# Patient Record
Sex: Female | Born: 1995
Health system: Southern US, Community
[De-identification: ages and names within clinical notes are randomized; demographics above are authoritative.]

## PROBLEM LIST (undated history)

## (undated) DIAGNOSIS — R519 Headache, unspecified: Secondary | ICD-10-CM

## (undated) DIAGNOSIS — Z789 Other specified health status: Secondary | ICD-10-CM

## (undated) DIAGNOSIS — S329XXA Fracture of unspecified parts of lumbosacral spine and pelvis, initial encounter for closed fracture: Secondary | ICD-10-CM

## (undated) DIAGNOSIS — L509 Urticaria, unspecified: Secondary | ICD-10-CM

## (undated) DIAGNOSIS — N8000 Endometriosis of the uterus, unspecified: Secondary | ICD-10-CM

## (undated) DIAGNOSIS — I4891 Unspecified atrial fibrillation: Secondary | ICD-10-CM

## (undated) DIAGNOSIS — I10 Essential (primary) hypertension: Secondary | ICD-10-CM

## (undated) DIAGNOSIS — O210 Mild hyperemesis gravidarum: Secondary | ICD-10-CM

## (undated) HISTORY — PX: FOOT SURGERY: SHX648

## (undated) HISTORY — DX: Essential (primary) hypertension: I10

## (undated) HISTORY — DX: Urticaria, unspecified: L50.9

---
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
## 2014-06-04 ENCOUNTER — Inpatient Hospital Stay (HOSPITAL_COMMUNITY): Payer: Federal, State, Local not specified - PPO

## 2014-06-04 ENCOUNTER — Encounter (HOSPITAL_COMMUNITY): Payer: Self-pay | Admitting: *Deleted

## 2014-06-04 ENCOUNTER — Other Ambulatory Visit: Payer: Self-pay | Admitting: Obstetrics and Gynecology

## 2014-06-04 ENCOUNTER — Inpatient Hospital Stay (HOSPITAL_COMMUNITY)
Admission: AD | Admit: 2014-06-04 | Discharge: 2014-06-04 | Disposition: A | Discharge status: Home / Self Care | Payer: Federal, State, Local not specified - PPO | Source: Ambulatory Visit | Attending: Obstetrics and Gynecology | Admitting: Obstetrics and Gynecology

## 2014-06-04 DIAGNOSIS — R1030 Lower abdominal pain, unspecified: Secondary | ICD-10-CM

## 2014-06-04 DIAGNOSIS — Z975 Presence of (intrauterine) contraceptive device: Secondary | ICD-10-CM | POA: Diagnosis not present

## 2014-06-04 DIAGNOSIS — R109 Unspecified abdominal pain: Secondary | ICD-10-CM | POA: Insufficient documentation

## 2014-06-04 MED ORDER — MORPHINE SULFATE 4 MG/ML IJ SOLN
2.0000 mg | INTRAMUSCULAR | Status: DC | PRN
  Administered 2014-06-04: 2 mg via INTRAVENOUS
  Filled 2014-06-04: qty 1

## 2014-06-04 MED ORDER — PROMETHAZINE HCL 25 MG RE SUPP: 25.0000 mg | Freq: Four times a day (QID) | RECTAL | Status: DC | PRN

## 2014-06-04 MED ORDER — LACTATED RINGERS IV BOLUS (SEPSIS)
1000.0000 mL | Freq: Once | INTRAVENOUS | Status: AC
  Administered 2014-06-04: 1000 mL via INTRAVENOUS

## 2014-06-04 MED ORDER — PROMETHAZINE HCL 25 MG/ML IJ SOLN
25.0000 mg | Freq: Four times a day (QID) | INTRAMUSCULAR | Status: DC | PRN
  Administered 2014-06-04: 25 mg via INTRAVENOUS
  Filled 2014-06-04: qty 1

## 2014-06-04 MED ORDER — PROMETHAZINE HCL 25 MG PO TABS: 25.0000 mg | ORAL_TABLET | Freq: Four times a day (QID) | ORAL | Status: DC | PRN

## 2014-06-04 NOTE — MAU Note (Signed)
Pt presents from Dr. Ginnie SmartVarnado's office with abdominal pain following the placement of a Skyla IUD. States the pain started immediately when placed and is having spotting. Was given Tylenol and Toradol at 1245 but it isn't helping.

## 2014-06-04 NOTE — MAU Provider Note (Signed)
CC: Abdominal Pain    First Provider Initiated Contact with Patient 06/04/14 1200      HPI Luanna ColeBrook K Fairbairn is a 19 y.o. G0P0 who was sent from office visit after Skylar IUD insertion today. Following this insertion she began having pain, nausea and vomiting, diaphoresis and had syncopal episode while there. She received Tylenol and Toradol at 1245 at the office.    Past Medical History  Diagnosis Date  . Asthma   . Fractured pelvis     OB History  Gravida Para Term Preterm AB SAB TAB Ectopic Multiple Living  0 0 0 0 0 0 0 0 0 0         Past Surgical History  Procedure Laterality Date  . Foot surgery      History   Social History  . Marital Status: Single    Spouse Name: N/A    Number of Children: N/A  . Years of Education: N/A   Occupational History  . Not on file.   Social History Main Topics  . Smoking status: Never Smoker   . Smokeless tobacco: Not on file  . Alcohol Use: No  . Drug Use: No  . Sexual Activity: Yes    Birth Control/ Protection: IUD   Other Topics Concern  . Not on file   Social History Narrative    No current facility-administered medications on file prior to encounter.   Current Outpatient Prescriptions on File Prior to Encounter  Medication Sig Dispense Refill  . albuterol (PROVENTIL HFA;VENTOLIN HFA) 108 (90 BASE) MCG/ACT inhaler Inhale 2 puffs into the lungs every 6 (six) hours as needed. For shortness of breath    . ergocalciferol (VITAMIN D2) 50000 UNITS capsule Take 50,000 Units by mouth as directed. Patient takes 1 capsule every two weeks. No specific day.    . fluticasone (FLOVENT HFA) 220 MCG/ACT inhaler Inhale 1 puff into the lungs 2 (two) times daily as needed. For shortness of breath    . montelukast (SINGULAIR) 10 MG tablet Take 10 mg by mouth daily.    . promethazine (PHENERGAN) 25 MG tablet Take 1 tablet (25 mg total) by mouth every 6 (six) hours as needed for nausea. 12 tablet 0  . HYDROcodone-acetaminophen (NORCO/VICODIN)  5-325 MG per tablet Take 2 tablets by mouth every 4 (four) hours as needed for pain. (Patient not taking: Reported on 06/04/2014) 10 tablet 0    Allergies  Allergen Reactions  . Ibuprofen Nausea And Vomiting     ROS Pertinent items in HPI   PHYSICAL EXAM Filed Vitals:   06/04/14 1116  BP: 113/59  Pulse: 73  Temp: 98 F (36.7 C)  Resp: 73   General: Well nourished, well developed female in no acute distress Cardiovascular: Normal rate Respiratory: Normal effort Abdomen: Soft, nontender Back: No CVAT Extremities: No edema Neurologic:grossly intact  LAB RESULTS No results found for this or any previous visit (from the past 24 hour(s)).  IMAGING Prelim: IUD in correct place  MAU COURSE First saw pt after MSO4 2GM and Phenergan 25mg  IV give> somnolent Dr. Dion BodyVarnado to see pt and do disposition  ASSESSMENT  1. Abdominal pain     PLAN  Danae OrleansDeirdre C Poe, CNM 06/04/2014 12:08 PM

## 2014-06-04 NOTE — MAU Note (Signed)
Pt's mother reports pt "feel out" in Varnado's office today after IUD being placed.  Mother states she did have a syncopal episode but did not strike anything when she slid to the ground.  Pt continues to vomit clear/yellowish fluid.

## 2014-06-26 ENCOUNTER — Inpatient Hospital Stay (HOSPITAL_COMMUNITY)
Admission: AD | Admit: 2014-06-26 | Discharge: 2014-06-26 | Disposition: A | Discharge status: Home / Self Care | Payer: Federal, State, Local not specified - PPO | Source: Ambulatory Visit | Attending: Obstetrics and Gynecology | Admitting: Obstetrics and Gynecology

## 2014-06-26 ENCOUNTER — Encounter (HOSPITAL_COMMUNITY): Payer: Self-pay | Admitting: *Deleted

## 2014-06-26 DIAGNOSIS — R112 Nausea with vomiting, unspecified: Secondary | ICD-10-CM | POA: Insufficient documentation

## 2014-06-26 DIAGNOSIS — N946 Dysmenorrhea, unspecified: Secondary | ICD-10-CM | POA: Diagnosis not present

## 2014-06-26 DIAGNOSIS — R1030 Lower abdominal pain, unspecified: Secondary | ICD-10-CM | POA: Insufficient documentation

## 2014-06-26 DIAGNOSIS — Z975 Presence of (intrauterine) contraceptive device: Secondary | ICD-10-CM | POA: Insufficient documentation

## 2014-06-26 LAB — URINALYSIS, ROUTINE W REFLEX MICROSCOPIC
BILIRUBIN URINE: NEGATIVE
GLUCOSE, UA: NEGATIVE mg/dL
KETONES UR: NEGATIVE mg/dL
LEUKOCYTES UA: NEGATIVE
Nitrite: NEGATIVE
PH: 6 (ref 5.0–8.0)
Protein, ur: 30 mg/dL — AB
Urobilinogen, UA: 0.2 mg/dL (ref 0.0–1.0)

## 2014-06-26 LAB — URINE MICROSCOPIC-ADD ON

## 2014-06-26 LAB — POCT PREGNANCY, URINE: Preg Test, Ur: NEGATIVE

## 2014-06-26 MED ORDER — OXYCODONE-ACETAMINOPHEN 5-325 MG PO TABS: 1.0000 | ORAL_TABLET | ORAL | Status: DC | PRN

## 2014-06-26 MED ORDER — KETOROLAC TROMETHAMINE 10 MG PO TABS: 10.0000 mg | ORAL_TABLET | Freq: Four times a day (QID) | ORAL | Status: DC

## 2014-06-26 MED ORDER — PROMETHAZINE HCL 25 MG RE SUPP: 25.0000 mg | Freq: Four times a day (QID) | RECTAL | Status: DC | PRN

## 2014-06-26 MED ORDER — MEDROXYPROGESTERONE ACETATE 150 MG/ML IM SUSP
150.0000 mg | Freq: Once | INTRAMUSCULAR | Status: AC
  Administered 2014-06-26: 150 mg via INTRAMUSCULAR
  Filled 2014-06-26: qty 1

## 2014-06-26 MED ORDER — KETOROLAC TROMETHAMINE 60 MG/2ML IM SOLN
60.0000 mg | Freq: Once | INTRAMUSCULAR | Status: AC
  Administered 2014-06-26: 60 mg via INTRAMUSCULAR
  Filled 2014-06-26: qty 2

## 2014-06-26 MED ORDER — PROMETHAZINE HCL 25 MG/ML IJ SOLN
25.0000 mg | Freq: Once | INTRAMUSCULAR | Status: AC
  Administered 2014-06-26: 25 mg via INTRAVENOUS
  Filled 2014-06-26: qty 1

## 2014-06-26 MED ORDER — MORPHINE SULFATE 4 MG/ML IJ SOLN
4.0000 mg | Freq: Once | INTRAMUSCULAR | Status: AC
  Administered 2014-06-26: 4 mg via INTRAVENOUS
  Filled 2014-06-26: qty 1

## 2014-06-26 MED ORDER — MEDROXYPROGESTERONE ACETATE 150 MG/ML IM SUSP: 150.0000 mg | Freq: Once | INTRAMUSCULAR | Status: DC

## 2014-06-26 NOTE — MAU Provider Note (Signed)
History     CSN: [id number]  Arrival date & time 06/26/14  [id number]   First Provider Initiated Contact with Patient 06/26/14 1949      No chief complaint on file.   HPI 19 y/o G0 presents with c/o lower abdominal pain and nausea.    Pt has a h/o severe dysmenorrhea which includes intractable vomiting and severe abdominal pain.  Pt was amenorrheic on Depo Provera but did not like side effects.  Recommended Mirena IUD.  IUD was placed on 06/04/2014 without complication but pt had severe pain, menstrual cramps, and vomiting.  Pt was diaphoretic in office.  Pt had been premedicated with Valium, Percocet and Cytotec at that time. Toradol 60 mg given in office without relief of pain. Pt sent to ER.  Pain and nausea resolved with Morphine and Phenergan.  [facility] done at [facility] confirmed adequate placement, no perforation. Pt was seen in office 1 week after placement.  She reported occasional cramping but that was controlled with Midol.  Strings shorter than expected but pt was without uterine tenderness. Pt reports that the cramping increased 5 days ago but was controlled with Midol.  Today while driving, she had a sudden onset of pain and nausea and cramping resumed.  Pt states she contemplated calling the ER due to the severity of the pain.  Denies fever/chills.   Agreeable to removing IUD and resuming Depo Provera.  Needs contraception.  Past Medical History  Diagnosis Date  . Asthma   . Fractured pelvis     Past Surgical History  Procedure Laterality Date  . Foot surgery      History reviewed. No pertinent family history.  History  Substance Use Topics  . Smoking status: Never Smoker   . Smokeless tobacco: Not on file  . Alcohol Use: No    OB History    Gravida Para Term Preterm AB TAB SAB Ectopic Multiple Living   0 0 0 0 0 0 0 0 0 0       Review of Systems  Allergies  Ibuprofen  Home Medications  No current outpatient prescriptions on file.  BP 136/83 mmHg   Pulse 108  Temp(Src) 98.1 F (36.7 C) (Oral)  Resp 19  SpO2 100%  LMP  (LMP Unknown)  Physical Exam  Constitutional: She is oriented to person, place, and time. She appears well-developed and well-nourished. She appears distressed.  Appears to have cyclic pain.  Vomiting.  Shivering.  HENT:  Head: Normocephalic and atraumatic.  Neck: Normal range of motion.  Abdominal: Soft. There is tenderness. There is no rebound and no guarding.  Genitourinary: There is no rash or lesion on the right labia. There is no rash or lesion on the left labia. Cervix exhibits no discharge and no friability. No vaginal discharge found.  Bimanual exam deferred.  Neurological: She is alert and oriented to person, place, and time.  Skin: Skin is warm and dry.    MAU Course  Procedures (including critical care time)  I personally did a limited bedside ultrasound done..  Small uterus noted with IUD correctly positioned in endometrial cavity.  No free fluid.  Labs Reviewed  URINALYSIS, ROUTINE W REFLEX MICROSCOPIC - Abnormal; Notable for the following:    Specific Gravity, Urine >1.030 (*)    Hgb urine dipstick LARGE (*)    Protein, ur 30 (*)    All other components within normal limits  URINE MICROSCOPIC-ADD ON - Abnormal; Notable for the following:    Squamous Epithelial /  LPF FEW (*)    Bacteria, UA FEW (*)    All other components within normal limits  POCT PREGNANCY, URINE   No results found.   No diagnosis found.    MDM  Premedicate pt with Toradol 60 mg IM and Phenergan 25 mg IV.  Morphine Sulfate IV if pain s/p removal. Check GC/Chl.   Pt felt that the pain was bearable after the Phenergan and Toradol but she vomited shortly after getting IV Phenergan.  Pt given Morphine 4 mg IV prior to IUD removal.  Pt had pain with a speculum exam, which is pt's baseline.  IUD strings not easily visualized.  Ringed forceps used to blindly grasp strings.  IUD easily removed. Pt wit moderate to severe  pain but pt seemed to appear more comfortable within 5 minutes of removal.    A: Severe Dysmenorrhea exacerbated by IUD. Now s/p removal.   Nausea/vomiting.  P: Discharge home with po Toradol 10 mg q 6 hours x 2 days prn pain. Percocet #10 prn severe pain. Phenergan 25 mg suppositories.  Pt with h/o vomiting Zofran ODT. Depo Provera prior to discharge to defer menses and contraception.  Pt previously tolerated prolonged vaginal bleeding without cramping. F/u in office in 1 week. Plan diagnostic laparoscopy to rule out endometriosis.

## 2014-06-26 NOTE — Discharge Instructions (Signed)

## 2014-06-26 NOTE — MAU Note (Signed)
Pt reports she had an IUD put in on 06/04/2014, states she started having vomiting 45 minutes ago, lower abd pain started 45 minutes ago. States she was seen here right after the IUD was put in and was seen here the day it was placed.

## 2014-06-26 NOTE — MAU Note (Signed)
PT SAYS  SHE HAD  DEPO IN NOV 2015-   THEN  DR VARNADO   PUT  IN IUD ON 2-1-   PT  CAME  TO HOSPITAL  ON 2-1.  WE GAVE  HER PHENERGAN  AND MORPHINE   THEN WENT  HOME.   AND SHE HAS CONTINUED  WITH MILD CRAMPS .  SHE WAS IN OFFICE ON Thursday- SAID STRING WAS SHORTER-   TOLD  TO RETURN IN 4 WEEKS-  TO RECHECK  STRING.     TODAY  CRAMPING  BECAME   WORSE AT 6PM-    SHE TOOK ZOFRAN AND  1 MIDOL-   VOMITED.      AT  HOME - SHE SAW BLOOD WHEN SHE WIPED-   SHE DID NOT    WEAR UNDERWEAR-   .   NO BLOOD ON PERINEUM.   SAYS SHE VOMITED IN TRIAGE-.  ATE LAST  AT 5PM.  - CHICKEN STRIPS.

## 2014-06-27 LAB — GC/CHLAMYDIA PROBE AMP (~~LOC~~) NOT AT ARMC
CHLAMYDIA, DNA PROBE: NEGATIVE
Neisseria Gonorrhea: NEGATIVE

## 2014-11-07 ENCOUNTER — Encounter (HOSPITAL_COMMUNITY): Payer: Self-pay | Admitting: Anesthesiology

## 2014-11-07 NOTE — H&P (Signed)
   History of Present Illness  General:  19 y/o presents to discuss diagnostic laparoscopy for severe dysmenorrhea. Pt use to go to ER regularly for pain medicaiton b/c pain was so bad. Depo Provera stopped menses and controlled symptoms. Trial of IUD caused severe pain like with menses previously and had to be removed in the ER. Pt is now back on Depo. suspect endometriosis. Appetite is decreased, lost 15 lbs since skyla. Nausea with eating, feels likes needs to eat slowly. Feels full faster. Has not taken OTC medications.   Current Medications  Taking   Differin(Adapalene) 0.1 % Gel 1 application to affected area at bedtime Once a day, Notes: prn   Clindamycin Phosphate 1 % Gel 1 application a thin film to affected area Twice a day, Notes: prn   Depo-Provera(Medroxyprogesterone Acetate) 150 MG/ML Suspension 1 ml Every 3 months   Medication List reviewed and reconciled with the patient    Past Medical History  Liver disorder-abnormal LFTs   Surgical History  surgery on both feet 2012   Family History  Mother: alive, diagnosed with HTN  Maternal Grand Mother: diagnosed with Breast Ca   Social History  General:  Tobacco use  cigarettes: Never smoked Tobacco history last updated 11/02/2014 no Smoking.  no Alcohol.  Caffeine: yes, soda, coffee, tea.  no Recreational drug use.  Exercise: yes.  Occupation: employed, [employer].  Education: yes.  Marital Status: single.  Children: none.    Gyn History  Sexual activity currently sexually active.  LMP none with Depo.  Birth control Depo Provera.  Denies H/O Last pap smear date.  Denies H/O Last mammogram date.  Denies H/O Abnormal pap smear.  Denies H/O STD.    OB History  Never been pregnant per patient.    Allergies  Ibuprofen: pain   Hospitalization/Major Diagnostic Procedure  Denies Past Hospitalization   Review of Systems  Denies fever/chills, chest pain, SOB, headaches, numbness/tingling. No h/o  complication with anesthesia, bleeding disorders or blood clots.   Vital Signs  Wt 158, Wt change -16 lb, Ht 65.5, BMI 25.89, Pulse sitting 121, BP sitting 106/72.   Physical Examination  GENERAL:  Patient appears alert and oriented.  General Appearance: well-appearing, well-developed, no acute distress.  Speech: clear.  LUNGS:  Auscultation: no wheezing/rhonchi/rales. CTA bilaterally.  HEART:  Heart sounds: normal. RRR. no murmur.  ABDOMEN:  General: soft nontender, nondistended, no masses.  FEMALE GENITOURINARY:  Pelvic Not examined.  EXTREMITIES:  General: No edema or calf tenderness.     Assessments   1. Pre-operative clearance - Z01.818 (Primary)   2. Dysmenorrhea - N94.6   Treatment  1. Pre-operative clearance  Notes: D/w pt likelihood of severe adhesion which may limit what can be done at time of surgery. Referral if Stage 3 or 4 endometriosis noted. Risk of injury, especially due to severe adhesive disease reviewed at length. Informed of possible ablation of endometriosis if present.    2. Dysmenorrhea  Referral ZO:XWRUEATo:[facility] - Gynecology Reason:Precert Dx laparoscopy, possible ablation of endometriosis.Need assistant, no preop or surgical clearance needed    Follow Up  2 Weeks post op

## 2014-11-07 NOTE — Anesthesia Preprocedure Evaluation (Addendum)
Anesthesia Evaluation  Patient identified by MRN, date of birth, ID band Patient awake    Reviewed: Allergy & Precautions, NPO status , Patient's Chart, lab work & pertinent test results  Airway Mallampati: II  TM Distance: >3 FB Neck ROM: Full    Dental no notable dental hx. (+) Dental Advisory Given   Pulmonary asthma ,  breath sounds clear to auscultation  Pulmonary exam normal       Cardiovascular negative cardio ROS Normal cardiovascular examRhythm:Regular Rate:Normal     Neuro/Psych negative neurological ROS  negative psych ROS   GI/Hepatic negative GI ROS, Neg liver ROS,   Endo/Other  negative endocrine ROS  Renal/GU negative Renal ROS  negative genitourinary   Musculoskeletal Hx/o Fx pelvis   Abdominal   Peds  Hematology   Anesthesia Other Findings   Reproductive/Obstetrics Dysmenorrhea                            Anesthesia Physical Anesthesia Plan  ASA: II  Anesthesia Plan: General   Post-op Pain Management:    Induction: Intravenous  Airway Management Planned: Oral ETT  Additional Equipment:   Intra-op Plan:   Post-operative Plan: Extubation in OR  Informed Consent: I have reviewed the patients History and Physical, chart, labs and discussed the procedure including the risks, benefits and alternatives for the proposed anesthesia with the patient or authorized representative who has indicated his/her understanding and acceptance.   Dental advisory given  Plan Discussed with: Anesthesiologist, CRNA and Surgeon  Anesthesia Plan Comments:        Anesthesia Quick Evaluation

## 2014-11-08 ENCOUNTER — Ambulatory Visit (HOSPITAL_COMMUNITY): Payer: Federal, State, Local not specified - PPO | Admitting: Anesthesiology

## 2014-11-08 ENCOUNTER — Encounter (HOSPITAL_COMMUNITY): Payer: Self-pay | Admitting: *Deleted

## 2014-11-08 ENCOUNTER — Encounter (HOSPITAL_COMMUNITY)
Admission: RE | Disposition: A | Discharge status: Home / Self Care | Payer: Self-pay | Source: Ambulatory Visit | Attending: Obstetrics and Gynecology

## 2014-11-08 ENCOUNTER — Ambulatory Visit (HOSPITAL_COMMUNITY)
Admission: RE | Admit: 2014-11-08 | Discharge: 2014-11-08 | Disposition: A | Discharge status: Home / Self Care | Payer: Federal, State, Local not specified - PPO | Source: Ambulatory Visit | Attending: Obstetrics and Gynecology | Admitting: Obstetrics and Gynecology

## 2014-11-08 DIAGNOSIS — J45909 Unspecified asthma, uncomplicated: Secondary | ICD-10-CM | POA: Diagnosis not present

## 2014-11-08 DIAGNOSIS — Z886 Allergy status to analgesic agent status: Secondary | ICD-10-CM | POA: Insufficient documentation

## 2014-11-08 DIAGNOSIS — N802 Endometriosis of fallopian tube: Secondary | ICD-10-CM | POA: Diagnosis not present

## 2014-11-08 DIAGNOSIS — N803 Endometriosis of pelvic peritoneum: Secondary | ICD-10-CM | POA: Insufficient documentation

## 2014-11-08 DIAGNOSIS — N946 Dysmenorrhea, unspecified: Secondary | ICD-10-CM | POA: Diagnosis present

## 2014-11-08 HISTORY — DX: Other specified health status: Z78.9

## 2014-11-08 HISTORY — PX: LAPAROSCOPY: SHX197

## 2014-11-08 HISTORY — PX: CHROMOPERTUBATION: SHX6288

## 2014-11-08 HISTORY — PX: ABLATION ON ENDOMETRIOSIS: SHX5787

## 2014-11-08 LAB — ABO/RH: ABO/RH(D): O POS

## 2014-11-08 LAB — PREGNANCY, URINE: PREG TEST UR: NEGATIVE

## 2014-11-08 LAB — CBC
HCT: 38.4 % (ref 36.0–46.0)
Hemoglobin: 13.2 g/dL (ref 12.0–15.0)
MCH: 26.2 pg (ref 26.0–34.0)
MCHC: 34.4 g/dL (ref 30.0–36.0)
MCV: 76.2 fL — AB (ref 78.0–100.0)
Platelets: 295 10*3/uL (ref 150–400)
RBC: 5.04 MIL/uL (ref 3.87–5.11)
RDW: 14.3 % (ref 11.5–15.5)
WBC: 9.7 10*3/uL (ref 4.0–10.5)

## 2014-11-08 LAB — TYPE AND SCREEN
ABO/RH(D): O POS
ANTIBODY SCREEN: NEGATIVE

## 2014-11-08 SURGERY — LAPAROSCOPY, DIAGNOSTIC
Anesthesia: General

## 2014-11-08 MED ORDER — ONDANSETRON HCL 4 MG/2ML IJ SOLN
INTRAMUSCULAR | Status: AC
  Filled 2014-11-08: qty 2

## 2014-11-08 MED ORDER — DEXAMETHASONE SODIUM PHOSPHATE 4 MG/ML IJ SOLN
INTRAMUSCULAR | Status: AC
  Filled 2014-11-08: qty 1

## 2014-11-08 MED ORDER — ROCURONIUM BROMIDE 100 MG/10ML IV SOLN
INTRAVENOUS | Status: AC
  Filled 2014-11-08: qty 1

## 2014-11-08 MED ORDER — KETOROLAC TROMETHAMINE 30 MG/ML IJ SOLN
30.0000 mg | Freq: Once | INTRAMUSCULAR | Status: AC
  Administered 2014-11-08: 30 mg via INTRAVENOUS

## 2014-11-08 MED ORDER — KETOROLAC TROMETHAMINE 30 MG/ML IJ SOLN
INTRAMUSCULAR | Status: AC
  Filled 2014-11-08: qty 1

## 2014-11-08 MED ORDER — GLYCOPYRROLATE 0.2 MG/ML IJ SOLN
INTRAMUSCULAR | Status: AC
Start: 2014-11-08 — End: 2014-11-08
  Filled 2014-11-08: qty 3

## 2014-11-08 MED ORDER — METHYLENE BLUE 1 % INJ SOLN
INTRAMUSCULAR | Status: AC
  Filled 2014-11-08: qty 1

## 2014-11-08 MED ORDER — MIDAZOLAM HCL 2 MG/2ML IJ SOLN
INTRAMUSCULAR | Status: AC
  Filled 2014-11-08: qty 2

## 2014-11-08 MED ORDER — SCOPOLAMINE 1 MG/3DAYS TD PT72
MEDICATED_PATCH | TRANSDERMAL | Status: AC
  Administered 2014-11-08: 1.5 mg via TRANSDERMAL
  Filled 2014-11-08: qty 1

## 2014-11-08 MED ORDER — BUPIVACAINE HCL (PF) 0.25 % IJ SOLN
INTRAMUSCULAR | Status: AC
  Filled 2014-11-08: qty 30

## 2014-11-08 MED ORDER — ROCURONIUM BROMIDE 100 MG/10ML IV SOLN
INTRAVENOUS | Status: DC | PRN
  Administered 2014-11-08: 30 mg via INTRAVENOUS

## 2014-11-08 MED ORDER — BUPIVACAINE HCL (PF) 0.25 % IJ SOLN
INTRAMUSCULAR | Status: DC | PRN
  Administered 2014-11-08: 10 mL
  Administered 2014-11-08: 17 mL

## 2014-11-08 MED ORDER — CEFAZOLIN SODIUM-DEXTROSE 2-3 GM-% IV SOLR
INTRAVENOUS | Status: AC
  Filled 2014-11-08: qty 50

## 2014-11-08 MED ORDER — LABETALOL HCL 5 MG/ML IV SOLN
INTRAVENOUS | Status: DC | PRN
  Administered 2014-11-08: 5 mg via INTRAVENOUS

## 2014-11-08 MED ORDER — GLYCOPYRROLATE 0.2 MG/ML IJ SOLN
INTRAMUSCULAR | Status: DC | PRN
  Administered 2014-11-08: 0.4 mg via INTRAVENOUS

## 2014-11-08 MED ORDER — SILVER NITRATE-POT NITRATE 75-25 % EX MISC
CUTANEOUS | Status: AC
  Filled 2014-11-08: qty 1

## 2014-11-08 MED ORDER — FENTANYL CITRATE (PF) 100 MCG/2ML IJ SOLN
25.0000 ug | INTRAMUSCULAR | Status: DC | PRN
  Administered 2014-11-08 (×2): 50 ug via INTRAVENOUS

## 2014-11-08 MED ORDER — FENTANYL CITRATE (PF) 100 MCG/2ML IJ SOLN
INTRAMUSCULAR | Status: DC | PRN
  Administered 2014-11-08: 100 ug via INTRAVENOUS
  Administered 2014-11-08: 50 ug via INTRAVENOUS
  Administered 2014-11-08: 100 ug via INTRAVENOUS

## 2014-11-08 MED ORDER — SODIUM CHLORIDE 0.9 % IJ SOLN
INTRAMUSCULAR | Status: AC
  Filled 2014-11-08: qty 100

## 2014-11-08 MED ORDER — OXYCODONE-ACETAMINOPHEN 5-325 MG PO TABS: 1.0000 | ORAL_TABLET | ORAL | Status: DC | PRN

## 2014-11-08 MED ORDER — ACETAMINOPHEN 10 MG/ML IV SOLN
1000.0000 mg | Freq: Once | INTRAVENOUS | Status: AC
  Administered 2014-11-08: 1000 mg via INTRAVENOUS
  Filled 2014-11-08: qty 100

## 2014-11-08 MED ORDER — ONDANSETRON HCL 4 MG/2ML IJ SOLN
INTRAMUSCULAR | Status: DC | PRN
  Administered 2014-11-08: 4 mg via INTRAVENOUS

## 2014-11-08 MED ORDER — LIDOCAINE HCL (CARDIAC) 20 MG/ML IV SOLN
INTRAVENOUS | Status: DC | PRN
  Administered 2014-11-08: 100 mg via INTRAVENOUS

## 2014-11-08 MED ORDER — LACTATED RINGERS IV SOLN
INTRAVENOUS | Status: DC
  Administered 2014-11-08 (×2): via INTRAVENOUS

## 2014-11-08 MED ORDER — FENTANYL CITRATE (PF) 100 MCG/2ML IJ SOLN
INTRAMUSCULAR | Status: AC
  Filled 2014-11-08: qty 2

## 2014-11-08 MED ORDER — NEOSTIGMINE METHYLSULFATE 10 MG/10ML IV SOLN
INTRAVENOUS | Status: AC
  Filled 2014-11-08: qty 1

## 2014-11-08 MED ORDER — NEOSTIGMINE METHYLSULFATE 10 MG/10ML IV SOLN
INTRAVENOUS | Status: DC | PRN
  Administered 2014-11-08: 2 mg via INTRAVENOUS

## 2014-11-08 MED ORDER — FENTANYL CITRATE (PF) 250 MCG/5ML IJ SOLN
INTRAMUSCULAR | Status: AC
  Filled 2014-11-08: qty 5

## 2014-11-08 MED ORDER — ONDANSETRON HCL 4 MG/2ML IJ SOLN: 4.0000 mg | Freq: Once | INTRAMUSCULAR | Status: DC | PRN

## 2014-11-08 MED ORDER — PROPOFOL 10 MG/ML IV BOLUS
INTRAVENOUS | Status: DC | PRN
  Administered 2014-11-08: 170 mg via INTRAVENOUS

## 2014-11-08 MED ORDER — MIDAZOLAM HCL 2 MG/2ML IJ SOLN
INTRAMUSCULAR | Status: DC | PRN
  Administered 2014-11-08: 2 mg via INTRAVENOUS

## 2014-11-08 MED ORDER — DEXAMETHASONE SODIUM PHOSPHATE 10 MG/ML IJ SOLN
INTRAMUSCULAR | Status: DC | PRN
  Administered 2014-11-08: 4 mg via INTRAVENOUS

## 2014-11-08 MED ORDER — SCOPOLAMINE 1 MG/3DAYS TD PT72
1.0000 | MEDICATED_PATCH | Freq: Once | TRANSDERMAL | Status: DC
  Administered 2014-11-08: 1.5 mg via TRANSDERMAL

## 2014-11-08 MED ORDER — PROPOFOL 10 MG/ML IV BOLUS
INTRAVENOUS | Status: AC
Start: 2014-11-08 — End: 2014-11-08
  Filled 2014-11-08: qty 20

## 2014-11-08 MED ORDER — CEFAZOLIN SODIUM-DEXTROSE 2-3 GM-% IV SOLR
2.0000 g | INTRAVENOUS | Status: AC
  Administered 2014-11-08: 2 g via INTRAVENOUS

## 2014-11-08 MED ORDER — LIDOCAINE HCL (CARDIAC) 20 MG/ML IV SOLN
INTRAVENOUS | Status: AC
  Filled 2014-11-08: qty 5

## 2014-11-08 SURGICAL SUPPLY — 29 items
BENZOIN TINCTURE PRP APPL 2/3 (GAUZE/BANDAGES/DRESSINGS) IMPLANT
CABLE HIGH FREQUENCY MONO STRZ (ELECTRODE) ×3 IMPLANT
CHLORAPREP W/TINT 26ML (MISCELLANEOUS) ×3 IMPLANT
CLOTH BEACON ORANGE TIMEOUT ST (SAFETY) ×3 IMPLANT
DRSG COVADERM PLUS 2X2 (GAUZE/BANDAGES/DRESSINGS) ×3 IMPLANT
DRSG OPSITE POSTOP 3X4 (GAUZE/BANDAGES/DRESSINGS) ×6 IMPLANT
FORCEPS CUTTING 33CM 5MM (CUTTING FORCEPS) IMPLANT
GLOVE BIO SURGEON STRL SZ7 (GLOVE) ×3 IMPLANT
GLOVE BIOGEL PI IND STRL 7.0 (GLOVE) ×4 IMPLANT
GLOVE BIOGEL PI INDICATOR 7.0 (GLOVE) ×2
GOWN STRL REUS W/TWL LRG LVL3 (GOWN DISPOSABLE) ×9 IMPLANT
LIQUID BAND (GAUZE/BANDAGES/DRESSINGS) ×3 IMPLANT
PACK LAPAROSCOPY BASIN (CUSTOM PROCEDURE TRAY) ×3 IMPLANT
PAD POSITIONER PINK NONSTERILE (MISCELLANEOUS) ×3 IMPLANT
POUCH SPECIMEN RETRIEVAL 10MM (ENDOMECHANICALS) IMPLANT
SET IRRIG TUBING LAPAROSCOPIC (IRRIGATION / IRRIGATOR) IMPLANT
SLEEVE XCEL OPT CAN 5 100 (ENDOMECHANICALS) ×6 IMPLANT
STRIP CLOSURE SKIN 1/4X4 (GAUZE/BANDAGES/DRESSINGS) IMPLANT
SUT MNCRL AB 4-0 PS2 18 (SUTURE) ×3 IMPLANT
SUT PLAIN 2 0 (SUTURE)
SUT PLAIN ABS 2-0 CT1 27XMFL (SUTURE) IMPLANT
SUT VICRYL 0 UR6 27IN ABS (SUTURE) ×3 IMPLANT
SYRINGE 60CC LL (MISCELLANEOUS) ×3 IMPLANT
TOWEL OR 17X24 6PK STRL BLUE (TOWEL DISPOSABLE) ×6 IMPLANT
TRAY FOLEY CATH SILVER 14FR (SET/KITS/TRAYS/PACK) ×3 IMPLANT
TROCAR XCEL NON-BLD 11X100MML (ENDOMECHANICALS) IMPLANT
TROCAR XCEL NON-BLD 5MMX100MML (ENDOMECHANICALS) ×3 IMPLANT
WARMER LAPAROSCOPE (MISCELLANEOUS) ×3 IMPLANT
WATER STERILE IRR 1000ML POUR (IV SOLUTION) ×3 IMPLANT

## 2014-11-08 NOTE — Anesthesia Postprocedure Evaluation (Signed)
  Anesthesia Post-op Note  Patient: Joyce Franco  Procedure(s) Performed: Procedure(s) (LRB): LAPAROSCOPY DIAGNOSTIC with peritoneal  biopsy (N/A) CHROMOPERTUBATION ABLATION ON ENDOMETRIOSIS  Patient Location: PACU  Anesthesia Type: General  Level of Consciousness: awake and alert   Airway and Oxygen Therapy: Patient Spontanous Breathing  Post-op Pain: mild  Post-op Assessment: Post-op Vital signs reviewed, Patient's Cardiovascular Status Stable, Respiratory Function Stable, Patent Airway and No signs of Nausea or vomiting  Last Vitals:  Filed Vitals:   11/08/14 0833  BP: 133/80  Pulse: 93  Temp: 36.6 C  Resp: 14    Post-op Vital Signs: stable   Complications: No apparent anesthesia complications

## 2014-11-08 NOTE — Addendum Note (Signed)
Addendum  created 11/08/14 [id number] by Karie SchwalbeMary Ledarius Leeson, MD   Modules edited: Orders, PRL Based Order Sets

## 2014-11-08 NOTE — Interval H&P Note (Signed)
History and Physical Interval Note:  11/08/2014 6:29 AM  Luanna ColeBrook K Erno  has presented today for surgery, with the diagnosis of N94.6 Dysmenorrhea  The various methods of treatment have been discussed with the patient and family. After consideration of risks, benefits and other options for treatment, the patient has consented to  Procedure(s): LAPAROSCOPY DIAGNOSTIC (N/A) as a surgical intervention .  The patient's history has been reviewed, patient examined, no change in status, stable for surgery.  I have reviewed the patient's chart and labs.  Questions were answered to the patient's satisfaction.     Dion BodyVARNADO, Everhett Bozard

## 2014-11-08 NOTE — Discharge Instructions (Addendum)
Diagnostic Laparoscopy Laparoscopy is a surgical procedure. It is used to diagnose and treat diseases inside the belly (abdomen). It is usually a brief, common, and relatively simple procedure. The laparoscopeis a thin, lighted, pencil-sized instrument. It is like a telescope. It is inserted into your abdomen through a small cut (incision). Your caregiver can look at the organs inside your body through this instrument. He or she can see if there is anything abnormal. Laparoscopy can be done either in a hospital or outpatient clinic. You may be given a mild sedative to help you relax before the procedure. Once in the operating room, you will be given a drug to make you sleep (general anesthesia). Laparoscopy usually lasts less than 1 hour. After the procedure, you will be monitored in a recovery area until you are stable and doing well. Once you are home, it will take 2 to 3 days to fully recover. RISKS AND COMPLICATIONS  Laparoscopy has relatively few risks. Your caregiver will discuss the risks with you before the procedure. Some problems that can occur include:  Infection.  Bleeding.  Damage to other organs.  Anesthetic side effects. PROCEDURE Once you receive anesthesia, your surgeon inflates the abdomen with a harmless gas (carbon dioxide). This makes the organs easier to see. The laparoscope is inserted into the abdomen through a small incision. This allows your surgeon to see into the abdomen. Other small instruments are also inserted into the abdomen through other small openings. Many surgeons attach a video camera to the laparoscope to enlarge the view. During a diagnostic laparoscopy, the surgeon may be looking for inflammation, infection, or cancer. Your surgeon may take tissue samples(biopsies). The samples are sent to a specialist in looking at cells and tissue samples (pathologist). The pathologist examines them under a microscope. Biopsies can help to diagnose or confirm a  disease. AFTER THE PROCEDURE   The gas is released from inside the abdomen.  The incisions are closed with stitches (sutures). Because these incisions are small (usually less than 1/2 inch), there is usually minimal discomfort after the procedure. There may be some mild discomfort in the throat. This is from the tube placed in the throat while you were sleeping. You may have some mild abdominal discomfort. There may also be discomfort from the instrument placement incisions in the abdomen.  The recovery time is shortened as long as there are no complications.  You will rest in a recovery room until stable and doing well. As long as there are no complications, you may be allowed to go home. FINDING OUT THE RESULTS OF YOUR TEST Not all test results are available during your visit. If your test results are not back during the visit, make an appointment with your caregiver to find out the results. Do not assume everything is normal if you have not heard from your caregiver or the medical facility. It is important for you to follow up on all of your test results. HOME CARE INSTRUCTIONS   Take all medicines as directed.  Only take over-the-counter or prescription medicines for pain, discomfort, or fever as directed by your caregiver.  Resume daily activities as directed.  Showers are preferred over baths.  You may resume sexual activities in 1 week or as directed.  Do not drive while taking narcotics. SEEK MEDICAL CARE IF:   There is increasing abdominal pain.  There is new pain in the shoulders (shoulder strap areas).  You feel lightheaded or faint.  You have the chills.  You or  your child has an oral temperature above 102 F (38.9 C).  There is pus-like (purulent) drainage from any of the wounds.  You are unable to pass gas or have a bowel movement.  You feel sick to your stomach (nauseous) or throw up (vomit). MAKE SURE YOU:   Understand these instructions.  Will watch  your condition.  Will get help right away if you are not doing well or get worse. Document Released: 07/27/2000 Document Revised: 08/15/2012 Document Reviewed: 04/20/2007 [facility] Patient Information 2015 [place]. This information is not intended to replace advice given to you by your health care provider. Make sure you discuss any questions you have with your health care provider.   DISCHARGE INSTRUCTIONS: Laparoscopy  The following instructions have been prepared to help you care for yourself upon your return home today.  MAY REMOVE THE PATCH BEHIND YOUR EAR ON 11/10/14 WITH A TISSUE.  WASH HANDS VIGOROUSLY AFTER REMOVAL. MAY TAKE ALEVE AFTER 3:10 PM FOR PAIN!!!  Wound care:  Do not get the incision wet for the first 24 hours. The incision should be kept clean and dry.  The Band-Aids or dressings may be removed the day after surgery.  Should the incision become sore, red, and swollen after the first week, check with your doctor.  Personal hygiene:  Shower the day after your procedure.  Activity and limitations:  Do NOT drive or operate any equipment today.  Do NOT lift anything more than 15 pounds for 2-3 weeks after surgery.  Do NOT rest in bed all day.  Walking is encouraged. Walk each day, starting slowly with 5-minute walks 3 or 4 times a day. Slowly increase the length of your walks.  Walk up and down stairs slowly.  Do NOT do strenuous activities, such as golfing, playing tennis, bowling, running, biking, weight lifting, gardening, mowing, or vacuuming for 2-4 weeks. Ask your doctor when it is okay to start.  Diet: Eat a light meal as desired this evening. You may resume your usual diet tomorrow.  Return to work: This is dependent on the type of work you do. For the most part you can return to a desk job within a week of surgery. If you are more active at work, please discuss this with your doctor.  What to expect after your surgery: You may have a slight  burning sensation when you urinate on the first day. You may have a very small amount of blood in the urine. Expect to have a small amount of vaginal discharge/light bleeding for 1-2 weeks. It is not unusual to have abdominal soreness and bruising for up to 2 weeks. You may be tired and need more rest for about 1 week. You may experience shoulder pain for 24-72 hours. Lying flat in bed may relieve it.  Call your doctor for any of the following:  Develop a fever of 100.4 or greater  Inability to urinate 6 hours after discharge from hospital  Severe pain not relieved by pain medications  Persistent of heavy bleeding at incision site  Redness or swelling around incision site after a week  Increasing nausea or vomiting  Patient Signature________________________________________ Nurse Signature_________________________________________

## 2014-11-08 NOTE — Anesthesia Procedure Notes (Signed)
Procedure Name: Intubation Date/Time: 11/08/2014 7:25 AM Performed by: Elgie CongoMALINOVA, Kodi Steil H Pre-anesthesia Checklist: Patient identified, Emergency Drugs available, Suction available and Patient being monitored Patient Re-evaluated:Patient Re-evaluated prior to inductionOxygen Delivery Method: Circle system utilized Preoxygenation: Pre-oxygenation with 100% oxygen Intubation Type: IV induction Ventilation: Mask ventilation without difficulty Laryngoscope Size: Mac and 3 Grade View: Grade I Tube type: Oral Tube size: 7.0 mm Number of attempts: 1 Airway Equipment and Method: Stylet Placement Confirmation: ETT inserted through vocal cords under direct vision,  positive ETCO2 and breath sounds checked- equal and bilateral Secured at: 21 cm Tube secured with: Tape

## 2014-11-08 NOTE — Transfer of Care (Signed)
Immediate Anesthesia Transfer of Care Note  Patient: Joyce Franco  Procedure(s) Performed: Procedure(s): LAPAROSCOPY DIAGNOSTIC with peritoneal  biopsy (N/A) CHROMOPERTUBATION ABLATION ON ENDOMETRIOSIS  Patient Location: PACU  Anesthesia Type:General  Level of Consciousness: awake, alert  and oriented  Airway & Oxygen Therapy: Patient Spontanous Breathing and Patient connected to nasal cannula oxygen  Post-op Assessment: Report given to RN, Post -op Vital signs reviewed and stable and Patient moving all extremities  Post vital signs: Reviewed and stable  Last Vitals:  Filed Vitals:   11/08/14 0615  BP: 122/70  Pulse: 87  Temp: 37.2 C  Resp: 16    Complications: No apparent anesthesia complications

## 2014-11-08 NOTE — Brief Op Note (Signed)
11/08/2014  8:29 AM  PATIENT:  Joyce Franco  18 y.o. female  PRE-OPERATIVE DIAGNOSIS:  N94.6 Dysmenorrhea  POST-OPERATIVE DIAGNOSIS:  Endometriosis  PROCEDURE:  Procedure(s): LAPAROSCOPY DIAGNOSTIC with peritoneal  biopsy (N/A) CHROMOPERTUBATION ABLATION ON ENDOMETRIOSIS  SURGEON:  Surgeon(s) and Role:    * Geryl RankinsEvelyn Aneshia Jacquet, MD - Primary    * Myna HidalgoJennifer Ozan, DO - Assisting  PHYSICIAN ASSISTANT:   ASSISTANTS: Dr. Charlotta Newtonzan   ANESTHESIA:   local and general  EBL:  Total I/O In: 1000 [I.V.:1000] Out: 30 [Urine:25; Blood:5]  BLOOD ADMINISTERED:none  DRAINS: Urinary Catheter (Foley)   LOCAL MEDICATIONS USED:  MARCAINE     SPECIMEN:  Source of Specimen:  left culdesac peritoneal biospy, endometriosis left fallopian tube  DISPOSITION OF SPECIMEN:  PATHOLOGY  COUNTS:  YES  TOURNIQUET:  * No tourniquets in log *  DICTATION: .Other Dictation: Dictation Number [id number]  PLAN OF CARE: Discharge to home after PACU  PATIENT DISPOSITION:  PACU - hemodynamically stable.   Delay start of Pharmacological VTE agent (>24hrs) due to surgical blood loss or risk of bleeding: yes

## 2014-11-09 ENCOUNTER — Encounter (HOSPITAL_COMMUNITY): Payer: Self-pay | Admitting: Obstetrics and Gynecology

## 2014-11-15 NOTE — Op Note (Signed)
NAMEMarland Kitchen  Joyce Franco, Joyce Franco NO.:  [id number]  MEDICAL RECORD NO.:  [id number]  LOCATION:  [facility]                          FACILITY:  [facility]  PHYSICIAN:  Pieter Partridge, MD   DATE OF BIRTH:  [birth year]  DATE OF PROCEDURE:  11/08/2014 DATE OF DISCHARGE:  11/08/2014                              OPERATIVE REPORT   PREOPERATIVE DIAGNOSIS:  Severe dysmenorrhea.  POSTOPERATIVE DIAGNOSIS:  Endometriosis.  PROCEDURE:  Diagnostic laparoscopy with peritoneal biopsy, chromopertubation, and ablation of endometriosis.  SURGEON:  Pieter Partridge, MD.  ASSISTANT:  Myna Hidalgo.  Dr. Charlotta Newton was needed for assistance due to anticipation of some severe adhesions, residents are not available at this hospital.  OTHER ASSISTANT:  Technician.  ANESTHESIA:  Local and general.  Local is 0.25% Marcaine.  EBL:  5.  BLOOD ADMINISTERED:  None.  DRAINS:  Foley.  SPECIMEN:  Left cul-de-sac, peritoneal biopsy, and endometriosis of the left fallopian tube.  DISPOSITION:  Specimen is to pathology.  COUNTS:  Correct.  DISPOSITION:  To PACU hemodynamically stable.  COMPLICATIONS:  None.  FINDINGS:  Normal stage I endometriosis.  Uterus was normal.  Ovaries and tubes were visible bilaterally of clubbed fallopian tubes, clubbed fimbriae, common bilateral spillage,  endometriosis of the left cul-de- sac, powder burn mark and discrete pinpoint areas of endometriosis on the left distal portion of the fallopian tube.  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  DESCRIPTION OF PROCEDURE:  The patient was identified in the holding area.  She was then taken to the operating room, underwent general anesthesia without complications.  She was then placed in the dorsal lithotomy position, prepped and draped in a normal sterile fashion. Time-out was taken.  A bimanual exam was done which revealed an anteverted uterus.  A Hulka uterine manipulator was  then placed.  An acorn manipulator was placed in the endocervix after grasping the anterior lip of the cervix with a single-tooth tenaculum.  Attention was turned to the abdomen.  A 5-mm incision was made in the infraumbilical fold.  After injecting 0.25% Marcaine, a 5-mm trocar was then inserted under direct visualization.  Intra-abdominal access was confirmed.  CO2 gas was used to insufflate the abdomen.  We then placed 2 ports under direct visualization after transilluminating and confirming position in the abdomen and marking in injected with 0.25% Marcaine.  The uterus was examined.  There was no scarring, no obvious adhesions, but there was loss of hyperemia of the peritoneum and loss of dilated blood vessels especially on the left-hand side.  The cul-de-sac appeared normal.  It was not obliterated, however, on the left-hand side, there was a powder burn mark that was excised with disposable scissors.  The area was tented up, and there was cut around it without cautery.  Once it was removed, the biopsy site was then cauterized successfully with the scissors.  There was an area of endometriosis on the left tube that was fulgrated with the tip of the scissors and was obliterated.  Only the tube mucosa was affected.  No other areas of endometriosis were noted.  The liver edge was normal.  There were no  adhesions above the liver.  Once the biopsy was removed, the methylene blue was injected and bilateral spillage was noted from both tubes within a reasonable time.  All trocars were removed under direct visualization.  CO2 gas was removed.  The subcutaneous space was reapproximated with 4-0 Monocryl and Dermabond was applied and dressings were applied.  The Acorn manipulator was removed from the uterus.  No bleeding noted from the cervix.  The patient received Ancef 2 g IV prior to the procedure.  All instrument, sponge, and needle counts were correct x3.  SCDs were on and operated  throughout the entire case.  The patient tolerated the procedure well.     Pieter PartridgeEvelyn B Delores Edelstein, MD     EBV/MEDQ  D:  11/15/2014  T:  11/15/2014  Job:  [id number]

## 2014-12-18 ENCOUNTER — Ambulatory Visit: Payer: Federal, State, Local not specified - PPO | Admitting: Physical Therapy

## 2014-12-19 ENCOUNTER — Ambulatory Visit: Payer: Federal, State, Local not specified - PPO | Attending: Family | Admitting: Physical Therapy

## 2014-12-19 ENCOUNTER — Encounter: Payer: Self-pay | Admitting: Physical Therapy

## 2014-12-19 DIAGNOSIS — N949 Unspecified condition associated with female genital organs and menstrual cycle: Secondary | ICD-10-CM | POA: Insufficient documentation

## 2014-12-19 DIAGNOSIS — R102 Pelvic and perineal pain: Secondary | ICD-10-CM

## 2014-12-19 DIAGNOSIS — N9489 Other specified conditions associated with female genital organs and menstrual cycle: Secondary | ICD-10-CM | POA: Insufficient documentation

## 2014-12-19 NOTE — Therapy (Signed)
[facility] [address] [place], [place], [address] Phone: [phone]   Fax:  [phone]  Physical Therapy Evaluation  Patient Details  Name: Joyce Franco MRN: [id number] Date of Birth: February 19, 1996 Referring Provider:  Morrison Old*  Encounter Date: 12/19/2014      PT End of Session - 12/19/14 1655    Visit Number 1   Date for PT Re-Evaluation 03/13/15   PT Start Time 1615   PT Stop Time 1655   PT Time Calculation (min) 40 min   Activity Tolerance Patient tolerated treatment well   Behavior During Therapy [facility] for tasks assessed/performed      Past Medical History  Diagnosis Date  . Asthma   . Fractured pelvis   . Medical history non-contributory     Past Surgical History  Procedure Laterality Date  . Foot surgery    . Laparoscopy N/A 11/08/2014    Procedure: LAPAROSCOPY DIAGNOSTIC with peritoneal  biopsy;  Surgeon: Geryl Rankins, MD;  Location: [facility];  Service: Gynecology;  Laterality: N/A;  . Chromopertubation  11/08/2014    Procedure: CHROMOPERTUBATION;  Surgeon: Geryl Rankins, MD;  Location: [facility];  Service: Gynecology;;  . Ablation on endometriosis  11/08/2014    Procedure: ABLATION ON ENDOMETRIOSIS;  Surgeon: Geryl Rankins, MD;  Location: [facility];  Service: Gynecology;;    There were no vitals filed for this visit.  Visit Diagnosis:  Perineal pain in female - Plan: PT plan of care cert/re-cert  High-tone pelvic floor dysfunction - Plan: PT plan of care cert/re-cert      Subjective Assessment - 12/19/14 1622    Subjective Patient reported heavy periods in High school and has had continued pain since then.  Patient had laproscopic surgery that showed endometiosis and possible pelvic congestion. Dr. Emelda Fear thought patient has spastic muscles.    Limitations Sitting   How long can you sit comfortably? sitting in a car   Patient Stated Goals reduce pain    Currently in Pain? Yes    Pain Score 6    Pain Location Abdomen   Pain Orientation Lower   Pain Descriptors / Indicators Aching   Pain Type Chronic pain   Pain Onset More than a month ago   Pain Frequency Constant   Aggravating Factors  intercourse, wearing tampon, vaginal exam, most painful with vaginal entry   Pain Relieving Factors heat   Multiple Pain Sites No            [facility] PT Assessment - 12/19/14 0001    Assessment   Medical Diagnosis R19.8 spastic pelvic floor syndrome   Onset Date/Surgical Date 05/04/08   Prior Therapy None   Precautions   Precautions None   Balance Screen   Has the patient fallen in the past 6 months No   Has the patient had a decrease in activity level because of a fear of falling?  No   Prior Function   Level of Independence Independent   Vocation Student   Leisure walking across campus   Cognition   Overall Cognitive Status Within Functional Limits for tasks assessed   Observation/Other Assessments   Focus on Therapeutic Outcomes (FOTO)  54% limitation   ROM / Strength   AROM / PROM / Strength AROM;Strength   AROM   Overall AROM Comments lumbar ROM is full;    Strength   Overall Strength Comments bil. hip flexion 4-/5   Palpation   Palpation comment quadratus, right abdominal wall, bil.  psoas                 Pelvic Floor Special Questions - 12/19/14 0001    Prior Pregnancies No   Currently Sexually Active Yes   Marinoff Scale discomfort that does not affect completion  entry is most painful   Urinary Leakage No   Urinary urgency No   Falling out feeling (prolapse) No   Skin Integrity Intact   Scar none   Perineal Body/Introitus  Normal   External Palpation No tenderness   Pelvic Floor Internal Exam Patient confirms identification and approved therapist to assess patient pelvic floor muscle strength and integrity   Exam Type Vaginal   Sensation very tender   Palpation tenderness in bilateral obturator internist, bilateral levator ani, and  introitus   Strength good squeeze, good lift, able to hold agaisnt strong resistance   Tone high tone                  PT Education - 12/19/14 1654    Education provided Yes   Education Details hip adductor, hamstring and piriformis stretch   Person(s) Educated Patient   Methods Explanation;Demonstration;Tactile cues;Verbal cues;Handout   Comprehension Returned demonstration;Verbalized understanding          PT Short Term Goals - 12/19/14 1703    PT SHORT TERM GOAL #1   Title indpendent with flexibility exercises   Baseline not educated yet   Time 4   Period Weeks   Status New   PT SHORT TERM GOAL #2   Title understand how to perform self perineal massage   Baseline not educated yet   Time 4   Period Weeks   Status New   PT SHORT TERM GOAL #3   Title understand how to perform diaphragmatic breathing   Time 4   Period Weeks   Status New   PT SHORT TERM GOAL #4   Title pain with sitting decreased >/= 4/10   Baseline pain level 6/10   Time 4   Period Weeks   Status New           PT Long Term Goals - 12/19/14 1705    PT LONG TERM GOAL #1   Title independent with HEP and how to progress herself   Baseline not educated yet   Time 12   Period Weeks   Status New   PT LONG TERM GOAL #2   Title pain with vaginal penetration decreased </= 3/10   Baseline pain level 6/10   Time 12   PT LONG TERM GOAL #3   Title sit with pain level </= 3/10   Baseline pain level 3/10   Time 12   Period Weeks   Status New               Plan - 12/19/14 1655    Clinical Impression Statement Patient is a 19 year old female with diagnosis of 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 Patient reports constant achy pain at level 6/10 iin lower abdominal area.  FOTO score is 54% limitation.  Bilateral hip flexion is 4-/5.  Pelvic floor strength is 4/5.  Palpable tenderness located in bilateral psoas, right abdominal wall, left  quadratus, bil. obturator internist, bil. levator ani, and introitus.  Patient has good movement of perineal body.  Pelvis in correct alignment.  Lumbar ROM is full.  Patient has pain with entrance of penis with vaginal entry and during a  vaginal exam.  Patient has pain with sitting especially whlie she was driving in a car.  Patent is not able to wear a tampon due to pain.  Patient has history of endometriosis and severe pain with menstraul cycle.  Patient would benefit from physical therapy to decrease pain, decrease muscle spasm, and improve relaxation of pelvic floor muscles with vaginal penetration.    Pt will benefit from skilled therapeutic intervention in order to improve on the following deficits Decreased activity tolerance;Decreased mobility;Decreased strength;Impaired flexibility;Pain;Increased muscle spasms;Decreased endurance   Rehab Potential Excellent   Clinical Impairments Affecting Rehab Potential None   PT Frequency 1x / week   PT Duration 12 weeks   PT Treatment/Interventions Cryotherapy;Electrical Stimulation;Ultrasound;Moist Heat;Therapeutic activities;Therapeutic exercise;Neuromuscular re-education;Manual techniques;Patient/family education;Passive range of motion   PT Next Visit Plan ultrasound to perineal area, soft tissue work, diaphragmatic breathing, flexibility exercises   PT Home Exercise Plan diaphragmatic breathing   Recommended Other Services none   Consulted and Agree with Plan of Care Patient         Problem List There are no active problems to display for this patient.   Omid Deardorff,PT 12/19/2014, 5:09 PM  [facility] [address] [place], [place], [address] Phone: [phone]   Fax:  [phone]

## 2014-12-19 NOTE — Patient Instructions (Signed)
Hip Adductor: Wall Stretch   Lie on back with hips against wall, back of thighs on wall. Keep legs up for 30 seconds therePull legs apart until stretch is felt in inner thighs. Hold _30__ seconds. Relax. Repeat _2__ times. Do __1_ times a day. Advanced: At end of stretch, rotate thighs outward.  Copyright  VHI. All rights reserved.   Butterfly, Sitting   Sit straight or with back against wall. Gently push knees toward floor. Hold 30___ seconds. Repeat _2__ times per session. Do ___ sessions per day.  Copyright  VHI. All rights reserved.   No slouching, no crossing legs. Sitting upright.   Posterior Hip: Chair Stretch   Sit in chair, right ankle on other thigh. Lean forearm onto knee until stretch is felt in back of hip. Hold _30__ seconds. Relax. Repeat _2__ times. Do _1__ times a day. Repeat on other leg.  Copyright  VHI. All rights reserved.  [facility] [address] [place], [place] [address] Phone # [phone] Fax [phone]

## 2015-01-01 ENCOUNTER — Ambulatory Visit: Payer: Federal, State, Local not specified - PPO | Admitting: Physical Therapy

## 2015-01-09 ENCOUNTER — Ambulatory Visit: Payer: Federal, State, Local not specified - PPO | Admitting: Physical Therapy

## 2015-01-17 ENCOUNTER — Ambulatory Visit: Payer: Federal, State, Local not specified - PPO | Attending: Family | Admitting: Physical Therapy

## 2015-01-17 DIAGNOSIS — N9489 Other specified conditions associated with female genital organs and menstrual cycle: Secondary | ICD-10-CM | POA: Insufficient documentation

## 2015-01-17 DIAGNOSIS — N949 Unspecified condition associated with female genital organs and menstrual cycle: Secondary | ICD-10-CM | POA: Insufficient documentation

## 2015-01-24 ENCOUNTER — Encounter: Payer: Federal, State, Local not specified - PPO | Admitting: Physical Therapy

## 2015-01-31 ENCOUNTER — Encounter: Payer: Self-pay | Admitting: Physical Therapy

## 2015-01-31 ENCOUNTER — Ambulatory Visit: Payer: Federal, State, Local not specified - PPO | Admitting: Physical Therapy

## 2015-01-31 DIAGNOSIS — N9489 Other specified conditions associated with female genital organs and menstrual cycle: Secondary | ICD-10-CM | POA: Diagnosis present

## 2015-01-31 DIAGNOSIS — R102 Pelvic and perineal pain: Secondary | ICD-10-CM

## 2015-01-31 DIAGNOSIS — N949 Unspecified condition associated with female genital organs and menstrual cycle: Secondary | ICD-10-CM

## 2015-01-31 NOTE — Therapy (Signed)
[facility] [address] [place], [place], [address] Phone: [phone]   Fax:  [phone]  Physical Therapy Treatment  Patient Details  Name: Joyce Franco MRN: [id number] Date of Birth: [birth year] Referring Provider:  Morrison Old*  Encounter Date: 01/31/2015      PT End of Session - 01/31/15 1623    Visit Number 2   Date for PT Re-Evaluation 03/13/15   Authorization Type Medicaid   Authorization Time Period 12/25/2014-03/18/1015   Authorization - Visit Number 1   Authorization - Number of Visits 12   PT Start Time 1620   PT Stop Time 1658   PT Time Calculation (min) 38 min   Activity Tolerance Patient tolerated treatment well   Behavior During Therapy [facility] for tasks assessed/performed      Past Medical History  Diagnosis Date  . Asthma   . Fractured pelvis   . Medical history non-contributory     Past Surgical History  Procedure Laterality Date  . Foot surgery    . Laparoscopy N/A 11/08/2014    Procedure: LAPAROSCOPY DIAGNOSTIC with peritoneal  biopsy;  Surgeon: Geryl Rankins, MD;  Location: [facility];  Service: Gynecology;  Laterality: N/A;  . Chromopertubation  11/08/2014    Procedure: CHROMOPERTUBATION;  Surgeon: Geryl Rankins, MD;  Location: [facility];  Service: Gynecology;;  . Ablation on endometriosis  11/08/2014    Procedure: ABLATION ON ENDOMETRIOSIS;  Surgeon: Geryl Rankins, MD;  Location: [facility];  Service: Gynecology;;    There were no vitals filed for this visit.  Visit Diagnosis:  Perineal pain in female  High-tone pelvic floor dysfunction      Subjective Assessment - 01/31/15 1623    Subjective No changes in pain.    Limitations Sitting   How long can you sit comfortably? sitting in a car   Patient Stated Goals reduce pain    Currently in Pain? Yes   Pain Score 4    Pain Location Abdomen   Pain Orientation Lower   Pain Descriptors / Indicators Aching   Pain Type Chronic  pain   Pain Onset More than a month ago   Aggravating Factors  intercourse, wearing tampon, vaginal exam, most painful with vaginal entry   Pain Relieving Factors heat   Multiple Pain Sites No                      Pelvic Floor Special Questions - 01/31/15 0001    Pelvic Floor Internal Exam Patient confirms identification and approved therapist to assess patient pelvic floor muscle strength and integrity   Exam Type Vaginal           [facility] Adult PT Treatment/Exercise - 01/31/15 0001    Manual Therapy   Manual Therapy Soft tissue mobilization;Myofascial release;Internal Pelvic Floor   Soft tissue mobilization abdominal area and diaphragm   Myofascial Release one hand on sacrum and other on lower abdomen    Internal Pelvic Floor attempted internal soft tissue work but too painful therefor performed perineal soft tissue work externally                PT Education - 01/31/15 1702    Education provided Yes   Education Details pelvic floor soft tissue work   [employer]) Educated Patient   Methods Explanation;Demonstration;Verbal cues;Handout   Comprehension Returned demonstration;Verbalized understanding          PT Short Term Goals - 01/31/15 1703    PT SHORT TERM  GOAL #1   Title indpendent with flexibility exercises   Baseline not educated yet   Time 4   Period Weeks   Status Achieved   PT SHORT TERM GOAL #2   Title understand how to perform self perineal massage   Baseline not educated yet   Time 4   Period Weeks   Status Achieved   PT SHORT TERM GOAL #3   Title understand how to perform diaphragmatic breathing   Time 4   Period Weeks   Status New   PT SHORT TERM GOAL #4   Title pain with sitting decreased >/= 4/10   Baseline pain level 6/10   Time 4   Period Weeks   Status New           PT Long Term Goals - 12/19/14 1705    PT LONG TERM GOAL #1   Title independent with HEP and how to progress herself   Baseline not educated yet    Time 12   Period Weeks   Status New   PT LONG TERM GOAL #2   Title pain with vaginal penetration decreased </= 3/10   Baseline pain level 6/10   Time 12   PT LONG TERM GOAL #3   Title sit with pain level </= 3/10   Baseline pain level 3/10   Time 12   Period Weeks   Status New               Plan - 01/31/15 1704    Clinical Impression Statement Patient is a 19 year old female with diagnosis 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 Patient has learned how to perform self perineal soft tissue work with very little pain but when therapist attempts internal work too musch pain.  Patient had difficulty with  getting to therapy due to school schedule and taking care of her mother.  Patient is independent with flexibility exercises.  Patient would benefit from physical therapy to reduce pain and relax vaginal muscles.    Pt will benefit from skilled therapeutic intervention in order to improve on the following deficits Decreased activity tolerance;Decreased mobility;Decreased strength;Impaired flexibility;Pain;Increased muscle spasms;Decreased endurance   Rehab Potential Excellent   Clinical Impairments Affecting Rehab Potential None   PT Frequency 1x / week   PT Duration 12 weeks   PT Treatment/Interventions Cryotherapy;Electrical Stimulation;Ultrasound;Moist Heat;Therapeutic activities;Therapeutic exercise;Neuromuscular re-education;Manual techniques;Patient/family education;Passive range of motion   PT Next Visit Plan ultrasound to perineal area, soft tissue work, diaphragmatic breathing, flexibility exercises   PT Home Exercise Plan diaphragmatic breathing   Consulted and Agree with Plan of Care Patient        Problem List There are no active problems to display for this patient.   GRAY,CHERYL,PT 01/31/2015, 5:09 PM  [facility] [address] [place], [place],  [address] Phone: [phone]   Fax:  [phone]

## 2015-01-31 NOTE — Patient Instructions (Signed)
STRETCHING THE PELVIC FLOOR MUSCLES NO DILATOR  Supplies . Vaginal lubricant . Mirror (optional) . Gloves (optional) Positioning . Start in a semi-reclined position with your head propped up. Bend your knees and place your thumb or finger at the vaginal opening. Procedure . Apply a moderate amount of lubricant on the outer skin of your vagina, the labia minora.  Apply additional lubricant to your finger. . Spread the skin away from the vaginal opening. Place the end of your finger at the opening. . Do a maximum contraction of the pelvic floor muscles. Tighten the vagina and the anus maximally and relax. . When you know they are relaxed, gently and slowly insert your finger into your vagina, directing your finger slightly downward, for 2-3 inches of insertion. . Relax and stretch the 6 o'clock position . Hold each stretch for _2 min__ and repeat __1_ time with rest breaks of _1__ seconds between each stretch. . Repeat the stretching in the 4 o'clock and 8 o'clock positions. . Total time should be _6__ minutes, _1__ x per day.  Note the amount of theme your were able to achieve and your tolerance to your finger in your vagina. . Once you have accomplished the techniques you may try them in standing with one foot resting on the tub, or in other positions.  This is a good stretch to do in the shower if you don't need to use lubricant.   [facility] [address], [address] [place], [place] [address] Phone # [phone] Fax [phone]  

## 2015-02-07 ENCOUNTER — Encounter: Payer: Federal, State, Local not specified - PPO | Admitting: Physical Therapy

## 2015-02-14 ENCOUNTER — Ambulatory Visit: Payer: Federal, State, Local not specified - PPO | Attending: Family | Admitting: Physical Therapy

## 2015-02-14 ENCOUNTER — Encounter: Payer: Self-pay | Admitting: Physical Therapy

## 2015-02-14 DIAGNOSIS — N9489 Other specified conditions associated with female genital organs and menstrual cycle: Secondary | ICD-10-CM | POA: Diagnosis present

## 2015-02-14 DIAGNOSIS — N949 Unspecified condition associated with female genital organs and menstrual cycle: Secondary | ICD-10-CM | POA: Insufficient documentation

## 2015-02-14 DIAGNOSIS — R102 Pelvic and perineal pain: Secondary | ICD-10-CM

## 2015-02-14 NOTE — Patient Instructions (Addendum)
Hook-Lying    Lie with hips and knees bent. Allow body's muscles to relax. Place hands on belly. Inhale slowly and deeply for _3__ seconds, so hands move up. Then take _3__ seconds to exhale. Repeat 10___ times. Do _3__ times a day.  Can do in sitting too. Copyright  VHI. All rights reserved.  [facility] [address] [place], [place] [address] Phone # [phone] Fax [phone]

## 2015-02-14 NOTE — Therapy (Addendum)
[facility] [address], [place], [place], [address] Phone: [phone]   Fax:  [phone]  Physical Therapy Treatment  Patient Details  Name: Joyce Franco MRN: [id number] Date of Birth: 1995-12-02 Referring Provider:  Wanita Chamberlain*  Encounter Date: 02/14/2015      PT End of Session - 02/14/15 1617    Visit Number 3   Date for PT Re-Evaluation 03/13/15   Authorization Type Medicaid   Authorization Time Period 12/25/2014-03/18/1015   Authorization - Visit Number 2   Authorization - Number of Visits 12   PT Start Time 1618   PT Stop Time 1657   PT Time Calculation (min) 39 min   Activity Tolerance Patient tolerated treatment well   Behavior During Therapy [facility] for tasks assessed/performed      Past Medical History  Diagnosis Date  . Asthma   . Fractured pelvis ([place])   . Medical history non-contributory     Past Surgical History  Procedure Laterality Date  . Foot surgery    . Laparoscopy N/A 11/08/2014    Procedure: LAPAROSCOPY DIAGNOSTIC with peritoneal  biopsy;  Surgeon: Thurnell Lose, MD;  Location: [place] [facility];  Service: Gynecology;  Laterality: N/A;  . Chromopertubation  11/08/2014    Procedure: CHROMOPERTUBATION;  Surgeon: Thurnell Lose, MD;  Location: [place] [facility];  Service: Gynecology;;  . Ablation on endometriosis  11/08/2014    Procedure: ABLATION ON ENDOMETRIOSIS;  Surgeon: Thurnell Lose, MD;  Location: [place] [facility];  Service: Gynecology;;    There were no vitals filed for this visit.  Visit Diagnosis:  Perineal pain in female  High-tone pelvic floor dysfunction      Subjective Assessment - 02/14/15 1621    Subjective I had a little tightness in my stomach area   Limitations Sitting   How long can you sit comfortably? sitting in a car   Patient Stated Goals reduce pain    Pain Score 5    Pain Location Abdomen   Pain Orientation Lower   Pain Descriptors / Indicators Aching   Pain Type  Chronic pain   Pain Frequency Constant   Aggravating Factors  intercourse, wearing tampon, vaginal exam, most painful with vaginal entry   Pain Relieving Factors heat   Multiple Pain Sites No                      Pelvic Floor Special Questions - 02/14/15 0001    Pelvic Floor Internal Exam Patient confirms identification and approved therapist to assess patient pelvic floor muscle strength and integrity   Exam Type Vaginal           [facility] Adult PT Treatment/Exercise - 02/14/15 0001    Modalities   Modalities Ultrasound   Ultrasound   Ultrasound Location perineal area with sleeve over ultrasound head   Ultrasound Parameters 41mz, 20%, 0.8 w/cm2, 8 min   Ultrasound Goals Pain   Manual Therapy   Manual Therapy Soft tissue mobilization;Internal Pelvic Floor   Soft tissue mobilization perineal body, bil. ischiocavernosu, bulbocavernosus, bil. levatore ane externally   Internal Pelvic Floor introitus, bil. sides of urethra                PT Education - 02/14/15 1656    Education Details diaphragmatic breathing   Person(s) Educated Patient   Methods Explanation;Demonstration;Verbal cues;Handout   Comprehension Returned demonstration;Verbalized understanding          PT Short Term Goals - 02/14/15 1622  PT SHORT TERM GOAL #1   Title indpendent with flexibility exercises   Baseline not educated yet   Time 4   Period Weeks   Status Achieved   PT SHORT TERM GOAL #2   Title understand how to perform self perineal massage   Baseline not educated yet   Time 4   Period Weeks   Status Achieved   PT SHORT TERM GOAL #3   Title understand how to perform diaphragmatic breathing   Time 4   Period Weeks   Status Achieved   PT SHORT TERM GOAL #4   Title pain with sitting decreased >/= 4/10   Baseline pain level 6/10   Time 4   Period Weeks   Status On-going  5/10 pain level           PT Long Term Goals - 12/19/14 1705    PT LONG TERM GOAL #1    Title independent with HEP and how to progress herself   Baseline not educated yet   Time 12   Period Weeks   Status New   PT LONG TERM GOAL #2   Title pain with vaginal penetration decreased </= 3/10   Baseline pain level 6/10   Time 12   PT LONG TERM GOAL #3   Title sit with pain level </= 3/10   Baseline pain level 3/10   Time 12   Period Weeks   Status New               Plan - 02/14/15 1656    Clinical Impression Statement Patient is a 67 year odl female with diagnosis of spastic pelvic floor syndrome. Patient is performing her stretches and self perineal work.  Patient just learned diaphragmatic breathing t orelax the pelvic floor.  Patient was able to tolerate soft tissue work to the introituse with tenderness located on left side of urethra.  Aftre therapy patient did not have pain. Patient wil benefit fromphysical therapy to reduce muslce trigger points and pain.    Pt will benefit from skilled therapeutic intervention in order to improve on the following deficits Decreased activity tolerance;Decreased mobility;Decreased strength;Impaired flexibility;Pain;Increased muscle spasms;Decreased endurance   Rehab Potential Excellent   Clinical Impairments Affecting Rehab Potential None   PT Frequency 1x / week   PT Duration 12 weeks   PT Treatment/Interventions Cryotherapy;Electrical Stimulation;Ultrasound;Moist Heat;Therapeutic activities;Therapeutic exercise;Neuromuscular re-education;Manual techniques;Patient/family education;Passive range of motion   PT Next Visit Plan ultrasound to perineal area, soft tissue work,    PT Home Exercise Plan progress as needed   Consulted and Agree with Plan of Care Patient        Problem List There are no active problems to display for this patient.   ,,PT 02/14/2015, 5:02 PM  [facility] [address], [place], [place], [address] Phone: [phone]   Fax:   [phone]     PHYSICAL THERAPY DISCHARGE SUMMARY  Visits from Start of Care: 3  Current functional level related to goals / functional outcomes: See above.  Patient has no-showed for her last 2 visits and has not called back to schedule any further visits.     Remaining deficits: See above.   Education / Equipment: HEP  Plan:  Patient goals were not met. Patient is being discharged due to not returning since the last visit. Thank you for the referral. Earlie Counts, PT 03/14/2015 8:10 AM   ?????

## 2015-02-21 ENCOUNTER — Encounter: Payer: Federal, State, Local not specified - PPO | Admitting: Physical Therapy

## 2015-02-21 ENCOUNTER — Ambulatory Visit: Payer: Federal, State, Local not specified - PPO | Admitting: Physical Therapy

## 2015-03-07 ENCOUNTER — Ambulatory Visit: Payer: Federal, State, Local not specified - PPO | Attending: Family | Admitting: Physical Therapy

## 2015-03-21 ENCOUNTER — Ambulatory Visit: Payer: Federal, State, Local not specified - PPO | Admitting: Physical Therapy

## 2015-04-04 ENCOUNTER — Encounter: Payer: Federal, State, Local not specified - PPO | Admitting: Physical Therapy

## 2015-05-07 ENCOUNTER — Encounter (HOSPITAL_COMMUNITY): Payer: Self-pay | Admitting: *Deleted

## 2015-05-07 ENCOUNTER — Inpatient Hospital Stay (HOSPITAL_COMMUNITY)
Admission: AD | Admit: 2015-05-07 | Discharge: 2015-05-07 | Disposition: A | Discharge status: Home / Self Care | Payer: Federal, State, Local not specified - PPO | Source: Ambulatory Visit | Attending: Obstetrics and Gynecology | Admitting: Obstetrics and Gynecology

## 2015-05-07 ENCOUNTER — Inpatient Hospital Stay (HOSPITAL_COMMUNITY): Payer: Federal, State, Local not specified - PPO

## 2015-05-07 DIAGNOSIS — R109 Unspecified abdominal pain: Secondary | ICD-10-CM | POA: Diagnosis present

## 2015-05-07 DIAGNOSIS — N809 Endometriosis, unspecified: Secondary | ICD-10-CM | POA: Diagnosis not present

## 2015-05-07 DIAGNOSIS — J45909 Unspecified asthma, uncomplicated: Secondary | ICD-10-CM | POA: Insufficient documentation

## 2015-05-07 DIAGNOSIS — R102 Pelvic and perineal pain: Secondary | ICD-10-CM

## 2015-05-07 HISTORY — DX: Endometriosis of uterus: N80.0

## 2015-05-07 HISTORY — DX: Endometriosis of the uterus, unspecified: N80.00

## 2015-05-07 LAB — URINALYSIS, ROUTINE W REFLEX MICROSCOPIC
BILIRUBIN URINE: NEGATIVE
GLUCOSE, UA: NEGATIVE mg/dL
KETONES UR: 40 mg/dL — AB
LEUKOCYTES UA: NEGATIVE
NITRITE: NEGATIVE
PROTEIN: NEGATIVE mg/dL
SPECIFIC GRAVITY, URINE: 1.02 (ref 1.005–1.030)
pH: 8 (ref 5.0–8.0)

## 2015-05-07 LAB — COMPREHENSIVE METABOLIC PANEL
ALT: 13 U/L — ABNORMAL LOW (ref 14–54)
AST: 22 U/L (ref 15–41)
Albumin: 4.4 g/dL (ref 3.5–5.0)
Alkaline Phosphatase: 53 U/L (ref 38–126)
Anion gap: 11 (ref 5–15)
BUN: 12 mg/dL (ref 6–20)
CO2: 24 mmol/L (ref 22–32)
Calcium: 9.3 mg/dL (ref 8.9–10.3)
Chloride: 104 mmol/L (ref 101–111)
Creatinine, Ser: 0.81 mg/dL (ref 0.44–1.00)
GFR calc Af Amer: >60 mL/min (ref >60)
GFR calc non Af Amer: >60 mL/min (ref >60)
Glucose, Bld: 100 mg/dL — ABNORMAL HIGH (ref 65–99)
Potassium: 3.6 mmol/L (ref 3.5–5.1)
Sodium: 139 mmol/L (ref 135–145)
Total Bilirubin: 0.2 mg/dL — ABNORMAL LOW (ref 0.3–1.2)
Total Protein: 7.3 g/dL (ref 6.5–8.1)

## 2015-05-07 LAB — CBC
HCT: 39.6 % (ref 36.0–46.0)
Hemoglobin: 13.9 g/dL (ref 12.0–15.0)
MCH: 26.7 pg (ref 26.0–34.0)
MCHC: 35.1 g/dL (ref 30.0–36.0)
MCV: 76 fL — ABNORMAL LOW (ref 78.0–100.0)
Platelets: 271 10*3/uL (ref 150–400)
RBC: 5.21 MIL/uL — ABNORMAL HIGH (ref 3.87–5.11)
RDW: 13.6 % (ref 11.5–15.5)
WBC: 8.2 10*3/uL (ref 4.0–10.5)

## 2015-05-07 LAB — POCT PREGNANCY, URINE: Preg Test, Ur: NEGATIVE

## 2015-05-07 LAB — URINE MICROSCOPIC-ADD ON

## 2015-05-07 MED ORDER — TRAMADOL HCL 50 MG PO TABS: 50.0000 mg | ORAL_TABLET | Freq: Four times a day (QID) | ORAL | Status: DC | PRN

## 2015-05-07 MED ORDER — PROMETHAZINE HCL 25 MG/ML IJ SOLN: 12.5000 mg | Freq: Once | INTRAMUSCULAR | Status: DC

## 2015-05-07 MED ORDER — LACTATED RINGERS IV SOLN
INTRAVENOUS | Status: DC
Start: 2015-05-07 — End: 2015-05-08
  Administered 2015-05-07: 18:00:00 via INTRAVENOUS

## 2015-05-07 MED ORDER — KETOROLAC TROMETHAMINE 30 MG/ML IJ SOLN
30.0000 mg | Freq: Once | INTRAMUSCULAR | Status: AC
  Administered 2015-05-07: 30 mg via INTRAMUSCULAR
  Filled 2015-05-07: qty 1

## 2015-05-07 MED ORDER — PROMETHAZINE HCL 25 MG/ML IJ SOLN
12.5000 mg | Freq: Once | INTRAMUSCULAR | Status: AC
  Administered 2015-05-07: 12.5 mg via INTRAVENOUS

## 2015-05-07 MED ORDER — PROMETHAZINE HCL 12.5 MG PO TABS: 12.5000 mg | ORAL_TABLET | Freq: Four times a day (QID) | ORAL | Status: DC | PRN

## 2015-05-07 MED ORDER — HYDROMORPHONE HCL 1 MG/ML IJ SOLN
1.0000 mg | Freq: Once | INTRAMUSCULAR | Status: AC
  Administered 2015-05-07: 1 mg via INTRAVENOUS
  Filled 2015-05-07: qty 1

## 2015-05-07 MED ORDER — PROMETHAZINE HCL 25 MG/ML IJ SOLN
25.0000 mg | Freq: Once | INTRAMUSCULAR | Status: DC
  Filled 2015-05-07: qty 1

## 2015-05-07 NOTE — Discharge Instructions (Signed)
Endometriosis Endometriosis is a condition in which the tissue that lines the uterus (endometrium) grows outside of its normal location. The tissue may grow in many locations close to the uterus, but it commonly grows on the ovaries, fallopian tubes, vagina, or bowel. Because the uterus expels, or sheds, its lining every menstrual cycle, there is bleeding wherever the endometrial tissue is located. This can cause pain because blood is irritating to tissues not normally exposed to it.  CAUSES  The cause of endometriosis is not known.  SIGNS AND SYMPTOMS  Often, there are no symptoms. When symptoms are present, they can vary with the location of the displaced tissue. Various symptoms can occur at different times. Although symptoms occur mainly during a woman's menstrual period, they can also occur midcycle and usually stop with menopause. Some people may go months with no symptoms at all. Symptoms may include:   Back or abdominal pain.   Heavier bleeding during periods.   Pain during intercourse.   Painful bowel movements.   Infertility. DIAGNOSIS  Your health care provider will do a physical exam and ask about your symptoms. Various tests may be done, such as:   Blood tests and urine tests. These are done to help rule out other problems.   Ultrasound. This test is done to look for abnormal tissue.   An X-ray of the lower bowel (barium enema).  Laparoscopy. In this procedure, a thin, lighted tube with a tiny camera on the end (laparoscope) is inserted into your abdomen. This helps your health care provider look for abnormal tissue to confirm the diagnosis. The health care provider may also remove a small piece of tissue (biopsy) from any abnormal tissue found. This tissue sample can then be sent to a lab so it can be looked at under a microscope. TREATMENT  Treatment will vary and may include:   Medicines to relieve pain. Nonsteroidal anti-inflammatory drugs (NSAIDs) are a type of  pain medicine that can help to relieve the pain caused by endometriosis.  Hormonal therapy. When using hormonal therapy, periods are eliminated. This eliminates the monthly exposure to blood by the displaced endometrial tissue.   Surgery. Surgery may sometimes be done to remove the abnormal endometrial tissue. In severe cases, surgery may be done to remove the fallopian tubes, uterus, and ovaries (hysterectomy). HOME CARE INSTRUCTIONS   Take all medicines as directed by your health care provider. Do not take aspirin because it may increase bleeding when you are not on hormonal therapy.   Avoid activities that produce pain, including sexual activity. SEEK MEDICAL CARE IF:  You have pelvic pain before, after, or during your periods.  You have pelvic pain between periods that gets worse during your period.  You have pelvic pain during or after sex.  You have pelvic pain with bowel movements or urination, especially during your period.  You have problems getting pregnant.  You have a fever. SEEK IMMEDIATE MEDICAL CARE IF:   Your pain is severe and is not responding to pain medicine.   You have severe nausea and vomiting, or you cannot keep foods down.   You have pain that is limited to the right lower part of your abdomen.   You have swelling or increasing pain in your abdomen.   You see blood in your stool.  MAKE SURE YOU:   Understand these instructions.  Will watch your condition.  Will get help right away if you are not doing well or get worse.   This information   is not intended to replace advice given to you by your health care provider. Make sure you discuss any questions you have with your health care provider.   Document Released: 04/17/2000 Document Revised: 05/11/2014 Document Reviewed: 12/16/2012 Elsevier Interactive Patient Education 2016 Elsevier Inc.  

## 2015-05-07 NOTE — MAU Note (Signed)
Known hx of endometriosis.  Pain started yesterday.

## 2015-05-07 NOTE — MAU Provider Note (Signed)
History     CSN: [id number]  Arrival date and time: 05/07/15 1659   First Provider Initiated Contact with Patient 05/07/15 1727      Chief Complaint  Patient presents with  . Emesis  . Abdominal Pain   HPI  Joyce Franco 20 y.o. [id number] presents to the MAU with severe pelvic pain with nausea and vomiting that started this morning. She denies vaginal bleeding or discharge. She has a history of endometriosis  Past Medical History  Diagnosis Date  . Asthma   . Fractured pelvis (HCC)   . Medical history non-contributory   . Endometriosis, uterus     Past Surgical History  Procedure Laterality Date  . Foot surgery    . Laparoscopy N/A 11/08/2014    Procedure: LAPAROSCOPY DIAGNOSTIC with peritoneal  biopsy;  Surgeon: Geryl Rankins, MD;  Location: [facility];  Service: Gynecology;  Laterality: N/A;  . Chromopertubation  11/08/2014    Procedure: CHROMOPERTUBATION;  Surgeon: Geryl Rankins, MD;  Location: [facility];  Service: Gynecology;;  . Ablation on endometriosis  11/08/2014    Procedure: ABLATION ON ENDOMETRIOSIS;  Surgeon: Geryl Rankins, MD;  Location: [facility];  Service: Gynecology;;    No family history on file.  Social History  Substance Use Topics  . Smoking status: Never Smoker   . Smokeless tobacco: None  . Alcohol Use: No    Allergies:  Allergies  Allergen Reactions  . Ibuprofen Nausea And Vomiting    Prescriptions prior to admission  Medication Sig Dispense Refill Last Dose  . montelukast (SINGULAIR) 10 MG tablet Take 10 mg by mouth daily as needed (allergies).   Past Week at Unknown time  . Phenyleph-Doxylamine-DM-APAP (NYQUIL SEVERE COLD/FLU PO) Take 1 tablet by mouth at bedtime as needed (cold symptoms).   05/06/2015 at Unknown time  . tiZANidine (ZANAFLEX) 2 MG tablet Take 2 mg by mouth every 6 (six) hours as needed for muscle spasms.    Past Month at Unknown time  . albuterol (PROVENTIL HFA;VENTOLIN HFA) 108 (90 BASE) MCG/ACT inhaler Inhale 2 puffs into the lungs  every 6 (six) hours as needed. For shortness of breath   rescue  . fluticasone (FLOVENT HFA) 220 MCG/ACT inhaler Inhale 1 puff into the lungs 2 (two) times daily as needed (shortness of breath). Reported on 05/07/2015   Not Taking at Unknown time  . ketorolac (TORADOL) 10 MG tablet Take 1 tablet (10 mg total) by mouth every 6 (six) hours. Do not take with Ibuprofen (Patient not taking: Reported on 12/19/2014) 8 tablet 0 Not Taking  . medroxyPROGESTERone (DEPO-PROVERA) 150 MG/ML injection Inject 1 mL (150 mg total) into the muscle once. 1 mL 2 More than a month at Unknown time  . oxyCODONE-acetaminophen (PERCOCET/ROXICET) 5-325 MG per tablet Take 1-2 tablets by mouth every 4 (four) hours as needed for severe pain. (Patient not taking: Reported on 12/19/2014) 20 tablet 0 Not Taking at Unknown time  . promethazine (PHENERGAN) 25 MG suppository Place 1 suppository (25 mg total) rectally every 6 (six) hours as needed for nausea or vomiting. (Patient not taking: Reported on 12/19/2014) 12 each 0 Not Taking at Unknown time    Review of Systems  Constitutional: Negative for fever.  Gastrointestinal: Positive for nausea and vomiting.  Genitourinary:       Pelvic pain  All other systems reviewed and are negative.  Physical Exam   Blood pressure 121/91, pulse 83, temperature 98.2 F (36.8 C), temperature source Oral, resp. rate 20, last menstrual period 05/05/2015.  Physical Exam  Nursing note and vitals reviewed. Constitutional: She appears well-developed and well-nourished.  HENT:  Head: Normocephalic and atraumatic.  Neck: Normal range of motion.  Cardiovascular: Normal rate.   Respiratory: Effort normal. No respiratory distress.  GI: Soft.  Neurological: She is alert.  Skin: Skin is warm and dry.  Psychiatric: She has a normal mood and affect. Her behavior is normal. Judgment and thought content normal.   Results for orders placed or performed during the hospital encounter of 05/07/15 (from the  past 24 hour(s))  CBC     Status: Abnormal   Collection Time: 05/07/15  5:55 PM  Result Value Ref Range   WBC 8.2 4.0 - 10.5 K/uL   RBC 5.21 (H) 3.87 - 5.11 MIL/uL   Hemoglobin 13.9 12.0 - 15.0 g/dL   HCT 16.139.6 09.636.0 - 04.546.0 %   MCV 76.0 (L) 78.0 - 100.0 fL   MCH 26.7 26.0 - 34.0 pg   MCHC 35.1 30.0 - 36.0 g/dL   RDW 40.913.6 81.111.5 - 91.415.5 %   Platelets 271 150 - 400 K/uL  Comprehensive metabolic panel     Status: Abnormal   Collection Time: 05/07/15  5:55 PM  Result Value Ref Range   Sodium 139 135 - 145 mmol/L   Potassium 3.6 3.5 - 5.1 mmol/L   Chloride 104 101 - 111 mmol/L   CO2 24 22 - 32 mmol/L   Glucose, Bld 100 (H) 65 - 99 mg/dL   BUN 12 6 - 20 mg/dL   Creatinine, Ser 7.820.81 0.44 - 1.00 mg/dL   Calcium 9.3 8.9 - 95.610.3 mg/dL   Total Protein 7.3 6.5 - 8.1 g/dL   Albumin 4.4 3.5 - 5.0 g/dL   AST 22 15 - 41 U/L   ALT 13 (L) 14 - 54 U/L   Alkaline Phosphatase 53 38 - 126 U/L   Total Bilirubin 0.2 (L) 0.3 - 1.2 mg/dL   GFR calc non Af Amer >60 >60 mL/min   GFR calc Af Amer >60 >60 mL/min   Anion gap 11 5 - 15  Urinalysis, Routine w reflex microscopic (not at [facility])     Status: Abnormal   Collection Time: 05/07/15  7:55 PM  Result Value Ref Range   Color, Urine YELLOW YELLOW   APPearance CLEAR CLEAR   Specific Gravity, Urine 1.020 1.005 - 1.030   pH 8.0 5.0 - 8.0   Glucose, UA NEGATIVE NEGATIVE mg/dL   Hgb urine dipstick LARGE (A) NEGATIVE   Bilirubin Urine NEGATIVE NEGATIVE   Ketones, ur 40 (A) NEGATIVE mg/dL   Protein, ur NEGATIVE NEGATIVE mg/dL   Nitrite NEGATIVE NEGATIVE   Leukocytes, UA NEGATIVE NEGATIVE  Urine microscopic-add on     Status: Abnormal   Collection Time: 05/07/15  7:55 PM  Result Value Ref Range   Squamous Epithelial / LPF 0-5 (A) NONE SEEN   WBC, UA 0-5 0 - 5 WBC/hpf   RBC / HPF 6-30 0 - 5 RBC/hpf   Bacteria, UA RARE (A) NONE SEEN  Pregnancy, urine POC     Status: None   Collection Time: 05/07/15  8:26 PM  Result Value Ref Range   Preg Test, Ur  NEGATIVE NEGATIVE  [place] Transvaginal Non-ob  05/07/2015  CLINICAL DATA:  Pelvic pain for 2 days.  History of endometriosis. EXAM: TRANSABDOMINAL AND TRANSVAGINAL ULTRASOUND OF PELVIS TECHNIQUE: Both transabdominal and transvaginal ultrasound examinations of the pelvis were performed. Transabdominal technique was performed for global imaging of the pelvis including uterus,  ovaries, adnexal regions, and pelvic cul-de-sac. It was necessary to proceed with endovaginal exam following the transabdominal exam to visualize the endometrium, left and right ovary. COMPARISON:  Pelvic ultrasound 06/04/2014 FINDINGS: Uterus Measurements: 7.3 x 3.4 x 4.3 cm. No fibroids or other mass visualized. Endometrium Thickness: 4.8 mm, myometrial endometrial junction difficult to delineate. No focal abnormality visualized. Right ovary Measurements: 4.0 x 2.5 x 2.4 cm. Normal appearance/no adnexal mass. Blood flow is seen. Left ovary Measurements: 3.6 x 2.4 x 2.0 cm. Normal appearance/no adnexal mass. Blood flow seen. Other findings No abnormal free fluid. IMPRESSION: No acute abnormality. Endometrial borders difficult to delineate, but normal in thickness. Normal appearance of the ovaries. Electronically Signed   By: Rubye Oaks M.D.   On: 05/07/2015 19:24   US Pelvis Complete  05/07/2015  CLINICAL DATA:  Pelvic pain for 2 days.  History of endometriosis. EXAM: TRANSABDOMINAL AND TRANSVAGINAL ULTRASOUND OF PELVIS TECHNIQUE: Both transabdominal and transvaginal ultrasound examinations of the pelvis were performed. Transabdominal technique was performed for global imaging of the pelvis including uterus, ovaries, adnexal regions, and pelvic cul-de-sac. It was necessary to proceed with endovaginal exam following the transabdominal exam to visualize the endometrium, left and right ovary. COMPARISON:  Pelvic ultrasound 06/04/2014 FINDINGS: Uterus Measurements: 7.3 x 3.4 x 4.3 cm. No fibroids or other mass visualized. Endometrium Thickness:  4.8 mm, myometrial endometrial junction difficult to delineate. No focal abnormality visualized. Right ovary Measurements: 4.0 x 2.5 x 2.4 cm. Normal appearance/no adnexal mass. Blood flow is seen. Left ovary Measurements: 3.6 x 2.4 x 2.0 cm. Normal appearance/no adnexal mass. Blood flow seen. Other findings No abnormal free fluid. IMPRESSION: No acute abnormality. Endometrial borders difficult to delineate, but normal in thickness. Normal appearance of the ovaries. Electronically Signed   By: Rubye Oaks M.D.   On: 05/07/2015 19:24   MAU Course  Procedures  MDM Ultrasound; Pain med; antiemetic;   2000 - Care assumed from Illene Bolus, CNM Patient reports some improvement in pain and nausea, but still having pain Discussed results with Dr. Richardson Dopp. Advised 30 mg IM Toradol in MAU and discharge with Rx for Tramadol or other pain medication of choice Patient states that no PO medications help her pain.   Assessment and Plan  A: Endometriosis Pelvic pain   P: Discharge home Rx for Tramadol given to patient Warning signs for worsening condition discussed Patient advised to follow-up with Dr. Dion Body this week. Patient is to call the office for an appointment tomorrow.  Patient may return to MAU as needed or if her condition were to change or worsen   Marny Lowenstein, PA-C  05/07/2015, 9:31 PM

## 2015-05-08 LAB — GC/CHLAMYDIA PROBE AMP (~~LOC~~) NOT AT ARMC
Chlamydia: NEGATIVE
Neisseria Gonorrhea: NEGATIVE

## 2015-05-19 ENCOUNTER — Ambulatory Visit (INDEPENDENT_AMBULATORY_CARE_PROVIDER_SITE_OTHER): Payer: Federal, State, Local not specified - PPO | Admitting: Family Medicine

## 2015-05-19 VITALS — BP 128/80 | HR 74 | Temp 98.4°F | Resp 16 | Ht 65.0 in | Wt 150.2 lb

## 2015-05-19 DIAGNOSIS — K121 Other forms of stomatitis: Secondary | ICD-10-CM | POA: Diagnosis not present

## 2015-05-19 MED ORDER — CHLORHEXIDINE GLUCONATE 0.12 % MT SOLN: 15.0000 mL | Freq: Two times a day (BID) | OROMUCOSAL | Status: DC

## 2015-05-19 MED ORDER — TRIAMCINOLONE ACETONIDE 0.1 % EX CREA: 1.0000 "application " | TOPICAL_CREAM | Freq: Two times a day (BID) | CUTANEOUS | Status: DC

## 2015-05-19 MED ORDER — ACYCLOVIR 200 MG PO CAPS: 200.0000 mg | ORAL_CAPSULE | Freq: Every day | ORAL | Status: DC

## 2015-05-19 NOTE — Patient Instructions (Signed)
Stomatitis  Stomatitis is a condition that causes inflammation in your mouth. It can affect a part of your mouth or your whole mouth. The condition often affects your cheek, teeth, gums, lips, and tongue. Stomatitis can also affect the mucous membranes that surround your mouth (mucosa).  Pain from stomatitis can make it hard for you to eat or drink. Severe cases of this condition can lead to dehydration or poor nutrition.  CAUSES  Common causes of this condition include:  · Viruses, such as cold sores or oral herpes and shingles.  · Canker sores.  · Bacterial infections.  · Fungus or yeast infections, such as oral thrush.  · Not getting adequate nutrition.  · Injury to your mouth. This can be from:    Dentures or braces that do not fit well.    Biting your tongue or cheek.    Burning your mouth.    Having sharp or broken teeth.  · Gum disease.  · Using tobacco, especially chewing tobacco.  · Allergies to foods, medicines, or substances that are used in your mouth.  · Medicines, including cancer medicines (chemotherapy), antihistamines, and seizure medicines.  In some cases, the cause may not be known.  RISK FACTORS  This condition is more likely to develop in people who:  · Have poor oral hygiene or poor nutrition.  · Have any condition that causes a dry mouth.  · Are under a lot of physical or emotional stress.  · Have any condition that weakens the body's defense system (immune system).  · Are being treated for cancer.  · Smoke.  SYMPTOMS  The most common symptoms of this condition are pain, swelling, and redness inside your mouth. The pain may feel like burning or stinging. It may get worse from eating or drinking. Other symptoms include:  · Painful, shallow sores (ulcers) in the mouth.  · Blisters in the mouth.  · Bleeding gums.  · Swollen gums.  · Irritability and fatigue.  · Bad breath.  · Bad taste in the mouth.  · Fever.  DIAGNOSIS  This condition is diagnosed with a physical exam to check for bleeding gums  and mouth ulcers. You may also have other tests, including:  · Blood tests to look for infection or vitamin deficiencies.  · Mouth swab to get a fluid sample to test for bacteria (culture).  · Tissue sample from an ulcer to examine under a microscope (biopsy).  TREATMENT  Treatment for stomatitis depends on the cause. Treatment may include medicines, such as:  · Over-the counter (OTC) pain medicines.  · Topical anesthetic to numb the area if you have severe pain.  · Antibiotics to treat a bacterial infection.  · Antifungals to treat a fungal infection.  · Antivirals to treat a viral infection.  · Mouth rinses that contain steroids to reduce the swelling in your mouth.  · Other medicines to coat or numb your mouth.  HOME CARE INSTRUCTIONS  Medicines  · Take medicines only as directed by your health care provider.  · If you were prescribed an antibiotic, finish all of it even if you start to feel better.  Lifestyle  · Practice good oral hygiene:    Gently brush your teeth with a soft, nylon-bristled toothbrush two times each day.    Floss your teeth every day.    Have your teeth cleaned regularly, as recommended by your dentist.  · Eat a balanced diet. Do not eat:    Spicy foods.      Citrus, such as oranges.    Foods that have sharp edges, such as chips.  · Avoid any foods or other allergens that you think may be causing your stomatitis.  · If you have dentures, make sure that they are properly fitted.  · Do not use any tobacco products, including cigarettes, chewing tobacco, or electronic cigarettes. If you need help quitting, ask your health care provider.  · Find ways to reduce stress. Try yoga or meditation. Ask your health care provider for other ideas.  General Instructions  · Use a salt-water rinse for pain as directed by your health care provider. Mix 1 tsp of salt in 2 cups of water.  · Drink enough fluid to keep your urine clear or pale yellow. This will keep you hydrated.  SEEK MEDICAL CARE IF:  · Your  symptoms get worse.  · You develop new symptoms, especially:    A rash.    New symptoms that do not involve your mouth area.  · Your symptoms last longer than three weeks.  · Your stomatitis goes away and then returns.  · You have a harder time eating and drinking normally.  · You have increasing fatigue or weakness.  · You lose your appetite or you feel nauseous.  · You have a fever.     This information is not intended to replace advice given to you by your health care provider. Make sure you discuss any questions you have with your health care provider.     Document Released: 02/15/2007 Document Revised: 09/04/2014 Document Reviewed: 04/16/2014  Elsevier Interactive Patient Education ©2016 Elsevier Inc.

## 2015-05-19 NOTE — Progress Notes (Signed)
This chart was scribed for Elvina SidleKurt Lauenstein, MD by [facility] Center[facility]adim Abu Hashem, medical scribe at [facility].The patient was seen in exam room 10 and the patient's care was started at 12:39 PM.  Patient ID: Joyce ColeBrook K Franco MRN: [id number], DOB: 02/09/1996, 20 y.o. Date of Encounter: 05/19/2015  Primary Physician: Jefferey PicaUBIN,DAVID M, MD  Chief Complaint:  Chief Complaint  Patient presents with   Swollen lips    Swelling started Thursday. Pt states she had tongue pierced January 6. tongue not swollen   HPI:  Joyce ColeBrook K Franco is a 20 y.o. female who presents to [facility] complaining of swollen lips with associated mouth sores for the past three days. No new lipsticks, no new medication. She did get her tongue pierced one week ago, but the tongue is not swollen. 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   Past Medical History  Diagnosis Date   Asthma    Fractured pelvis (HCC)    Medical history non-contributory    Endometriosis, uterus     Home Meds: Prior to Admission medications   Medication Sig Start Date End Date Taking? Authorizing Provider  medroxyPROGESTERone (DEPO-PROVERA) 150 MG/ML injection Inject 1 mL (150 mg total) into the muscle once. 06/26/14  Yes Geryl RankinsEvelyn Varnado, MD  albuterol (PROVENTIL HFA;VENTOLIN HFA) 108 (90 BASE) MCG/ACT inhaler Inhale 2 puffs into the lungs every 6 (six) hours as needed. Reported on 05/19/2015    Historical Provider, MD  fluticasone (FLOVENT HFA) 220 MCG/ACT inhaler Inhale 1 puff into the lungs 2 (two) times daily as needed (shortness of breath). Reported on 05/19/2015    Historical Provider, MD  montelukast (SINGULAIR) 10 MG tablet Take 10 mg by mouth daily as needed (allergies). Reported on 05/19/2015    Historical Provider, MD  oxyCODONE-acetaminophen (PERCOCET/ROXICET) 5-325 MG per tablet Take 1-2 tablets by mouth every 4 (four) hours as needed for severe pain. Patient not taking: Reported on 12/19/2014 11/08/14   Geryl RankinsEvelyn Varnado, MD    Phenyleph-Doxylamine-DM-APAP [facility](NYQUIL SEVERE COLD/FLU PO) Take 1 tablet by mouth at bedtime as needed (cold symptoms). Reported on 05/19/2015    Historical Provider, MD  promethazine (PHENERGAN) 12.5 MG tablet Take 1 tablet (12.5 mg total) by mouth every 6 (six) hours as needed for nausea or vomiting. Patient not taking: Reported on 05/19/2015 05/07/15   Marny LowensteinJulie N Wenzel, PA-C  promethazine (PHENERGAN) 25 MG suppository Place 1 suppository (25 mg total) rectally every 6 (six) hours as needed for nausea or vomiting. Patient not taking: Reported on 12/19/2014 06/26/14   Geryl RankinsEvelyn Varnado, MD  tiZANidine (ZANAFLEX) 2 MG tablet Take 2 mg by mouth every 6 (six) hours as needed for muscle spasms. Reported on 05/19/2015    Historical Provider, MD  traMADol (ULTRAM) 50 MG tablet Take 1 tablet (50 mg total) by mouth every 6 (six) hours as needed. Patient not taking: Reported on 05/19/2015 05/07/15   Marny LowensteinJulie N Wenzel, PA-C   Allergies:  Allergies  Allergen Reactions   Ibuprofen Nausea And Vomiting   Social History   Social History   Marital Status: Single    Spouse Name: N/A   Number of Children: N/A   Years of Education: N/A   Occupational History   Not on file.   Social History Main Topics   Smoking status: Never Smoker    Smokeless tobacco: Not on file   Alcohol Use: No   Drug Use: No   Sexual Activity: Yes    Birth Control/ Protection: IUD   Other  Topics Concern   Not on file   Social History Narrative    Review of Systems: Constitutional: negative for chills, fever, night sweats, weight changes, or fatigue  HEENT: negative for vision changes, hearing loss, congestion, rhinorrhea, ST, epistaxis, or sinus pressure. Positive for lip swelling. Cardiovascular: negative for chest pain or palpitations Respiratory: negative for hemoptysis, wheezing, shortness of breath, or cough Abdominal: negative for abdominal pain, nausea, vomiting, diarrhea, or constipation Dermatological: Positive for  rash. Neurologic: negative for headache, dizziness, or syncope All other systems reviewed and are otherwise negative with the exception to those above and in the HPI.  Physical Exam: Blood pressure 128/80, pulse 74, temperature 98.4 F (36.9 C), temperature source Oral, resp. rate 16, height 5\' 5"  (1.651 m), weight 150 lb 3.2 oz (68.13 kg), last menstrual period 05/05/2015., Body mass index is 24.99 kg/(m^2). General: Well developed, well nourished, in no acute distress. Head: Normocephalic, atraumatic, eyes without discharge, sclera non-icteric, nares are without discharge. Bilateral auditory canals clear, TM's are without perforation, pearly grey and translucent with reflective cone of light bilaterally. Oral cavity moist, posterior pharynx without exudate, erythema, peritonsillar abscess, or post nasal drip.  Neck: Supple. No thyromegaly. Full ROM. No lymphadenopathy. Lungs: Clear bilaterally to auscultation without wheezes, rales, or rhonchi. Breathing is unlabored. Heart: RRR with S1 S2. No murmurs, rubs, or gallops appreciated. Abdomen: Soft, non-tender, non-distended with normoactive bowel sounds. No hepatomegaly. No rebound/guarding. No obvious abdominal masses. Msk:  Strength and tone normal for age. Extremities/Skin: Warm and dry. No clubbing or cyanosis. No edema. No rashes or suspicious lesions. Lips are swollen and cracked, aphthous ulcers in her mouth Neuro: Alert and oriented X 3. Moves all extremities spontaneously. Gait is normal. CNII-XII grossly in tact. Psych:  Responds to questions appropriately with a normal affect.    ASSESSMENT AND PLAN:  20 y.o. year old female with stomatitis  By signing my name below, I, Nadim Abuhashem, attest that this documentation has been prepared under the direction and in the presence of Elvina Sidle, MD.  Electronically Signed: Conchita Paris, medical scribe. 05/19/2015 12:45 PM.  This chart was scribed in my presence and reviewed by me  personally.    ICD-9-CM ICD-10-CM   1. Stomatitis 528.00 K12.1 triamcinolone cream (KENALOG) 0.1 %     acyclovir (ZOVIRAX) 200 MG capsule     chlorhexidine (PERIDEX) 0.12 % solution     Signed, Elvina Sidle, MD

## 2015-08-20 DIAGNOSIS — Z309 Encounter for contraceptive management, unspecified: Secondary | ICD-10-CM | POA: Diagnosis not present

## 2015-09-24 DIAGNOSIS — F329 Major depressive disorder, single episode, unspecified: Secondary | ICD-10-CM | POA: Diagnosis not present

## 2015-09-24 DIAGNOSIS — G8929 Other chronic pain: Secondary | ICD-10-CM | POA: Diagnosis not present

## 2015-11-07 DIAGNOSIS — Z309 Encounter for contraceptive management, unspecified: Secondary | ICD-10-CM | POA: Diagnosis not present

## 2015-12-23 DIAGNOSIS — K080 Exfoliation of teeth due to systemic causes: Secondary | ICD-10-CM | POA: Diagnosis not present

## 2016-02-07 DIAGNOSIS — R102 Pelvic and perineal pain: Secondary | ICD-10-CM | POA: Diagnosis not present

## 2016-02-07 DIAGNOSIS — Z309 Encounter for contraceptive management, unspecified: Secondary | ICD-10-CM | POA: Diagnosis not present

## 2016-02-07 DIAGNOSIS — F3289 Other specified depressive episodes: Secondary | ICD-10-CM | POA: Diagnosis not present

## 2016-05-06 ENCOUNTER — Other Ambulatory Visit (HOSPITAL_COMMUNITY)
Admission: RE | Admit: 2016-05-06 | Discharge: 2016-05-06 | Disposition: A | Discharge status: Home / Self Care | Payer: Federal, State, Local not specified - PPO | Source: Ambulatory Visit | Attending: Obstetrics and Gynecology | Admitting: Obstetrics and Gynecology

## 2016-05-06 ENCOUNTER — Other Ambulatory Visit: Payer: Self-pay | Admitting: Obstetrics and Gynecology

## 2016-05-06 DIAGNOSIS — N898 Other specified noninflammatory disorders of vagina: Secondary | ICD-10-CM | POA: Diagnosis not present

## 2016-05-06 DIAGNOSIS — Z113 Encounter for screening for infections with a predominantly sexual mode of transmission: Secondary | ICD-10-CM | POA: Diagnosis not present

## 2016-05-06 DIAGNOSIS — Z01411 Encounter for gynecological examination (general) (routine) with abnormal findings: Secondary | ICD-10-CM | POA: Diagnosis not present

## 2016-05-06 DIAGNOSIS — Z01419 Encounter for gynecological examination (general) (routine) without abnormal findings: Secondary | ICD-10-CM | POA: Diagnosis not present

## 2016-05-08 DIAGNOSIS — Z131 Encounter for screening for diabetes mellitus: Secondary | ICD-10-CM | POA: Diagnosis not present

## 2016-05-08 DIAGNOSIS — N898 Other specified noninflammatory disorders of vagina: Secondary | ICD-10-CM | POA: Diagnosis not present

## 2016-05-08 DIAGNOSIS — Z13220 Encounter for screening for lipoid disorders: Secondary | ICD-10-CM | POA: Diagnosis not present

## 2016-05-08 DIAGNOSIS — Z113 Encounter for screening for infections with a predominantly sexual mode of transmission: Secondary | ICD-10-CM | POA: Diagnosis not present

## 2016-05-11 LAB — CYTOLOGY - PAP
Adequacy: ABSENT
Diagnosis: NEGATIVE

## 2016-07-03 DIAGNOSIS — R945 Abnormal results of liver function studies: Secondary | ICD-10-CM | POA: Diagnosis not present

## 2016-07-03 DIAGNOSIS — N764 Abscess of vulva: Secondary | ICD-10-CM | POA: Diagnosis not present

## 2016-09-24 DIAGNOSIS — Z3049 Encounter for surveillance of other contraceptives: Secondary | ICD-10-CM | POA: Diagnosis not present

## 2016-09-24 DIAGNOSIS — Z3202 Encounter for pregnancy test, result negative: Secondary | ICD-10-CM | POA: Diagnosis not present

## 2017-01-27 DIAGNOSIS — Z309 Encounter for contraceptive management, unspecified: Secondary | ICD-10-CM | POA: Diagnosis not present

## 2017-06-22 ENCOUNTER — Other Ambulatory Visit: Payer: Self-pay | Admitting: Obstetrics and Gynecology

## 2017-06-22 ENCOUNTER — Other Ambulatory Visit (HOSPITAL_COMMUNITY)
Admission: RE | Admit: 2017-06-22 | Discharge: 2017-06-22 | Disposition: A | Discharge status: Home / Self Care | Payer: Federal, State, Local not specified - PPO | Source: Ambulatory Visit | Attending: Obstetrics and Gynecology | Admitting: Obstetrics and Gynecology

## 2017-06-22 DIAGNOSIS — Z01419 Encounter for gynecological examination (general) (routine) without abnormal findings: Secondary | ICD-10-CM | POA: Insufficient documentation

## 2017-06-22 DIAGNOSIS — Z113 Encounter for screening for infections with a predominantly sexual mode of transmission: Secondary | ICD-10-CM | POA: Diagnosis not present

## 2017-06-22 DIAGNOSIS — Z01411 Encounter for gynecological examination (general) (routine) with abnormal findings: Secondary | ICD-10-CM | POA: Diagnosis not present

## 2017-06-22 DIAGNOSIS — R635 Abnormal weight gain: Secondary | ICD-10-CM | POA: Diagnosis not present

## 2017-06-23 LAB — CYTOLOGY - PAP: DIAGNOSIS: NEGATIVE

## 2017-06-25 DIAGNOSIS — Z3042 Encounter for surveillance of injectable contraceptive: Secondary | ICD-10-CM | POA: Diagnosis not present

## 2017-07-26 DIAGNOSIS — Z202 Contact with and (suspected) exposure to infections with a predominantly sexual mode of transmission: Secondary | ICD-10-CM | POA: Diagnosis not present

## 2017-07-26 DIAGNOSIS — K121 Other forms of stomatitis: Secondary | ICD-10-CM | POA: Diagnosis not present

## 2017-07-26 DIAGNOSIS — N898 Other specified noninflammatory disorders of vagina: Secondary | ICD-10-CM | POA: Diagnosis not present

## 2017-09-03 DIAGNOSIS — Z3042 Encounter for surveillance of injectable contraceptive: Secondary | ICD-10-CM | POA: Diagnosis not present

## 2018-04-10 ENCOUNTER — Inpatient Hospital Stay (HOSPITAL_COMMUNITY)
Admission: AD | Admit: 2018-04-10 | Discharge: 2018-04-10 | Disposition: A | Discharge status: Home / Self Care | Payer: Federal, State, Local not specified - PPO | Source: Ambulatory Visit | Attending: Obstetrics and Gynecology | Admitting: Obstetrics and Gynecology

## 2018-04-10 ENCOUNTER — Encounter (HOSPITAL_COMMUNITY): Payer: Self-pay | Admitting: *Deleted

## 2018-04-10 DIAGNOSIS — R1030 Lower abdominal pain, unspecified: Secondary | ICD-10-CM | POA: Diagnosis not present

## 2018-04-10 DIAGNOSIS — N946 Dysmenorrhea, unspecified: Secondary | ICD-10-CM | POA: Insufficient documentation

## 2018-04-10 LAB — CBC WITH DIFFERENTIAL/PLATELET
Basophils Absolute: 0 10*3/uL (ref 0.0–0.1)
Basophils Relative: 0 %
Eosinophils Absolute: 0 10*3/uL (ref 0.0–0.5)
Eosinophils Relative: 0 %
HCT: 39 % (ref 36.0–46.0)
HEMOGLOBIN: 13.5 g/dL (ref 12.0–15.0)
Lymphocytes Relative: 26 %
Lymphs Abs: 3 10*3/uL (ref 0.7–4.0)
MCH: 27.6 pg (ref 26.0–34.0)
MCHC: 34.6 g/dL (ref 30.0–36.0)
MCV: 79.8 fL — ABNORMAL LOW (ref 80.0–100.0)
MONOS PCT: 4 %
Monocytes Absolute: 0.5 10*3/uL (ref 0.1–1.0)
NEUTROS PCT: 70 %
Neutro Abs: 7.8 10*3/uL — ABNORMAL HIGH (ref 1.7–7.7)
Platelets: 302 10*3/uL (ref 150–400)
RBC: 4.89 MIL/uL (ref 3.87–5.11)
RDW: 14.2 % (ref 11.5–15.5)
WBC: 11.3 10*3/uL — ABNORMAL HIGH (ref 4.0–10.5)
nRBC: 0 % (ref 0.0–0.2)

## 2018-04-10 LAB — URINALYSIS, ROUTINE W REFLEX MICROSCOPIC
BACTERIA UA: NONE SEEN
Bilirubin Urine: NEGATIVE
Glucose, UA: NEGATIVE mg/dL
Ketones, ur: NEGATIVE mg/dL
Leukocytes, UA: NEGATIVE
Nitrite: NEGATIVE
Protein, ur: 30 mg/dL — AB
Specific Gravity, Urine: 1.028 (ref 1.005–1.030)
pH: 5 (ref 5.0–8.0)

## 2018-04-10 LAB — POCT PREGNANCY, URINE: PREG TEST UR: NEGATIVE

## 2018-04-10 LAB — WET PREP, GENITAL
Sperm: NONE SEEN
Trich, Wet Prep: NONE SEEN
Yeast Wet Prep HPF POC: NONE SEEN

## 2018-04-10 MED ORDER — MEDROXYPROGESTERONE ACETATE 150 MG/ML IM SUSP
150.0000 mg | Freq: Once | INTRAMUSCULAR | Status: AC
  Administered 2018-04-10: 150 mg via INTRAMUSCULAR
  Filled 2018-04-10: qty 1

## 2018-04-10 MED ORDER — KETOROLAC TROMETHAMINE 30 MG/ML IJ SOLN
15.0000 mg | Freq: Once | INTRAMUSCULAR | Status: AC
  Administered 2018-04-10: 15 mg via INTRAVENOUS
  Filled 2018-04-10: qty 1

## 2018-04-10 MED ORDER — NAPROXEN 375 MG PO TABS: 375.0000 mg | ORAL_TABLET | Freq: Two times a day (BID) | ORAL | 0 refills | Status: DC

## 2018-04-10 MED ORDER — ONDANSETRON HCL 4 MG PO TABS: 4.0000 mg | ORAL_TABLET | Freq: Four times a day (QID) | ORAL | 0 refills | Status: DC | PRN

## 2018-04-10 MED ORDER — ONDANSETRON HCL 4 MG/2ML IJ SOLN
4.0000 mg | Freq: Once | INTRAMUSCULAR | Status: AC
  Administered 2018-04-10: 4 mg via INTRAVENOUS
  Filled 2018-04-10: qty 2

## 2018-04-10 MED ORDER — SODIUM CHLORIDE 0.9 % IV SOLN
INTRAVENOUS | Status: AC
  Administered 2018-04-10: 14:00:00 via INTRAVENOUS

## 2018-04-10 NOTE — Discharge Instructions (Signed)
Dysmenorrhea °Menstrual cramps (dysmenorrhea) are caused by the muscles of the uterus tightening (contracting) during a menstrual period. For some women, this discomfort is merely bothersome. For others, dysmenorrhea can be severe enough to interfere with everyday activities for a few days each month. °Primary dysmenorrhea is menstrual cramps that last a couple of days when you start having menstrual periods or soon after. This often begins after a teenager starts having her period. As a woman gets older or has a baby, the cramps will usually lessen or disappear. Secondary dysmenorrhea begins later in life, lasts longer, and the pain may be stronger than primary dysmenorrhea. The pain may start before the period and last a few days after the period. °What are the causes? °Dysmenorrhea is usually caused by an underlying problem, such as: °· The tissue lining the uterus grows outside of the uterus in other areas of the body (endometriosis). °· The endometrial tissue, which normally lines the uterus, is found in or grows into the muscular walls of the uterus (adenomyosis). °· The pelvic blood vessels are engorged with blood just before the menstrual period (pelvic congestive syndrome). °· Overgrowth of cells (polyps) in the lining of the uterus or cervix. °· Falling down of the uterus (prolapse) because of loose or stretched ligaments. °· Depression. °· Bladder problems, infection, or inflammation. °· Problems with the intestine, a tumor, or irritable bowel syndrome. °· Cancer of the female organs or bladder. °· A severely tipped uterus. °· A very tight opening or closed cervix. °· Noncancerous tumors of the uterus (fibroids). °· Pelvic inflammatory disease (PID). °· Pelvic scarring (adhesions) from a previous surgery. °· Ovarian cyst. °· An intrauterine device (IUD) used for birth control. °What increases the risk? °You may be at greater risk of dysmenorrhea if: °· You are younger than age 30. °· You started puberty  early. °· You have irregular or heavy bleeding. °· You have never given birth. °· You have a family history of this problem. °· You are a smoker. °What are the signs or symptoms? °· Cramping or throbbing pain in your lower abdomen. °· Headaches. °· Lower back pain. °· Nausea or vomiting. °· Diarrhea. °· Sweating or dizziness. °· Loose stools. °How is this diagnosed? °A diagnosis is based on your history, symptoms, physical exam, diagnostic tests, or procedures. Diagnostic tests or procedures may include: °· Blood tests. °· Ultrasonography. °· An examination of the lining of the uterus (dilation and curettage, D&C). °· An examination inside your abdomen or pelvis with a scope (laparoscopy). °· X-rays. °· CT scan. °· MRI. °· An examination inside the bladder with a scope (cystoscopy). °· An examination inside the intestine or stomach with a scope (colonoscopy, gastroscopy). °How is this treated? °Treatment depends on the cause of the dysmenorrhea. Treatment may include: °· Pain medicine prescribed by your health care provider. °· Birth control pills or an IUD with progesterone hormone in it. °· Hormone replacement therapy. °· Nonsteroidal anti-inflammatory drugs (NSAIDs). These may help stop the production of prostaglandins. °· Surgery to remove adhesions, endometriosis, ovarian cyst, or fibroids. °· Removal of the uterus (hysterectomy). °· Progesterone shots to stop the menstrual period. °· Cutting the nerves on the sacrum that go to the female organs (presacral neurectomy). °· Electric current to the sacral nerves (sacral nerve stimulation). °· Antidepressant medicine. °· Psychiatric therapy, counseling, or group therapy. °· Exercise and physical therapy. °· Meditation and yoga therapy. °· Acupuncture. °Follow these instructions at home: °· Only take over-the-counter or prescription medicines as directed   by your health care provider. °· Place a heating pad or hot water bottle on your lower back or abdomen. Do not  sleep with the heating pad. °· Use aerobic exercises, walking, swimming, biking, and other exercises to help lessen the cramping. °· Massage to the lower back or abdomen may help. °· Stop smoking. °· Avoid alcohol and caffeine. °Contact a health care provider if: °· Your pain does not get better with medicine. °· You have pain with sexual intercourse. °· Your pain increases and is not controlled with medicines. °· You have abnormal vaginal bleeding with your period. °· You develop nausea or vomiting with your period that is not controlled with medicine. °Get help right away if: °You pass out. °This information is not intended to replace advice given to you by your health care provider. Make sure you discuss any questions you have with your health care provider. °Document Released: 04/20/2005 Document Revised: 09/26/2015 Document Reviewed: 10/06/2012 °Elsevier Interactive Patient Education © 2017 [employer] ° °

## 2018-04-10 NOTE — MAU Provider Note (Signed)
[facility] Provider Note   CSN: [id number] Arrival date & time: 04/10/18  1203     History   Chief Complaint Chief Complaint  Patient presents with  . Abdominal Pain  . Emesis  . Vaginal Bleeding    HPI Joyce Franco is a 22 y.o. G0P0000 who presents to MAU with c/o lower abdominal pain after menses started 2 days ago. Patient reports n/v and stabbing lower abdominal pain. Patient reports the pain is off and on but hurts bad when the cramp comes. She has been getting Depo Provera in the past to prevent the pain but hasn't taken it in a while. Patient reports she always has this pain when she has a period.  The history is provided by the patient. No language interpreter was used.  Abdominal Pain  The primary symptoms of the illness include abdominal pain, nausea, vomiting, diarrhea and vaginal bleeding. The primary symptoms of the illness do not include fever, shortness of breath or dysuria. The current episode started 2 days ago. The onset of the illness was gradual.  The patient states that she believes she is currently not pregnant. Additional symptoms associated with the illness include chills. Symptoms associated with the illness do not include frequency or back pain. Associated symptoms comments: diarrhea.  Emesis   Associated symptoms include abdominal pain, chills and diarrhea. Pertinent negatives include no cough, no fever and no headaches.  Vaginal Bleeding  Associated symptoms include abdominal pain, chills, nausea and vomiting. Pertinent negatives include no chest pain, coughing, fever, headaches or rash.    Past Medical History:  Diagnosis Date  . Asthma   . Endometriosis, uterus   . Fractured pelvis (HCC)   . Medical history non-contributory     There are no active problems to display for this patient.   Past Surgical History:  Procedure Laterality Date  . ABLATION ON ENDOMETRIOSIS  11/08/2014   Procedure: ABLATION ON ENDOMETRIOSIS;   Surgeon: Geryl Rankins, MD;  Location: [facility];  Service: Gynecology;;  . [name]  11/08/2014   Procedure: CHROMOPERTUBATION;  Surgeon: Geryl Rankins, MD;  Location: [facility];  Service: Gynecology;;  . FOOT SURGERY    . LAPAROSCOPY N/A 11/08/2014   Procedure: LAPAROSCOPY DIAGNOSTIC with peritoneal  biopsy;  Surgeon: Geryl Rankins, MD;  Location: [facility];  Service: Gynecology;  Laterality: N/A;     OB History    Gravida  0   Para  0   Term  0   Preterm  0   AB  0   Living  0     SAB  0   TAB  0   Ectopic  0   Multiple  0   Live Births               Home Medications    Prior to Admission medications   Medication Sig Start Date End Date Taking? Authorizing Provider  acyclovir (ZOVIRAX) 200 MG capsule Take 1 capsule (200 mg total) by mouth 5 (five) times daily. 05/19/15   Elvina Sidle, MD  albuterol (PROVENTIL HFA;VENTOLIN HFA) 108 (90 BASE) MCG/ACT inhaler Inhale 2 puffs into the lungs every 6 (six) hours as needed. Reported on 05/19/2015    [provider]  chlorhexidine (PERIDEX) 0.12 % solution Use as directed 15 mLs in the mouth or throat 2 (two) times daily. 05/19/15   Elvina Sidle, MD  fluticasone (FLOVENT HFA) 220 MCG/ACT inhaler Inhale 1 puff into the lungs 2 (two) times daily as needed (shortness  of breath). Reported on 05/19/2015    [provider]  medroxyPROGESTERone (DEPO-PROVERA) 150 MG/ML injection Inject 1 mL (150 mg total) into the muscle once. 06/26/14   Geryl Rankins, MD  montelukast (SINGULAIR) 10 MG tablet Take 10 mg by mouth daily as needed (allergies). Reported on 05/19/2015    [provider]  naproxen (NAPROSYN) 375 MG tablet Take 1 tablet (375 mg total) by mouth 2 (two) times daily. 04/10/18   [name], NP  ondansetron (ZOFRAN) 4 MG tablet Take 1 tablet (4 mg total) by mouth every 6 (six) hours as needed for nausea or vomiting. 04/10/18   [name], NP  oxyCODONE-acetaminophen (PERCOCET/ROXICET) 5-325  MG per tablet Take 1-2 tablets by mouth every 4 (four) hours as needed for severe pain. Patient not taking: Reported on 12/19/2014 11/08/14   Geryl Rankins, MD  Phenyleph-Doxylamine-DM-APAP [facility] SEVERE COLD/FLU PO) Take 1 tablet by mouth at bedtime as needed (cold symptoms). Reported on 05/19/2015    [provider]  promethazine (PHENERGAN) 12.5 MG tablet Take 1 tablet (12.5 mg total) by mouth every 6 (six) hours as needed for nausea or vomiting. Patient not taking: Reported on 05/19/2015 05/07/15   Marny Lowenstein, PA-C  promethazine (PHENERGAN) 25 MG suppository Place 1 suppository (25 mg total) rectally every 6 (six) hours as needed for nausea or vomiting. Patient not taking: Reported on 12/19/2014 06/26/14   Geryl Rankins, MD  tiZANidine (ZANAFLEX) 2 MG tablet Take 2 mg by mouth every 6 (six) hours as needed for muscle spasms. Reported on 05/19/2015    [provider]  traMADol (ULTRAM) 50 MG tablet Take 1 tablet (50 mg total) by mouth every 6 (six) hours as needed. Patient not taking: Reported on 05/19/2015 05/07/15   Marny Lowenstein, PA-C  triamcinolone cream (KENALOG) 0.1 % Apply 1 application topically 2 (two) times daily. 05/19/15   Elvina Sidle, MD    Family History History reviewed. No pertinent family history.  Social History Social History   Tobacco Use  . Smoking status: Never Smoker  . Smokeless tobacco: Never Used  Substance Use Topics  . Alcohol use: No  . Drug use: No     Allergies   Ibuprofen   Review of Systems Review of Systems  Constitutional: Positive for chills. Negative for fever.  HENT: Negative.   Eyes: Negative for pain, discharge and visual disturbance.  Respiratory: Negative for cough and shortness of breath.   Cardiovascular: Negative for chest pain.  Gastrointestinal: Positive for abdominal pain, diarrhea, nausea and vomiting.  Genitourinary: Positive for vaginal bleeding. Negative for dysuria and frequency.  Musculoskeletal:  Negative for back pain and neck stiffness.  Skin: Negative for rash.  Neurological: Negative for syncope and headaches.  Psychiatric/Behavioral: Negative for confusion.     Physical Exam Updated Vital Signs BP (!) 131/91 (BP Location: Right Arm)   Pulse 93   Temp 98.5 F (36.9 C) (Oral)   Resp 18   Wt 74.2 kg   LMP 04/08/2018   BMI 27.21 kg/m   Physical Exam  Constitutional: She is oriented to person, place, and time. She appears well-developed and well-nourished. No distress.  HENT:  Head: Normocephalic and atraumatic.  Eyes: EOM are normal.  Neck: Neck supple.  Cardiovascular: Normal rate.  Pulmonary/Chest: Effort normal.  Abdominal: Soft. There is tenderness.  Genitourinary:  Genitourinary Comments: External genitalia without lesions, small blood vaginal vault. No CMT, no adnexal tenderness, uterus without palpable enlargement.   Musculoskeletal: Normal range of motion.  Neurological: She is alert and oriented to person, place, and time. No cranial nerve deficit.  Skin: Skin is warm and dry.  Psychiatric: She has a normal mood and affect.  Nursing note and vitals reviewed.    ED Treatments / Results  Labs (all labs ordered are listed, but only abnormal results are displayed) Labs Reviewed  WET PREP, GENITAL - Abnormal; Notable for the following components:      Result Value   Clue Cells Wet Prep HPF POC PRESENT (*)    WBC, Wet Prep HPF POC FEW (*)    All other components within normal limits  URINALYSIS, ROUTINE W REFLEX MICROSCOPIC - Abnormal; Notable for the following components:   APPearance HAZY (*)    Hgb urine dipstick LARGE (*)    Protein, ur 30 (*)    RBC / HPF >50 (*)    All other components within normal limits  CBC WITH DIFFERENTIAL/PLATELET - Abnormal; Notable for the following components:   WBC 11.3 (*)    MCV 79.8 (*)    Neutro Abs 7.8 (*)    All other components within normal limits  POCT PREGNANCY, URINE  GC/CHLAMYDIA PROBE AMP ([facility] NOT AT [facility]   Radiology No results found.  Procedures Procedures (including critical care time)  Medications Ordered in ED Medications  0.9 %  sodium chloride infusion ( Intravenous New Bag/Given 04/10/18 1420)  medroxyPROGESTERone (DEPO-PROVERA) injection 150 mg (has no administration in time range)  ondansetron (ZOFRAN) injection 4 mg (4 mg Intravenous Given 04/10/18 1421)  ketorolac (TORADOL) 30 MG/ML injection 15 mg (15 mg Intravenous Given 04/10/18 1424)   Patient's symptoms improved significantly with treatment in the MAU.  Initial Impression / Assessment and Plan / ED Course  I have reviewed the triage vital signs and the nursing notes. 22 y.o. female here with abdominal cramping with menses stable for d/c without heavy bleeding and symptoms improved with treatment in MAU. Patient given her Depo Provera injection while here today. She will f/u with Dr. Dion BodyVarnado or return here for worsening symptoms.   Final Clinical Impressions(s) / ED Diagnoses   Final diagnoses:  Dysmenorrhea    ED Discharge Orders         Ordered    naproxen (NAPROSYN) 375 MG tablet  2 times daily     04/10/18 1554    ondansetron (ZOFRAN) 4 MG tablet  Every 6 hours PRN     04/10/18 1554    Increase activity slowly     04/10/18 1556    Diet - low sodium heart healthy     04/10/18 1556

## 2018-04-10 NOTE — MAU Note (Signed)
Hx of endometriosis   Came on period on Friday and has had n/v since then, unable to keep anything down  Lower abdominal pain, stabbing, 7/10  Having heavy bleeding, changing a pad every 2 hours, states they are saturated, quarter sized clots

## 2018-04-11 LAB — GC/CHLAMYDIA PROBE AMP (~~LOC~~) NOT AT ARMC
Chlamydia: NEGATIVE
Neisseria Gonorrhea: NEGATIVE

## 2018-06-07 ENCOUNTER — Ambulatory Visit: Payer: Federal, State, Local not specified - PPO | Admitting: Nurse Practitioner

## 2018-08-21 ENCOUNTER — Telehealth: Payer: Federal, State, Local not specified - PPO | Admitting: Nurse Practitioner

## 2018-08-21 DIAGNOSIS — M545 Low back pain, unspecified: Secondary | ICD-10-CM

## 2018-08-21 MED ORDER — PREDNISONE 20 MG PO TABS: ORAL_TABLET | ORAL | 0 refills | Status: DC

## 2018-08-21 MED ORDER — CYCLOBENZAPRINE HCL 10 MG PO TABS: 10.0000 mg | ORAL_TABLET | Freq: Three times a day (TID) | ORAL | 1 refills | Status: DC | PRN

## 2018-08-21 NOTE — Progress Notes (Signed)

## 2018-08-24 DIAGNOSIS — N809 Endometriosis, unspecified: Secondary | ICD-10-CM | POA: Diagnosis not present

## 2018-09-09 DIAGNOSIS — N949 Unspecified condition associated with female genital organs and menstrual cycle: Secondary | ICD-10-CM | POA: Diagnosis not present

## 2018-09-09 DIAGNOSIS — N803 Endometriosis of pelvic peritoneum: Secondary | ICD-10-CM | POA: Diagnosis not present

## 2018-09-16 DIAGNOSIS — N803 Endometriosis of pelvic peritoneum: Secondary | ICD-10-CM | POA: Diagnosis not present

## 2018-09-16 DIAGNOSIS — H04123 Dry eye syndrome of bilateral lacrimal glands: Secondary | ICD-10-CM | POA: Diagnosis not present

## 2018-09-16 DIAGNOSIS — Z3141 Encounter for fertility testing: Secondary | ICD-10-CM | POA: Diagnosis not present

## 2018-09-16 DIAGNOSIS — N949 Unspecified condition associated with female genital organs and menstrual cycle: Secondary | ICD-10-CM | POA: Diagnosis not present

## 2018-09-16 DIAGNOSIS — Z3202 Encounter for pregnancy test, result negative: Secondary | ICD-10-CM | POA: Diagnosis not present

## 2019-04-24 DIAGNOSIS — Z3042 Encounter for surveillance of injectable contraceptive: Secondary | ICD-10-CM | POA: Diagnosis not present

## 2019-04-24 DIAGNOSIS — Z3202 Encounter for pregnancy test, result negative: Secondary | ICD-10-CM | POA: Diagnosis not present

## 2019-05-05 DIAGNOSIS — I4891 Unspecified atrial fibrillation: Secondary | ICD-10-CM

## 2019-05-05 HISTORY — DX: Unspecified atrial fibrillation: I48.91

## 2019-05-11 DIAGNOSIS — M545 Low back pain, unspecified: Secondary | ICD-10-CM | POA: Insufficient documentation

## 2019-05-21 ENCOUNTER — Other Ambulatory Visit (INDEPENDENT_AMBULATORY_CARE_PROVIDER_SITE_OTHER): Payer: Self-pay | Admitting: Nurse Practitioner

## 2019-05-25 ENCOUNTER — Encounter: Payer: Self-pay | Admitting: Allergy & Immunology

## 2019-05-25 ENCOUNTER — Ambulatory Visit: Payer: Managed Care, Other (non HMO) | Admitting: Allergy & Immunology

## 2019-05-25 ENCOUNTER — Other Ambulatory Visit: Payer: Self-pay

## 2019-05-25 VITALS — BP 120/82 | HR 116 | Temp 97.8°F | Resp 16 | Ht 64.0 in | Wt 144.8 lb

## 2019-05-25 DIAGNOSIS — J3089 Other allergic rhinitis: Secondary | ICD-10-CM

## 2019-05-25 DIAGNOSIS — J302 Other seasonal allergic rhinitis: Secondary | ICD-10-CM

## 2019-05-25 DIAGNOSIS — J4530 Mild persistent asthma, uncomplicated: Secondary | ICD-10-CM | POA: Diagnosis not present

## 2019-05-25 DIAGNOSIS — T7800XA Anaphylactic reaction due to unspecified food, initial encounter: Secondary | ICD-10-CM | POA: Insufficient documentation

## 2019-05-25 DIAGNOSIS — T7800XD Anaphylactic reaction due to unspecified food, subsequent encounter: Secondary | ICD-10-CM

## 2019-05-25 MED ORDER — LEVOCETIRIZINE DIHYDROCHLORIDE 5 MG PO TABS: 5.0000 mg | ORAL_TABLET | Freq: Every evening | ORAL | 5 refills | Status: DC

## 2019-05-25 MED ORDER — BREO ELLIPTA 100-25 MCG/INH IN AEPB: 1.0000 | INHALATION_SPRAY | Freq: Every day | RESPIRATORY_TRACT | 5 refills | Status: DC

## 2019-05-25 MED ORDER — ALBUTEROL SULFATE HFA 108 (90 BASE) MCG/ACT IN AERS: 2.0000 | INHALATION_SPRAY | RESPIRATORY_TRACT | 1 refills | Status: DC | PRN

## 2019-05-25 NOTE — Addendum Note (Signed)
Addended by: Teressa Senter on: 05/25/2019 03:57 PM   Modules accepted: Orders

## 2019-05-25 NOTE — Patient Instructions (Addendum)
1. Anaphylactic shock due to food, subsequent encounter - Testing to the seafood as well as all of the spices we tested was negative. - We are going to get blood work to the ingredients of [employer] which we did not test. - We are ordering labs, so please allow 1-2 weeks for the results to come back. - With the newly implemented Cures Act, the labs might be visible to you at the same time that they become visible to me. - However, I will not address the results until all of the results come  back, so please be patient.  - We will hold off on EpiPen at this time since your sister has some if needed.  2. Seasonal and perennial allergic rhinitis - Testing today showed: grasses, ragweed, indoor molds, outdoor molds and dust mites - Copy of test results provided.  - Avoidance measures provided. - Continue with: nasal saline rinses - Start taking: Xyzal (levocetirizine) 5mg  tablet once daily and Flonase (fluticasone) one spray per nostril daily - You can use an extra dose of the antihistamine, if needed, for breakthrough symptoms.  - Consider nasal saline rinses 1-2 times daily to remove allergens from the nasal cavities as well as help with mucous clearance (this is especially helpful to do before the nasal sprays are given) - Consider allergy shots as a means of long-term control. - Allergy shots "re-train" and "reset" the immune system to ignore environmental allergens and decrease the resulting immune response to those allergens (sneezing, itchy watery eyes, runny nose, nasal congestion, etc).    - Allergy shots improve symptoms in 75-85% of patients.  - We can discuss more at the next appointment if the medications are not working for you.  3. Mild persistent asthma, uncomplicated - Lung testing looked good today. - Since the use of your albuterol has increased, we are going to add on a daily controller medication containing an inhaled steroid and a long acting albuterol (Breo). - This may help  with tolerability of the mask as well.  - Daily controller medication(s): Breo 100/48mcg one puff once daily - Prior to physical activity: albuterol 2 puffs 10-15 minutes before physical activity. - Rescue medications: albuterol 4 puffs every 4-6 hours as needed - Asthma control goals:  * Full participation in all desired activities (may need albuterol before activity) * Albuterol use two time or less a week on average (not counting use with activity) * Cough interfering with sleep two time or less a month * Oral steroids no more than once a year * No hospitalizations  4. Return in about 2 months (around 07/23/2019). This can be an in-person, a virtual Webex or a telephone follow up visit.   Please inform 07/25/2019 of any Emergency Department visits, hospitalizations, or changes in symptoms. Call us before going to the ED for breathing or allergy symptoms since we might be able to fit you in for a sick visit. Feel free to contact us anytime with any questions, problems, or concerns.  It was a pleasure to meet you today!  Websites that have reliable patient information: 1. American Academy of Asthma, Allergy, and Immunology: www.aaaai.org 2. Food Allergy Research and Education (FARE): foodallergy.org 3. Mothers of Asthmatics: http://www.asthmacommunitynetwork.org 4. American College of Allergy, Asthma, and Immunology: www.acaai.org   COVID-19 Vaccine Information can be found at: [place] For questions related to vaccine distribution or appointments, please email [email or web address] or call [phone].     "Like" [phone] on Facebook and Instagram for our latest updates!  Make sure you are registered to vote! If you have moved or changed any of your contact information, you will need to get this updated before voting!  In some cases, you MAY be able to register to vote online:  [email or web address]    Reducing Pollen Exposure  The American Academy of Allergy, Asthma and Immunology suggests the following steps to reduce your exposure to pollen during allergy seasons.    1. Do not hang sheets or clothing out to dry; pollen may collect on these items. 2. Do not mow lawns or spend time around freshly cut grass; mowing stirs up pollen. 3. Keep windows closed at night.  Keep car windows closed while driving. 4. Minimize morning activities outdoors, a time when pollen counts are usually at their highest. 5. Stay indoors as much as possible when pollen counts or humidity is high and on windy days when pollen tends to remain in the air longer. 6. Use air conditioning when possible.  Many air conditioners have filters that trap the pollen spores. 7. Use a HEPA room air filter to remove pollen form the indoor air you breathe.  Control of Mold Allergen   Mold and fungi can grow on a variety of surfaces provided certain temperature and moisture conditions exist.  Outdoor molds grow on plants, decaying vegetation and soil.  The major outdoor mold, Alternaria and Cladosporium, are found in very high numbers during hot and dry conditions.  Generally, a late Summer - Fall peak is seen for common outdoor fungal spores.  Rain will temporarily lower outdoor mold spore count, but counts rise rapidly when the rainy period ends.  The most important indoor molds are Aspergillus and Penicillium.  Dark, humid and poorly ventilated basements are ideal sites for mold growth.  The next most common sites of mold growth are the bathroom and the kitchen.  Outdoor (Seasonal) Mold Control  Positive outdoor molds via skin testing: Bipolaris (Helminthsporium), Drechslera (Curvalaria) and Mucor  1. Use air conditioning and keep windows closed 2. Avoid exposure to decaying vegetation. 3. Avoid leaf raking. 4. Avoid grain handling. 5. Consider wearing a face mask if  working in moldy areas.  6.   Indoor (Perennial) Mold Control   Positive indoor molds via skin testing: Aspergillus, Penicillium, Fusarium, Aureobasidium (Pullulara) and Rhizopus  1. Maintain humidity below 50%. 2. Clean washable surfaces with 5% bleach solution. 3. Remove sources e.g. contaminated carpets.     Control of Dust Mite Allergen    Dust mites play a major role in allergic asthma and rhinitis.  They occur in environments with high humidity wherever human skin is found.  Dust mites absorb humidity from the atmosphere (ie, they do not drink) and feed on organic matter (including shed human and animal skin).  Dust mites are a microscopic type of insect that you cannot see with the naked eye.  High levels of dust mites have been detected from mattresses, pillows, carpets, upholstered furniture, bed covers, clothes, soft toys and any woven material.  The principal allergen of the dust mite is found in its feces.  A gram of dust may contain 1,000 mites and 250,000 fecal particles.  Mite antigen is easily measured in the air during house cleaning activities.  Dust mites do not bite and do not cause harm to humans, other than by triggering allergies/asthma.    Ways to decrease your exposure to dust mites in your home:  1. Encase mattresses, box springs and pillows with a mite-impermeable barrier or cover  2. Wash sheets, blankets and drapes weekly in hot water (130 F) with detergent and dry them in a dryer on the hot setting.  3. Have the room cleaned frequently with a vacuum cleaner and a damp dust-mop.  For carpeting or rugs, vacuuming with a vacuum cleaner equipped with a high-efficiency particulate air (HEPA) filter.  The dust mite allergic individual should not be in a room which is being cleaned and should wait 1 hour after cleaning before going into the room. 4. Do not sleep on upholstered furniture (eg, couches).   5. If possible removing carpeting, upholstered furniture and  drapery from the home is ideal.  Horizontal blinds should be eliminated in the rooms where the person spends the most time (bedroom, study, television room).  Washable vinyl, roller-type shades are optimal. 6. Remove all non-washable stuffed toys from the bedroom.  Wash stuffed toys weekly like sheets and blankets above.   7. Reduce indoor humidity to less than 50%.  Inexpensive humidity monitors can be purchased at most hardware stores.  Do not use a humidifier as can make the problem worse and are not recommended.

## 2019-05-25 NOTE — Progress Notes (Signed)
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 Anaphylactic shock due to food, subsequent encounter - Testing to the seafood as well as all of the spices we tested was negative. - We are going to get blood work to the ingredients of [employer] which we did not test. - We are ordering labs, so please allow 1-2 weeks for the results to come back. - With the newly implemented Cures Act, the labs might be visible to you at the same time that they become visible to me. - However, I will not address the results until all of the results come  back, so please be patient.  - We will hold off on EpiPen at this time since your sister has some if needed.  2. Seasonal and perennial allergic rhinitis - Testing today showed: grasses, ragweed, indoor molds, outdoor molds and dust mites - Copy of test results provided.  - Avoidance measures provided. - Continue with: nasal saline rinses - Start taking: Xyzal (levocetirizine) 5mg  tablet once daily and Flonase (fluticasone) one spray per nostril daily - You can use an extra dose of the antihistamine, if needed, for breakthrough symptoms.  - Consider nasal saline rinses 1-2 times daily to remove allergens from the nasal cavities as well as help with mucous clearance (this is especially helpful to do before the nasal sprays are given) - Consider allergy shots as a means of long-term control. - Allergy shots "re-train" and "reset" the immune system to ignore environmental allergens and decrease the resulting immune response to those allergens (sneezing, itchy watery eyes, runny nose, nasal congestion, etc).    - Allergy shots improve symptoms in 75-85% of patients.  - We can discuss more at the next  appointment if the medications are not working for you.  3. Mild persistent asthma, uncomplicated - Lung testing looked good today. - Since the use of your albuterol has increased, we are going to add on a daily controller medication containing an inhaled steroid and a long acting albuterol (Breo). - This may help with tolerability of the mask as well.  - Daily controller medication(s): Breo 100/42mcg one puff once daily - Prior to physical activity: albuterol 2 puffs 10-15 minutes before physical activity. - Rescue medications: albuterol 4 puffs every 4-6 hours as needed - Asthma control goals:  * Full participation in all desired activities (may need albuterol before activity) * Albuterol use two time or less a week on average (not counting use with activity) * Cough interfering with sleep two time or less a month * Oral steroids no more than once a year * No hospitalizations  4. Return in about 2 months (around 07/23/2019). This can be an in-person, a virtual Webex or a telephone follow up visit.  Subjective:   Joyce Franco is a 24 y.o. female presenting today for evaluation of  Chief Complaint  Patient presents with  . Allergic Rhinitis     dry nose  . Asthma    SOB  . Food Intolerance    seafood? makes her mouth, lips, and tongue swell,   . Urticaria    on her arms    Joyce Franco has a history of the following: Patient Active Problem List   Diagnosis Date Noted  .  Mild persistent asthma, uncomplicated 27/07/5007  . Seasonal and perennial allergic rhinitis 05/25/2019  . Anaphylactic shock due to adverse food reaction 05/25/2019    History obtained from: chart review and patient.  Carolynn Comment was referred by Karleen Dolphin, MD.     Joyce Franco is a 24 y.o. female presenting for an evaluation of multiple atopic complaints.   Asthma/Respiratory Symptom History: She was using her rescue inhaler a couple of times per week even before COVID. It has become worse and worse  since COVID started and she started using her mask. She was on Flovent in the past when she was younger. She had prednisone in the past but he history is vague. She has not needed to go to the ED for her breathing. She was never intubated for her breathing. But she did have her "lungs drained" when she was 61 or 24 years of age. She thinks that she had pneumonia. She does not wake up coughing at night.   Allergic Rhinitis Symptom History: She does report itchy watery eyes and runny nose. She mostly has a dry nose with congestion. One nostril is always congested (always the right side). She has never seen an ENT or had surgery.   Food Allergy Symptom History: She reactions when she eats seafoods. But this is only when she eats seafood that is season with [place].  She can tolerate seafood when she is not with other spices or when it is not season.  It is difficult to get an idea of what is in [facility], as it is listed as "other spices". However, with some Internet searching it seems that the spice mix contains bay leaves, nutmeg, black pepper, smoked paprika, dry mustard, ginger, allspice, cardamom, cinnamon, cayenne pepper, and salt.  Otherwise, there is no history of other atopic diseases, including drug allergies, stinging insect allergies, eczema, urticaria or contact dermatitis. There is no significant infectious history. Vaccinations are up to date.    Past Medical History: Patient Active Problem List   Diagnosis Date Noted  . Mild persistent asthma, uncomplicated 38/18/2993  . Seasonal and perennial allergic rhinitis 05/25/2019  . Anaphylactic shock due to adverse food reaction 05/25/2019    Medication List:  Allergies as of 05/25/2019      Reactions   Ibuprofen Nausea And Vomiting      Medication List       Accurate as of May 25, 2019 12:36 PM. If you have any questions, ask your nurse or doctor.        STOP taking these medications   acyclovir 200 MG capsule Commonly known as:  Zovirax Stopped by: Valentina Shaggy, MD   cyclobenzaprine 10 MG tablet Commonly known as: FLEXERIL Stopped by: Valentina Shaggy, MD   naproxen 375 MG tablet Commonly known as: NAPROSYN Stopped by: Valentina Shaggy, MD   NYQUIL SEVERE COLD/FLU PO Stopped by: Valentina Shaggy, MD   oxyCODONE-acetaminophen 5-325 MG tablet Commonly known as: PERCOCET/ROXICET Stopped by: Valentina Shaggy, MD   predniSONE 20 MG tablet Commonly known as: Deltasone Stopped by: Valentina Shaggy, MD   promethazine 12.5 MG tablet Commonly known as: PHENERGAN Stopped by: Valentina Shaggy, MD   promethazine 25 MG suppository Commonly known as: PHENERGAN Stopped by: Valentina Shaggy, MD   tiZANidine 2 MG tablet Commonly known as: ZANAFLEX Stopped by: Valentina Shaggy, MD   traMADol 50 MG tablet Commonly known as: ULTRAM Stopped by: Valentina Shaggy, MD   triamcinolone cream  0.1 % Commonly known as: KENALOG Stopped by: Alfonse Spruce, MD     TAKE these medications   albuterol 108 (90 Base) MCG/ACT inhaler Commonly known as: VENTOLIN HFA Inhale 2 puffs into the lungs every 6 (six) hours as needed. Reported on 05/19/2015   chlorhexidine 0.12 % solution Commonly known as: PERIDEX Use as directed 15 mLs in the mouth or throat 2 (two) times daily.   fluticasone 220 MCG/ACT inhaler Commonly known as: FLOVENT HFA Inhale 1 puff into the lungs 2 (two) times daily as needed (shortness of breath). Reported on 05/19/2015   medroxyPROGESTERone 150 MG/ML injection Commonly known as: DEPO-PROVERA Inject 1 mL (150 mg total) into the muscle once.   montelukast 10 MG tablet Commonly known as: SINGULAIR Take 10 mg by mouth daily as needed (allergies). Reported on 05/19/2015   ondansetron 4 MG tablet Commonly known as: ZOFRAN Take 1 tablet (4 mg total) by mouth every 6 (six) hours as needed for nausea or vomiting.       Birth History:  non-contributory  Developmental History: non-contributory  Past Surgical History: Past Surgical History:  Procedure Laterality Date  . ABLATION ON ENDOMETRIOSIS  11/08/2014   Procedure: ABLATION ON ENDOMETRIOSIS;  Surgeon: Geryl Rankins, MD;  Location: [facility];  Service: Gynecology;;  . [name]  11/08/2014   Procedure: CHROMOPERTUBATION;  Surgeon: Geryl Rankins, MD;  Location: [facility];  Service: Gynecology;;  . FOOT SURGERY    . LAPAROSCOPY N/A 11/08/2014   Procedure: LAPAROSCOPY DIAGNOSTIC with peritoneal  biopsy;  Surgeon: Geryl Rankins, MD;  Location: [facility];  Service: Gynecology;  Laterality: N/A;     Family History: Family History  Problem Relation Age of Onset  . Healthy Mother   . Healthy Father      Social History: Durinda lives at home in a house that is 26 years old.  There is carpeting and tile in the main living areas and carpeting in the bedrooms.  They have electric heating and central cooling.  There is a dog inside of the home.  She does have dust mite covers on her bedding.  There is no tobacco exposure.  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   Review of Systems  Constitutional: Negative.  Negative for chills, fever, malaise/fatigue and weight loss.  HENT: Negative.  Negative for congestion, ear discharge, ear pain, sinus pain and sore throat.   Eyes: Negative for pain, discharge and redness.  Respiratory: Negative for cough, sputum production, shortness of breath and wheezing.   Cardiovascular: Negative.  Negative for chest pain and palpitations.  Gastrointestinal: Negative for abdominal pain, constipation, diarrhea, heartburn, nausea and vomiting.  Skin: Negative.  Negative for itching and rash.  Neurological: Negative for dizziness and headaches.  Endo/Heme/Allergies: Positive for environmental allergies. Does not bruise/bleed easily.       Positive for food allergies.       Objective:   Blood pressure  120/82, pulse (!) 116, temperature 97.8 F (36.6 C), temperature source Temporal, resp. rate 16, height 5\' 4"  (1.626 m), weight 144 lb 12.8 oz (65.7 kg), SpO2 98 %. Body mass index is 24.85 kg/m.   Physical Exam:   Physical Exam  Constitutional: She appears well-developed.  Pleasant female.  HENT:  Head: Normocephalic and atraumatic.  Right Ear: Tympanic membrane, external ear and ear canal normal. No drainage, swelling or tenderness. Tympanic membrane is not injected, not scarred, not erythematous, not retracted and not bulging.  Left Ear: Tympanic  membrane, external ear and ear canal normal. No drainage, swelling or tenderness. Tympanic membrane is not injected, not scarred, not erythematous, not retracted and not bulging.  Nose: Mucosal edema and rhinorrhea present. No nasal deformity or septal deviation. No epistaxis. Right sinus exhibits no maxillary sinus tenderness and no frontal sinus tenderness. Left sinus exhibits no maxillary sinus tenderness and no frontal sinus tenderness.  Mouth/Throat: Uvula is midline and oropharynx is clear and moist. Mucous membranes are not pale and not dry.  Turbinates are edematous.  Eyes: Pupils are equal, round, and reactive to light. Conjunctivae and EOM are normal. Right eye exhibits no chemosis and no discharge. Left eye exhibits no chemosis and no discharge. Right conjunctiva is not injected. Left conjunctiva is not injected.  No allergic shiners.  Cardiovascular: Normal rate, regular rhythm and normal heart sounds.  Respiratory: Effort normal and breath sounds normal. No accessory muscle usage. No tachypnea. No respiratory distress. She has no wheezes. She has no rhonchi. She has no rales. She exhibits no tenderness.  Moving air well in all lung fields.  No increased work of breathing.  GI: There is no abdominal tenderness. There is no rebound and no guarding.  Lymphadenopathy:       Head (right side): No submandibular, no tonsillar and no  occipital adenopathy present.       Head (left side): No submandibular, no tonsillar and no occipital adenopathy present.    She has no cervical adenopathy.  Neurological: She is alert.  Skin: No abrasion, no petechiae and no rash noted. Rash is not papular, not vesicular and not urticarial. No erythema. No pallor.  No eczematous or urticarial lesions noted.  Psychiatric: She has a normal mood and affect.     Diagnostic studies:    Spirometry: results normal (FEV1: 3.009/109%, FVC: 3.80/116%, FEV1/FVC: 81%).    Spirometry consistent with normal pattern.   Allergy Studies:    Airborne Adult Perc - 05/25/19 1015    Time Antigen Placed  1015    Allergen Manufacturer  Waynette Buttery    Location  Back    Number of Test  59    Panel 1  Select    1. Control-Buffer 50% Glycerol  Negative    2. Control-Histamine 1 mg/ml  2+    3. Albumin saline  Negative    4. Bahia  Negative    5. [place]  Negative    6. Johnson  Negative    7. Kentucky Blue  Negative    8. Meadow Fescue  Negative    9. Perennial Rye  Negative    10. Sweet Vernal  Negative    11. Timothy  Negative    12. Cocklebur  Negative    13. Burweed Marshelder  Negative    14. Ragweed, short  Negative    15. Ragweed, Giant  Negative    16. Plantain,  English  Negative    17. Lamb's Quarters  Negative    18. Sheep Sorrell  Negative    19. Rough Pigweed  Negative    20. Marsh Elder, Rough  Negative    21. Mugwort, Common  Negative    22. Ash mix  Negative    23. Birch mix  Negative    24. Beech American  Negative    25. Box, Elder  Negative    26. Cedar, red  Negative    27. Cottonwood, [place]  Negative    28. Elm mix  Negative    29. Hickory mix  Negative    30. Maple mix  Negative    31. Oak, Guinea-BissauEastern mix  Negative    32. Pecan Pollen  Negative    33. Pine mix  Negative    34. Sycamore Eastern  Negative    35. Walnut, Black Pollen  Negative    36. Alternaria alternata  Negative    37. Cladosporium Herbarum  Negative     38. Aspergillus mix  Negative    39. Penicillium mix  Negative    40. Bipolaris sorokiniana (Helminthosporium)  Negative    41. Drechslera spicifera (Curvularia)  Negative    42. Mucor plumbeus  Negative    43. Fusarium moniliforme  Negative    44. Aureobasidium pullulans (pullulara)  Negative    45. Rhizopus oryzae  Negative    46. Botrytis cinera  Negative    47. Epicoccum nigrum  Negative    48. Phoma betae  Negative    49. Candida Albicans  Negative    50. Trichophyton mentagrophytes  Negative    51. Mite, D Farinae  5,000 AU/ml  Negative    52. Mite, D Pteronyssinus  5,000 AU/ml  Negative    53. Cat Hair 10,000 BAU/ml  Negative    54.  Dog Epithelia  Negative    55. Mixed Feathers  Negative    56. Horse Epithelia  Negative    57. Cockroach, German  Negative    58. Mouse  Negative    59. Tobacco Leaf  Negative     Intradermal - 05/25/19 1129    Time Antigen Placed  1110    Allergen Manufacturer  Waynette ButteryGreer    Location  Arm    Number of Test  15    Intradermal  Select    Control  Negative    [place] Southern Territ[place]  1+    Johnson  Negative    7 Grass  Negative    Ragweed mix  1+    Weed mix  Negative    Tree mix  Negative    Mold 1  Negative    Mold 2  1+    Mold 3  1+    Mold 4  1+    Cat  Negative    Dog  Negative    Cockroach  Negative    Mite mix  3+     Food Adult Perc - 05/25/19 1000    Time Antigen Placed  1016    Allergen Manufacturer  Greer    Location  Back    Number of allergen test  20    Panel 2  Select    Control-Histamine 1 mg/ml  2+    5. Milk, cow  Negative    7. Casein  Negative    18. Catfish  Negative    19. Bass  Negative    20. Trout  Negative    21. Tuna  Negative    22. Salmon  Negative    23. Flounder  Negative    24. Codfish  Negative    25. Shrimp  Negative    26. Crab  Negative    27. Lobster  Negative    28. Oyster  Negative    29. Scallops  Negative    67. Cinnamon  Negative    69. Ginger  Negative    70. Garlic  Negative    71.  Pepper, black  Negative    72. Mustard  Negative       Allergy testing results were  read and interpreted by myself, documented by clinical staff.         Salvatore Marvel, MD [facility] [place]

## 2019-05-27 LAB — F278-IGE BAYLEAF (LAUREL): F278-IgE Bayleaf (Laurel): <0.1 kU/L

## 2019-05-27 LAB — ALLERGEN,CHILI PEPPER,RF279: F279-IgE Chili Pepper: <0.1 kU/L

## 2019-05-27 LAB — ALLERGEN CELERY: Allergen Celery IgE: <0.1 kU/L

## 2019-05-27 LAB — ALLERGEN,GRN PEPPER,PAPRIKA,F218: Paprika IgE: <0.1 kU/L

## 2019-05-27 LAB — ALLERGEN, NUTMEG, RF282: F282-IgE Nutmeg: <0.1 kU/L

## 2019-05-30 LAB — ALLERGEN, CARDAMON SEED
Cardamon Seed IgE*: <0.1 kU/L (ref <0.35)
Class Interpretation: 0

## 2019-05-30 LAB — F339-IGE ALLSPICE: F339-IgE Allspice: <0.1 kU/L

## 2019-07-27 ENCOUNTER — Ambulatory Visit: Payer: Managed Care, Other (non HMO) | Admitting: Allergy & Immunology

## 2019-10-12 ENCOUNTER — Encounter (HOSPITAL_COMMUNITY): Payer: Self-pay

## 2019-10-12 ENCOUNTER — Emergency Department (HOSPITAL_COMMUNITY): Payer: Federal, State, Local not specified - PPO

## 2019-10-12 ENCOUNTER — Inpatient Hospital Stay (HOSPITAL_COMMUNITY)
Admit: 2019-10-12 | Discharge: 2019-10-15 | DRG: 309 | Disposition: A | Discharge status: Home / Self Care | Payer: Federal, State, Local not specified - PPO | Attending: Internal Medicine | Admitting: Internal Medicine

## 2019-10-12 DIAGNOSIS — Z23 Encounter for immunization: Secondary | ICD-10-CM

## 2019-10-12 DIAGNOSIS — R739 Hyperglycemia, unspecified: Secondary | ICD-10-CM | POA: Diagnosis present

## 2019-10-12 DIAGNOSIS — E860 Dehydration: Secondary | ICD-10-CM | POA: Diagnosis not present

## 2019-10-12 DIAGNOSIS — I480 Paroxysmal atrial fibrillation: Secondary | ICD-10-CM | POA: Diagnosis not present

## 2019-10-12 DIAGNOSIS — Z79899 Other long term (current) drug therapy: Secondary | ICD-10-CM | POA: Diagnosis not present

## 2019-10-12 DIAGNOSIS — J4530 Mild persistent asthma, uncomplicated: Secondary | ICD-10-CM | POA: Diagnosis present

## 2019-10-12 DIAGNOSIS — R109 Unspecified abdominal pain: Secondary | ICD-10-CM | POA: Diagnosis not present

## 2019-10-12 DIAGNOSIS — I4891 Unspecified atrial fibrillation: Secondary | ICD-10-CM

## 2019-10-12 DIAGNOSIS — T508X5A Adverse effect of diagnostic agents, initial encounter: Secondary | ICD-10-CM | POA: Diagnosis not present

## 2019-10-12 DIAGNOSIS — N809 Endometriosis, unspecified: Secondary | ICD-10-CM | POA: Diagnosis not present

## 2019-10-12 DIAGNOSIS — Z20822 Contact with and (suspected) exposure to covid-19: Secondary | ICD-10-CM | POA: Diagnosis not present

## 2019-10-12 DIAGNOSIS — I4819 Other persistent atrial fibrillation: Secondary | ICD-10-CM | POA: Diagnosis not present

## 2019-10-12 DIAGNOSIS — R1084 Generalized abdominal pain: Secondary | ICD-10-CM

## 2019-10-12 DIAGNOSIS — E872 Acidosis, unspecified: Secondary | ICD-10-CM | POA: Diagnosis present

## 2019-10-12 DIAGNOSIS — E875 Hyperkalemia: Secondary | ICD-10-CM | POA: Diagnosis present

## 2019-10-12 DIAGNOSIS — Z03818 Encounter for observation for suspected exposure to other biological agents ruled out: Secondary | ICD-10-CM | POA: Diagnosis not present

## 2019-10-12 LAB — LIPASE, BLOOD: Lipase: 22 U/L (ref 11–51)

## 2019-10-12 LAB — COMPREHENSIVE METABOLIC PANEL
ALT: 21 U/L (ref 0–44)
AST: 32 U/L (ref 15–41)
Albumin: 4.4 g/dL (ref 3.5–5.0)
Alkaline Phosphatase: 47 U/L (ref 38–126)
Anion gap: 18 — ABNORMAL HIGH (ref 5–15)
BUN: 11 mg/dL (ref 6–20)
CO2: 18 mmol/L — ABNORMAL LOW (ref 22–32)
Calcium: 9.6 mg/dL (ref 8.9–10.3)
Chloride: 104 mmol/L (ref 98–111)
Creatinine, Ser: 0.97 mg/dL (ref 0.44–1.00)
GFR calc Af Amer: >60 mL/min (ref >60)
GFR calc non Af Amer: >60 mL/min (ref >60)
Glucose, Bld: 129 mg/dL — ABNORMAL HIGH (ref 70–99)
Potassium: 5.2 mmol/L — ABNORMAL HIGH (ref 3.5–5.1)
Sodium: 140 mmol/L (ref 135–145)
Total Bilirubin: 1.6 mg/dL — ABNORMAL HIGH (ref 0.3–1.2)
Total Protein: 7.5 g/dL (ref 6.5–8.1)

## 2019-10-12 LAB — CBC WITH DIFFERENTIAL/PLATELET
Abs Immature Granulocytes: 0.03 10*3/uL (ref 0.00–0.07)
Basophils Absolute: 0 10*3/uL (ref 0.0–0.1)
Basophils Relative: 0 %
Eosinophils Absolute: 0.1 10*3/uL (ref 0.0–0.5)
Eosinophils Relative: 1 %
HCT: 44.9 % (ref 36.0–46.0)
Hemoglobin: 15.3 g/dL — ABNORMAL HIGH (ref 12.0–15.0)
Immature Granulocytes: 0 %
Lymphocytes Relative: 24 %
Lymphs Abs: 1.9 10*3/uL (ref 0.7–4.0)
MCH: 28.4 pg (ref 26.0–34.0)
MCHC: 34.1 g/dL (ref 30.0–36.0)
MCV: 83.5 fL (ref 80.0–100.0)
Monocytes Absolute: 0.5 10*3/uL (ref 0.1–1.0)
Monocytes Relative: 6 %
Neutro Abs: 5.5 10*3/uL (ref 1.7–7.7)
Neutrophils Relative %: 69 %
Platelets: 307 10*3/uL (ref 150–400)
RBC: 5.38 MIL/uL — ABNORMAL HIGH (ref 3.87–5.11)
RDW: 13.5 % (ref 11.5–15.5)
WBC: 8 10*3/uL (ref 4.0–10.5)
nRBC: 0 % (ref 0.0–0.2)

## 2019-10-12 LAB — URINALYSIS, ROUTINE W REFLEX MICROSCOPIC
Bilirubin Urine: NEGATIVE
Glucose, UA: 150 mg/dL — AB
Ketones, ur: 20 mg/dL — AB
Leukocytes,Ua: NEGATIVE
Nitrite: NEGATIVE
Protein, ur: 100 mg/dL — AB
Specific Gravity, Urine: 1.046 — ABNORMAL HIGH (ref 1.005–1.030)
pH: 6 (ref 5.0–8.0)

## 2019-10-12 LAB — RAPID URINE DRUG SCREEN, HOSP PERFORMED
Amphetamines: NOT DETECTED
Barbiturates: NOT DETECTED
Benzodiazepines: POSITIVE — AB
Cocaine: NOT DETECTED
Opiates: NOT DETECTED
Tetrahydrocannabinol: POSITIVE — AB

## 2019-10-12 LAB — URINALYSIS, MICROSCOPIC (REFLEX): RBC / HPF: >50 RBC/hpf (ref 0–5)

## 2019-10-12 LAB — TSH: TSH: 0.693 u[IU]/mL (ref 0.350–4.500)

## 2019-10-12 LAB — I-STAT BETA HCG BLOOD, ED (MC, WL, AP ONLY): I-stat hCG, quantitative: <5 m[IU]/mL (ref <5)

## 2019-10-12 LAB — SARS CORONAVIRUS 2 BY RT PCR (HOSPITAL ORDER, PERFORMED IN ~~LOC~~ HOSPITAL LAB): SARS Coronavirus 2: NEGATIVE

## 2019-10-12 MED ORDER — SODIUM CHLORIDE 0.9 % IV SOLN: INTRAVENOUS | Status: AC

## 2019-10-12 MED ORDER — DILTIAZEM HCL 25 MG/5ML IV SOLN
10.0000 mg | Freq: Once | INTRAVENOUS | Status: AC
  Administered 2019-10-12: 10 mg via INTRAVENOUS
  Filled 2019-10-12: qty 5

## 2019-10-12 MED ORDER — SODIUM CHLORIDE 0.9 % IV BOLUS
1000.0000 mL | Freq: Once | INTRAVENOUS | Status: AC
  Administered 2019-10-12: 1000 mL via INTRAVENOUS

## 2019-10-12 MED ORDER — DIPHENHYDRAMINE HCL 50 MG/ML IJ SOLN
INTRAMUSCULAR | Status: AC
  Administered 2019-10-12: 50 mg via INTRAVENOUS
  Filled 2019-10-12: qty 1

## 2019-10-12 MED ORDER — FAMOTIDINE IN NACL 20-0.9 MG/50ML-% IV SOLN
20.0000 mg | Freq: Once | INTRAVENOUS | Status: AC
Start: 2019-10-12 — End: 2019-10-12
  Administered 2019-10-12: 20 mg via INTRAVENOUS
  Filled 2019-10-12: qty 50

## 2019-10-12 MED ORDER — ACETAMINOPHEN 325 MG PO TABS: 650.0000 mg | ORAL_TABLET | Freq: Four times a day (QID) | ORAL | Status: DC | PRN

## 2019-10-12 MED ORDER — HEPARIN BOLUS VIA INFUSION
3800.0000 [IU] | Freq: Once | INTRAVENOUS | Status: AC
  Administered 2019-10-12: 3800 [IU] via INTRAVENOUS
  Filled 2019-10-12: qty 3800

## 2019-10-12 MED ORDER — APIXABAN 5 MG PO TABS
5.0000 mg | ORAL_TABLET | Freq: Two times a day (BID) | ORAL | Status: DC
  Administered 2019-10-13 – 2019-10-15 (×5): 5 mg via ORAL
  Filled 2019-10-12 (×6): qty 1

## 2019-10-12 MED ORDER — METOPROLOL TARTRATE 25 MG PO TABS
25.0000 mg | ORAL_TABLET | Freq: Four times a day (QID) | ORAL | Status: DC
  Administered 2019-10-12 – 2019-10-15 (×11): 25 mg via ORAL
  Filled 2019-10-12 (×11): qty 1

## 2019-10-12 MED ORDER — ONDANSETRON HCL 4 MG/2ML IJ SOLN
4.0000 mg | Freq: Once | INTRAMUSCULAR | Status: AC
  Administered 2019-10-12: 4 mg via INTRAVENOUS
  Filled 2019-10-12: qty 2

## 2019-10-12 MED ORDER — ONDANSETRON HCL 4 MG/2ML IJ SOLN: 4.0000 mg | Freq: Four times a day (QID) | INTRAMUSCULAR | Status: DC | PRN

## 2019-10-12 MED ORDER — HEPARIN (PORCINE) 25000 UT/250ML-% IV SOLN
980.0000 [IU]/h | INTRAVENOUS | Status: DC
  Administered 2019-10-12: 1000 [IU]/h via INTRAVENOUS
  Filled 2019-10-12: qty 250

## 2019-10-12 MED ORDER — METHYLPREDNISOLONE SODIUM SUCC 125 MG IJ SOLR: 125.0000 mg | Freq: Once | INTRAMUSCULAR | Status: AC

## 2019-10-12 MED ORDER — FLUTICASONE FUROATE-VILANTEROL 100-25 MCG/INH IN AEPB
1.0000 | INHALATION_SPRAY | Freq: Every day | RESPIRATORY_TRACT | Status: DC
  Administered 2019-10-13 – 2019-10-15 (×3): 1 via RESPIRATORY_TRACT
  Filled 2019-10-12: qty 28

## 2019-10-12 MED ORDER — METHYLPREDNISOLONE SODIUM SUCC 125 MG IJ SOLR
INTRAMUSCULAR | Status: AC
  Administered 2019-10-12: 125 mg via INTRAVENOUS
  Filled 2019-10-12: qty 2

## 2019-10-12 MED ORDER — DILTIAZEM HCL 25 MG/5ML IV SOLN
20.0000 mg | Freq: Once | INTRAVENOUS | Status: AC
  Administered 2019-10-12: 20 mg via INTRAVENOUS

## 2019-10-12 MED ORDER — APIXABAN 5 MG PO TABS
10.0000 mg | ORAL_TABLET | Freq: Once | ORAL | Status: AC
  Administered 2019-10-12: 10 mg via ORAL
  Filled 2019-10-12 (×2): qty 2

## 2019-10-12 MED ORDER — ACETAMINOPHEN 650 MG RE SUPP: 650.0000 mg | Freq: Four times a day (QID) | RECTAL | Status: DC | PRN

## 2019-10-12 MED ORDER — DOCUSATE SODIUM 100 MG PO CAPS
100.0000 mg | ORAL_CAPSULE | Freq: Two times a day (BID) | ORAL | Status: DC
  Administered 2019-10-13 – 2019-10-14 (×3): 100 mg via ORAL
  Filled 2019-10-12 (×4): qty 1

## 2019-10-12 MED ORDER — ALBUTEROL SULFATE (2.5 MG/3ML) 0.083% IN NEBU: 2.5000 mg | INHALATION_SOLUTION | RESPIRATORY_TRACT | Status: DC | PRN

## 2019-10-12 MED ORDER — ONDANSETRON HCL 4 MG PO TABS
4.0000 mg | ORAL_TABLET | Freq: Four times a day (QID) | ORAL | Status: DC | PRN
  Administered 2019-10-12 – 2019-10-14 (×2): 4 mg via ORAL
  Filled 2019-10-12 (×2): qty 1

## 2019-10-12 MED ORDER — SODIUM CHLORIDE 0.9% FLUSH
3.0000 mL | Freq: Two times a day (BID) | INTRAVENOUS | Status: DC
  Administered 2019-10-13 – 2019-10-15 (×4): 3 mL via INTRAVENOUS

## 2019-10-12 MED ORDER — KETOROLAC TROMETHAMINE 30 MG/ML IJ SOLN
30.0000 mg | Freq: Once | INTRAMUSCULAR | Status: DC
  Filled 2019-10-12: qty 1

## 2019-10-12 MED ORDER — HYDROCODONE-ACETAMINOPHEN 5-325 MG PO TABS
1.0000 | ORAL_TABLET | ORAL | Status: DC | PRN
  Administered 2019-10-12 – 2019-10-15 (×11): 2 via ORAL
  Filled 2019-10-12 (×12): qty 2

## 2019-10-12 MED ORDER — HYDROMORPHONE HCL 1 MG/ML IJ SOLN
1.0000 mg | Freq: Once | INTRAMUSCULAR | Status: AC
  Administered 2019-10-12: 1 mg via INTRAVENOUS
  Filled 2019-10-12: qty 1

## 2019-10-12 MED ORDER — DIPHENHYDRAMINE HCL 50 MG/ML IJ SOLN: 50.0000 mg | Freq: Once | INTRAMUSCULAR | Status: AC

## 2019-10-12 MED ORDER — KETOROLAC TROMETHAMINE 30 MG/ML IJ SOLN
30.0000 mg | Freq: Once | INTRAMUSCULAR | Status: AC
  Administered 2019-10-12: 30 mg via INTRAVENOUS
  Filled 2019-10-12: qty 1

## 2019-10-12 MED ORDER — MEDROXYPROGESTERONE ACETATE 150 MG/ML IM SUSP
150.0000 mg | Freq: Once | INTRAMUSCULAR | Status: AC
  Administered 2019-10-12: 150 mg via INTRAMUSCULAR
  Filled 2019-10-12: qty 1

## 2019-10-12 MED ORDER — IOHEXOL 300 MG/ML  SOLN
100.0000 mL | Freq: Once | INTRAMUSCULAR | Status: AC | PRN
  Administered 2019-10-12: 100 mL via INTRAVENOUS

## 2019-10-12 NOTE — Progress Notes (Signed)
ANTICOAGULATION CONSULT NOTE - Initial Consult  Pharmacy Consult for heparin Indication: atrial fibrillation  Allergies  Allergen Reactions  . Contrast Media [Iodinated Diagnostic Agents] Hives and Other (See Comments)    Warm feeling in back of throat 10 minutes after contrast  . Ibuprofen Nausea And Vomiting    Patient Measurements: Height: 5\' 4"  (162.6 cm) Weight: 72.6 kg (160 lb) IBW/kg (Calculated) : 54.7 Heparin Dosing Weight: 70 kg  Vital Signs: Temp: 97.7 F (36.5 C) (06/10 1118) Temp Source: Oral (06/10 1118) BP: 118/89 (06/10 1900) Pulse Rate: 98 (06/10 1915)  Labs: Recent Labs    10/12/19 1155  HGB 15.3*  HCT 44.9  PLT 307  CREATININE 0.97    Estimated Creatinine Clearance: 86.6 mL/min (by C-G formula based on SCr of 0.97 mg/dL).   Medical History: Past Medical History:  Diagnosis Date  . Asthma   . Endometriosis, uterus   . Fractured pelvis (HCC)   . Medical history non-contributory   . Urticaria    Assessment: 49 yof with new onset atrial fibrillation. CHADSVASc 1. No anticoagulation PTA. Baseline Hgb 15.3, platelets 307.   Goal of Therapy:  Heparin level 0.3-0.7 units/ml Monitor platelets by anticoagulation protocol: Yes   Plan:  Give 3800 units bolus x 1 Start heparin infusion at 980 units/hr Check anti-Xa level with daily labs and daily while on heparin Continue to monitor H&H and platelets  Follow up cards plans   Thank you,   22, PharmD PGY-1 Pharmacy Resident   Please check amion for clinical pharmacist contact number 10/12/2019,7:44 PM

## 2019-10-12 NOTE — H&P (Signed)
Joyce Franco ZLD:[id number] DOB: November 06, 1995 DOA: 10/12/2019     PCP: Karleen Dolphin, MD   Outpatient Specialists:  NONE   Patient arrived to ER on 10/12/19 at 1110  Patient coming from: home Lives   With family   Chief Complaint: abdominal pain HPI: Joyce Franco is a 24 y.o. female with medical history significant of endometriosis, asthma    Presented with   came in with abdominal pain nausea and emesis unable to keep anything down and started vaginal bleeding this morning no fevers no chills She felt that her abdominal pain is worse because she missed her Depo shot No sick contacts. Eats at home. She reports she usually has same presentation with endometriosis with Nausea and vomiting and generalized pain.  Patient endorses she smokes about a blunt today.  She took some Valium today hoping that would help her pain she is prescribed Valium by her OB/GYN as per patient.  Infectious risk factors:  Reports shortness of breath,   N/V/ abdominal pain,    Has  NOt been vaccinated against COVID   In  ER   COVID TEST  NEGATIVE   Lab Results  Component Value Date   [place] NEGATIVE 10/12/2019    Regarding pertinent Chronic problems:      Asthma -well    controlled on home inhalers/ nebs                    While in ER: In emergency department noted to have atrial fibrillation and ST elevation to suggest acute pericarditis Initially heart rate was in 80s but when her pain increased her heart rate went up to 150 she was started on Cardizem and remained in tachycardic after after 10 mg Cardizem bolus she was given 20 mg bolus and her heart rate improved to 90s given fluid bolus  ER Provider Called:  Cardiology   Dr. Farris Has They Recommend admit to medicine    seen  in ER ER visit was complicated by have an allergic reaction to contrast She required a dose of Benadryl and Solu-Medrol Labs significant for potassium elevated 5.2 bicarb 18 and an elevated anion gap up to  18 Hospitalist was called for admission for  A.fib w RVR  The following Work up has been ordered so far:  Orders Placed This Encounter  Procedures  . SARS Coronavirus 2 by RT PCR (hospital order, performed in [facility] hospital lab) Nasopharyngeal Nasopharyngeal Swab  . CT Abdomen Pelvis W Contrast  . Comprehensive metabolic panel  . Lipase, blood  . CBC with Differential  . Urinalysis, Routine w reflex microscopic  . Rapid urine drug screen (hospital performed)  . Urinalysis, Microscopic (reflex)  . TSH  . Heparin level (unfractionated)  . CBC  . Cardiac monitoring  . Initiate Carrier Fluid Protocol  . Initiate Carrier Fluid Protocol  . Inpatient consult to Cardiology  ALL PATIENTS BEING ADMITTED/HAVING PROCEDURES NEED COVID-19 SCREENING  . Inpatient consult to Cardiology  ALL PATIENTS BEING ADMITTED/HAVING PROCEDURES NEED COVID-19 SCREENING  . heparin per pharmacy consult  . Consult for [place] Admission  ALL PATIENTS BEING ADMITTED/HAVING PROCEDURES NEED COVID-19 SCREENING  . Pulse oximetry, continuous  . I-Stat Beta hCG blood, ED ([facility], [facility], [facility] only)  . ED EKG  . EKG 12-Lead  . Repeat EKG  . EKG 12-Lead  . ECHOCARDIOGRAM COMPLETE  . Saline lock IV  . Insert peripheral IV    Following Medications were ordered in ER: Medications  heparin ADULT infusion 100 units/mL (25000 units/241m sodium chloride 0.45%) (1,000 Units/hr Intravenous New Bag/Given 10/12/19 1957)  sodium chloride 0.9 % bolus 1,000 mL (0 mLs Intravenous Stopped 10/12/19 1326)  ondansetron (ZOFRAN) injection 4 mg (4 mg Intravenous Given 10/12/19 1201)  medroxyPROGESTERone (DEPO-PROVERA) injection 150 mg (150 mg Intramuscular Given 10/12/19 1318)  diltiazem (CARDIZEM) injection 10 mg (10 mg Intravenous Given 10/12/19 1231)  HYDROmorphone (DILAUDID) injection 1 mg (1 mg Intravenous Given 10/12/19 1230)  diltiazem (CARDIZEM) injection 20 mg (20 mg Intravenous Given 10/12/19 1316)  sodium chloride 0.9 %  bolus 1,000 mL (0 mLs Intravenous Stopped 10/12/19 1725)  ketorolac (TORADOL) 30 MG/ML injection 30 mg (30 mg Intravenous Given 10/12/19 1435)  iohexol (OMNIPAQUE) 300 MG/ML solution 100 mL (100 mLs Intravenous Contrast Given 10/12/19 1600)  diphenhydrAMINE (BENADRYL) injection 50 mg (50 mg Intravenous Given 10/12/19 1621)  methylPREDNISolone sodium succinate (SOLU-MEDROL) 125 mg/2 mL injection 125 mg (125 mg Intravenous Given 10/12/19 1621)  famotidine (PEPCID) IVPB 20 mg premix (0 mg Intravenous Stopped 10/12/19 1647)  heparin bolus via infusion 3,800 Units (3,800 Units Intravenous Bolus from Bag 10/12/19 2001)        Significant initial  Findings: Abnormal Labs Reviewed  COMPREHENSIVE METABOLIC PANEL - Abnormal; Notable for the following components:      Result Value   Potassium 5.2 (*)    CO2 18 (*)    Glucose, Bld 129 (*)    Total Bilirubin 1.6 (*)    Anion gap 18 (*)    All other components within normal limits  CBC WITH DIFFERENTIAL/PLATELET - Abnormal; Notable for the following components:   RBC 5.38 (*)    Hemoglobin 15.3 (*)    All other components within normal limits  URINALYSIS, ROUTINE W REFLEX MICROSCOPIC - Abnormal; Notable for the following components:   Color, Urine RED (*)    APPearance HAZY (*)    Specific Gravity, Urine 1.046 (*)    Glucose, UA 150 (*)    Hgb urine dipstick LARGE (*)    Ketones, ur 20 (*)    Protein, ur 100 (*)    All other components within normal limits  RAPID URINE DRUG SCREEN, HOSP PERFORMED - Abnormal; Notable for the following components:   Benzodiazepines POSITIVE (*)    Tetrahydrocannabinol POSITIVE (*)    All other components within normal limits  URINALYSIS, MICROSCOPIC (REFLEX) - Abnormal; Notable for the following components:   Bacteria, UA RARE (*)    All other components within normal limits    Otherwise labs showing:    Recent Labs  Lab 10/12/19 1155  NA 140  K 5.2*  CO2 18*  GLUCOSE 129*  BUN 11  CREATININE 0.97   CALCIUM 9.6    Cr   stable,    Lab Results  Component Value Date   CREATININE 0.97 10/12/2019   CREATININE 0.81 05/07/2015   CREATININE 0.80 08/30/2011    Recent Labs  Lab 10/12/19 1155  AST 32  ALT 21  ALKPHOS 47  BILITOT 1.6*  PROT 7.5  ALBUMIN 4.4   Lab Results  Component Value Date   CALCIUM 9.6 10/12/2019      WBC      Component Value Date/Time   WBC 8.0 10/12/2019 1155   ANC    Component Value Date/Time   NEUTROABS 5.5 10/12/2019 1155     Plt: Lab Results  Component Value Date   PLT 307 10/12/2019          Lab Results  Component Value  Date   [place] NEGATIVE 10/12/2019    Venous  Blood Gas result:ordered   HG/HCT   stable,       Component Value Date/Time   HGB 15.3 (H) 10/12/2019 1155   HCT 44.9 10/12/2019 1155    Recent Labs  Lab 10/12/19 1155  LIPASE 22   No results for input(s): AMMONIA in the last 168 hours.  No components found for: LABALBU   Troponin  ordered Cardiac Panel (last 3 results) No results for input(s): CKTOTAL, CKMB, TROPONINI, RELINDX in the last 72 hours.     ECG: Ordered Personally reviewed by me showing: HR : 110 Rhythm A.fib. W RVR,  ST segment changes QTC 458      UA  no evidence of UTI     Urine analysis:    Component Value Date/Time   COLORURINE RED (A) 10/12/2019 1711   APPEARANCEUR HAZY (A) 10/12/2019 1711   LABSPEC 1.046 (H) 10/12/2019 1711   PHURINE 6.0 10/12/2019 1711   GLUCOSEU 150 (A) 10/12/2019 1711   HGBUR LARGE (A) 10/12/2019 1711   BILIRUBINUR NEGATIVE 10/12/2019 1711   KETONESUR 20 (A) 10/12/2019 1711   PROTEINUR 100 (A) 10/12/2019 1711   UROBILINOGEN 0.2 06/26/2014 1925   NITRITE NEGATIVE 10/12/2019 1711   LEUKOCYTESUR NEGATIVE 10/12/2019 1711    Ordered    CTabd/pelvis -  nonacute      ED Triage Vitals  Enc Vitals Group     BP 10/12/19 1113 (!) 130/91     Pulse Rate 10/12/19 1113 98     Resp 10/12/19 1113 20     Temp 10/12/19 1116 97.7 F (36.5 C)     Temp  Source 10/12/19 1113 Oral     SpO2 10/12/19 1113 100 %     Weight 10/12/19 1113 160 lb (72.6 kg)     Height 10/12/19 1113 5' 4"  (1.626 m)     Head Circumference --      Peak Flow --      Pain Score 10/12/19 1117 10     Pain Loc --      Pain Edu? --      Excl. in [place]? --   TMAX(24)@       Latest  Blood pressure 110/84, pulse (!) 132, temperature 97.7 F (36.5 C), temperature source Oral, resp. rate (!) 21, height 5' 4"  (1.626 m), weight 72.6 kg, SpO2 100 %.    Review of Systems:    Pertinent positives include: fatigue, abdominal pain,  nausea, vomiting, Constitutional:  No weight loss, night sweats, Fevers, chills, weight loss  HEENT:  No headaches, Difficulty swallowing,Tooth/dental problems,Sore throat,  No sneezing, itching, ear ache, nasal congestion, post nasal drip,  Cardio-vascular:  No chest pain, Orthopnea, PND, anasarca, dizziness, palpitations.no Bilateral lower extremity swelling  GI:  No heartburn, indigestion,  diarrhea, change in bowel habits, loss of appetite, melena, blood in stool, hematemesis Resp:  no shortness of breath at rest. No dyspnea on exertion, No excess mucus, no productive cough, No non-productive cough, No coughing up of blood.No change in color of mucus. No wheezing. Skin:  no rash or lesions. No jaundice GU:  no dysuria, change in color of urine, no urgency or frequency. No straining to urinate.  No flank pain.  Musculoskeletal:  No joint pain or no joint swelling. No decreased range of motion. No back pain.  Psych:  No change in mood or affect. No depression or anxiety. No memory loss.  Neuro: no localizing neurological complaints, no tingling, no  weakness, no double vision, no gait abnormality, no slurred speech, no confusion  All systems reviewed and apart from [place] all are negative  Past Medical History:   Past Medical History:  Diagnosis Date  . Asthma   . Endometriosis, uterus   . Fractured pelvis ([place])   . Medical history  non-contributory   . Urticaria      Past Surgical History:  Procedure Laterality Date  . ABLATION ON ENDOMETRIOSIS  11/08/2014   Procedure: ABLATION ON ENDOMETRIOSIS;  Surgeon: Thurnell Lose, MD;  Location: [facility];  Service: Gynecology;;  . Lynnell Chad  11/08/2014   Procedure: CHROMOPERTUBATION;  Surgeon: Thurnell Lose, MD;  Location: [facility];  Service: Gynecology;;  . FOOT SURGERY    . LAPAROSCOPY N/A 11/08/2014   Procedure: LAPAROSCOPY DIAGNOSTIC with peritoneal  biopsy;  Surgeon: Thurnell Lose, MD;  Location: [facility];  Service: Gynecology;  Laterality: N/A;    Social History:  Ambulatory  Independently      reports that she is a non-smoker but has been exposed to tobacco smoke. She has never used smokeless tobacco. She reports that she does not drink alcohol and does not use drugs.   Family History:   Family History  Problem Relation Age of Onset  . Healthy Mother   . Healthy Father     Allergies: Allergies  Allergen Reactions  . Contrast Media [Iodinated Diagnostic Agents] Hives and Other (See Comments)    Warm feeling in back of throat 10 minutes after contrast  . Ibuprofen Nausea And Vomiting     Prior to Admission medications   Medication Sig Start Date End Date Taking? Authorizing Provider  albuterol (VENTOLIN HFA) 108 (90 Base) MCG/ACT inhaler Inhale 2 puffs into the lungs every 4 (four) hours as needed. 05/25/19  Yes Valentina Shaggy, MD  fluticasone furoate-vilanterol (BREO ELLIPTA) 100-25 MCG/INH AEPB Inhale 1 puff into the lungs daily. 05/25/19  Yes Valentina Shaggy, MD  medroxyPROGESTERone (DEPO-PROVERA) 150 MG/ML injection Inject 1 mL (150 mg total) into the muscle once. Patient taking differently: Inject 150 mg into the muscle every 3 (three) months.  06/26/14  Yes Thurnell Lose, MD  Prenatal Vit-Fe Fumarate-FA (PRENATAL PO) Take 1 tablet by mouth daily.   Yes [provider]  chlorhexidine (PERIDEX) 0.12 % solution Use as directed 15  mLs in the mouth or throat 2 (two) times daily. Patient not taking: Reported on 10/12/2019 05/19/15   Robyn Haber, MD  levocetirizine (XYZAL) 5 MG tablet Take 1 tablet (5 mg total) by mouth every evening. Patient not taking: Reported on 10/12/2019 05/25/19   Valentina Shaggy, MD  ondansetron (ZOFRAN) 4 MG tablet Take 1 tablet (4 mg total) by mouth every 6 (six) hours as needed for nausea or vomiting. Patient not taking: Reported on 05/25/2019 04/10/18   Ashley Murrain, NP   Physical Exam: Blood pressure 110/84, pulse (!) 132, temperature 97.7 F (36.5 C), temperature source Oral, resp. rate (!) 21, height 5' 4"  (1.626 m), weight 72.6 kg, SpO2 100 %. 1. General:  in No Acute distress   Chronically ill -appearing 2. Psychological: Alert and  Oriented 3. Head/ENT:     Dry Mucous Membranes                          Head Non traumatic, neck supple                          Poor  Dentition 4. SKIN:   decreased Skin turgor,  Skin clean Dry and intact no rash 5. Heart: Regular rate and rhythm no  Murmur, no Rub or gallop 6. Lungs: Clear to auscultation bilaterally, no wheezes or crackles   7. Abdomen: Soft,  tender, Non distended  obese  bowel sounds present 8. Lower extremities: no clubbing, cyanosis, no edema 9. Neurologically Grossly intact, moving all 4 extremities equally   10. MSK: Normal range of motion   All other LABS:     Recent Labs  Lab 10/12/19 1155  WBC 8.0  NEUTROABS 5.5  HGB 15.3*  HCT 44.9  MCV 83.5  PLT 307     Recent Labs  Lab 10/12/19 1155  NA 140  K 5.2*  CL 104  CO2 18*  GLUCOSE 129*  BUN 11  CREATININE 0.97  CALCIUM 9.6     Recent Labs  Lab 10/12/19 1155  AST 32  ALT 21  ALKPHOS 47  BILITOT 1.6*  PROT 7.5  ALBUMIN 4.4      Cultures:    Component Value Date/Time   SDES URINE, CLEAN CATCH 10/26/2012 0256   SPECREQUEST NONE 10/26/2012 0256   CULT ESCHERICHIA COLI 10/26/2012 0256   REPTSTATUS 10/28/2012 FINAL 10/26/2012 0256       Radiological Exams on Admission: CT Abdomen Pelvis W Contrast  Result Date: 10/12/2019 CLINICAL DATA:  Lower abdominal pain since yesterday. History of endometriosis. EXAM: CT ABDOMEN AND PELVIS WITH CONTRAST TECHNIQUE: Multidetector CT imaging of the abdomen and pelvis was performed using the standard protocol following bolus administration of intravenous contrast. CONTRAST:  17m OMNIPAQUE IOHEXOL 300 MG/ML  SOLN COMPARISON:  None. FINDINGS: Lower chest: The lung bases are clear of acute process. No pleural effusion or pulmonary lesions. The heart is normal in size. No pericardial effusion. The distal esophagus and aorta are unremarkable. Hepatobiliary: No focal hepatic lesions or intrahepatic biliary dilatation. The gallbladder is normal. No common bile duct dilatation. Pancreas: No mass, inflammation or ductal dilatation. Spleen: Normal size.  No focal lesions. Adrenals/Urinary Tract: The adrenal glands and kidneys are unremarkable. No renal, ureteral or bladder calculi or mass. No CT findings suspicious for pyelonephritis. Stomach/Bowel: The stomach, duodenum, small bowel and colon are grossly normal without oral contrast. No acute inflammatory changes, mass lesions or obstructive findings. The terminal ileum is normal. The appendix is normal. Vascular/Lymphatic: The aorta is normal in caliber. No dissection. The branch vessels are patent. The major venous structures are patent. No mesenteric or retroperitoneal mass or adenopathy. Small scattered lymph nodes are noted. Reproductive: The uterus and ovaries are unremarkable. Other: No pelvic mass or adenopathy. No free pelvic fluid collections. No inguinal mass or adenopathy. No abdominal wall hernia or subcutaneous lesions. Musculoskeletal: No significant bony findings. IMPRESSION: 1. No acute abdominal/pelvic findings, mass lesions or adenopathy. 2. No renal, ureteral or bladder calculi or mass. Electronically Signed   By: PMarijo SanesM.D.   On:  10/12/2019 17:23    Chart has been reviewed    Assessment/Plan  24y.o. female with medical history significant of endometriosis, asthma    Admitted for a.fib w RVR  Present on Admission: . Atrial fibrillation with RVR (HCC) -  - Admit to step down on Cardizem drip initially then was discontinued convert to metoprolol titrate 26 mg every 6 hours       CHA2D-VASC score 1       Initially started on Heparin, per cardiology converted to Eliquis  Unclear etiology      Check TSH      Cycle cardiac enzymes      Obtain ECHO      Cardiology consulted  Plan for possible cardioversion in a.m. We will hydrate   . Mild persistent asthma, uncomplicated -chronic stable continue butyryl  . Contrast media adverse reaction -patient received a dose of Solu-Medrol and Benadryl in emergency department currently improved.  Continue to monitor   . Dehydration -we will rehydrate   . Metabolic acidosis -in the setting of dehydration.  Check lactic acid.  Rehydrate and follow.  Repeat be met  . Hyperkalemia -repeat the met after aggressive fluid resuscitation  History of endometriosis -will need to follow-up with OB/GYN Other plan as per orders.  DVT prophylaxis:   Eliquis  Code Status:  FULL CODE as per patient     I had personally discussed CODE STATUS with patient    Family Communication:   Family   at  Bedside  plan of care was discussed   With  mother  Disposition Plan:    To home once workup is complete and patient is stable   Following barriers for discharge:                            Electrolytes corrected                             Pain controlled with PO medications                                                           Will need to be able to tolerate PO                                                      Will need consultants to evaluate patient prior to discharge                                        Consults called   Cardiology  Admission status:  ED  Disposition    ED Disposition Condition [place]: [place] [[id number]]  Level of Care: Progressive [102]  Admit to Progressive based on following criteria: CARDIOVASCULAR & THORACIC of moderate stability with acute coronary syndrome symptoms/low risk myocardial infarction/hypertensive urgency/arrhythmias/heart failure potentially compromising stability and stable post cardiovascular intervention patients.  Covid Evaluation: Confirmed COVID Negative  Diagnosis: Atrial fibrillation with RVR [facility]) [[id number]]  Admitting Physician: Toy Baker [[id number]]  Attending Physician: Toy Baker [[id number]]       Obs       Level of care        SDU tele indefinitely please discontinue once patient no longer qualifies   Precautions: admitted as   Covid Negative    PPE: Used by the provider:  N95 eye Goggles,  Gloves      Janete Quilling 10/12/2019, 11:09 PM    [facility]  after 2 AM please page floor coverage PA If 7AM-7PM, please contact the day team taking care of the patient using [email or web address]   Patient was evaluated in the context of the global COVID-19 pandemic, which necessitated consideration that the patient might be at risk for infection with the SARS-CoV-2 virus that causes COVID-19. Institutional protocols and algorithms that pertain to the evaluation of patients at risk for COVID-19 are in a state of rapid change based on information released by regulatory bodies including the CDC and federal and state organizations. These policies and algorithms were followed during the patient's care.

## 2019-10-12 NOTE — ED Notes (Signed)
Pt to CT

## 2019-10-12 NOTE — ED Provider Notes (Signed)
I assumed care of patient at shift change from [facility]. Alroy Bailiff PA-C please see her note for full H and P.  Briefly patient is here for evaluation of abdominal pain.  She missed her depo shot last month and has endometriosis.   She was found to be in new a-fib today.  Physical Exam  BP 107/71   Pulse (!) 103   Temp 97.7 F (36.5 C) (Oral)   Resp 18   Ht 5\' 4"  (1.626 m)   Wt 72.6 kg   SpO2 99%   BMI 27.46 kg/m   Physical Exam Vitals and nursing note reviewed.  Constitutional:      General: She is not in acute distress.    Appearance: She is well-developed. She is not diaphoretic.  HENT:     Head: Normocephalic and atraumatic.  Eyes:     General: No scleral icterus.       Right eye: No discharge.        Left eye: No discharge.     Conjunctiva/sclera: Conjunctivae normal.  Cardiovascular:     Rate and Rhythm: Normal rate. Rhythm irregular.     Pulses: Normal pulses.  Pulmonary:     Effort: Pulmonary effort is normal. No respiratory distress.     Breath sounds: No stridor.  Abdominal:     General: There is no distension.  Musculoskeletal:        General: No deformity.     Cervical back: Normal range of motion.  Skin:    General: Skin is warm and dry.  Neurological:     Mental Status: She is alert.     Motor: No abnormal muscle tone.  Psychiatric:        Behavior: Behavior normal.     ED Course/Procedures   Clinical Course as of Oct 11 1845  Thu Oct 12, 2019  1215 I-stat hCG, quantitative: <5.0 [MV]  1620 Patient returns from CT scan, Hives, itching.    [EH]  1627 Patient reports itching improving.     [EH]  1657 Patient is continuing to improve.     [EH]  1835 Patient has been eating outside food.  She feels allergic reaction has resolved.    [EH]    Clinical Course User Index [EH] Lorin Glass, PA-C [MV] Eustaquio Maize, PA-C    .Critical Care Performed by: Lorin Glass, PA-C Authorized by: Lorin Glass, PA-C   Critical care  provider statement:    Critical care time (minutes):  45   Critical care time was exclusive of:  Separately billable procedures and treating other patients and teaching time   Critical care was time spent personally by me on the following activities:  Discussions with consultants, evaluation of patient's response to treatment, examination of patient, ordering and performing treatments and interventions, ordering and review of laboratory studies, ordering and review of radiographic studies, pulse oximetry, re-evaluation of patient's condition, obtaining history from patient or surrogate and review of old charts Comments:     New onset a-fib requiring admission, heparin     CT Abdomen Pelvis W Contrast  Result Date: 10/12/2019 CLINICAL DATA:  Lower abdominal pain since yesterday. History of endometriosis. EXAM: CT ABDOMEN AND PELVIS WITH CONTRAST TECHNIQUE: Multidetector CT imaging of the abdomen and pelvis was performed using the standard protocol following bolus administration of intravenous contrast. CONTRAST:  171mL OMNIPAQUE IOHEXOL 300 MG/ML  SOLN COMPARISON:  None. FINDINGS: Lower chest: The lung bases are clear of acute process. No pleural effusion or pulmonary  lesions. The heart is normal in size. No pericardial effusion. The distal esophagus and aorta are unremarkable. Hepatobiliary: No focal hepatic lesions or intrahepatic biliary dilatation. The gallbladder is normal. No common bile duct dilatation. Pancreas: No mass, inflammation or ductal dilatation. Spleen: Normal size.  No focal lesions. Adrenals/Urinary Tract: The adrenal glands and kidneys are unremarkable. No renal, ureteral or bladder calculi or mass. No CT findings suspicious for pyelonephritis. Stomach/Bowel: The stomach, duodenum, small bowel and colon are grossly normal without oral contrast. No acute inflammatory changes, mass lesions or obstructive findings. The terminal ileum is normal. The appendix is normal. Vascular/Lymphatic:  The aorta is normal in caliber. No dissection. The branch vessels are patent. The major venous structures are patent. No mesenteric or retroperitoneal mass or adenopathy. Small scattered lymph nodes are noted. Reproductive: The uterus and ovaries are unremarkable. Other: No pelvic mass or adenopathy. No free pelvic fluid collections. No inguinal mass or adenopathy. No abdominal wall hernia or subcutaneous lesions. Musculoskeletal: No significant bony findings. IMPRESSION: 1. No acute abdominal/pelvic findings, mass lesions or adenopathy. 2. No renal, ureteral or bladder calculi or mass. Electronically Signed   By: Rudie Meyer M.D.   On: 10/12/2019 17:23   Medications  sodium chloride 0.9 % bolus 1,000 mL (0 mLs Intravenous Stopped 10/12/19 1326)  ondansetron (ZOFRAN) injection 4 mg (4 mg Intravenous Given 10/12/19 1201)  medroxyPROGESTERone (DEPO-PROVERA) injection 150 mg (150 mg Intramuscular Given 10/12/19 1318)  diltiazem (CARDIZEM) injection 10 mg (10 mg Intravenous Given 10/12/19 1231)  HYDROmorphone (DILAUDID) injection 1 mg (1 mg Intravenous Given 10/12/19 1230)  diltiazem (CARDIZEM) injection 20 mg (20 mg Intravenous Given 10/12/19 1316)  sodium chloride 0.9 % bolus 1,000 mL (0 mLs Intravenous Stopped 10/12/19 1725)  ketorolac (TORADOL) 30 MG/ML injection 30 mg (30 mg Intravenous Given 10/12/19 1435)  iohexol (OMNIPAQUE) 300 MG/ML solution 100 mL (100 mLs Intravenous Contrast Given 10/12/19 1600)  diphenhydrAMINE (BENADRYL) injection 50 mg (50 mg Intravenous Given 10/12/19 1621)  methylPREDNISolone sodium succinate (SOLU-MEDROL) 125 mg/2 mL injection 125 mg (125 mg Intravenous Given 10/12/19 1621)  famotidine (PEPCID) IVPB 20 mg premix (0 mg Intravenous Stopped 10/12/19 1647)  heparin bolus via infusion 3,800 Units (3,800 Units Intravenous Bolus from Bag 10/12/19 2001)  sodium chloride 0.9 % bolus 1,000 mL (1,000 mLs Intravenous New Bag/Given 10/12/19 2130)  sodium chloride 0.9 % bolus 1,000 mL (1,000  mLs Intravenous New Bag/Given 10/12/19 2247)     MDM    } CHA2DS2VASc score 1.  Plan to follow up on CT scan, May need admission for new a-fib.   When patient returned from CT scan she was found to be having an allergic reaction, hives, itching and skin redness with no SHOB.  This was treated with IV Benadryl, steroids, Pepcid and rapidly resolved.   CT scan with out acute abnormalities.   Cardiology have been consulted.  I initially spoke with them while patient was having allergic reaction, plan to call back once this has resolved.  After allergic reaction was treated and resolved I spoke with Dr. Bufford Buttner of cardiology who recommended heparinization, admission.  Dr. Adela Glimpse saw the patient for admission.  Note: Portions of this report may have been transcribed using voice recognition software. Every effort was made to ensure accuracy; however, inadvertent computerized transcription errors may be present         Joyce Franco 10/12/19 2257    Eber Hong, MD 10/13/19 [phone]

## 2019-10-12 NOTE — ED Triage Notes (Signed)
Patient complains of severe abdominal pain related to her endometriosis. Patient states chronic pain related to same

## 2019-10-12 NOTE — ED Provider Notes (Signed)
This patient has some chronic abdominal pain secondary to history of reported endometriosis, presents with nausea vomiting abdominal pain but on exam is also very tachycardic with heart rates ranging between 150 190 in rapid A. fib.  At this time the patient is in this atrial fibrillation, she is getting IV fluids, antiemetics, she will need a small amount of Cardizem but I suspect the A. fib is new and secondary to the other processes.  She does not take any other daily medications and is low risk on the CHA2DS2-VASc score.  Patient requiring continuous Cardizem drip, continues to be tachycardic and needs admission to the hospital, I agree with critical care services as provided  Medical screening examination/treatment/procedure(s) were conducted as a shared visit with non-physician practitioner(s) and myself.  I personally evaluated the patient during the encounter.  Clinical Impression:   Final diagnoses:  Atrial fibrillation with rapid ventricular response (HCC)  Generalized abdominal pain  Endometriosis         Eber Hong, MD 10/13/19 [phone]

## 2019-10-12 NOTE — ED Notes (Signed)
Per pharmacist, Heparin running until Eliqus arrives from pharmacy.

## 2019-10-12 NOTE — ED Provider Notes (Signed)
[facility] EMERGENCY DEPARTMENT Provider Note   CSN: [id number] Arrival date & time: 10/12/19  1110     History No chief complaint on file.   Joyce Franco is a 24 y.o. female with PMHx endometriosis who presents to the ED with complaint of gradual onset, constant, sharp, worsening, lower abdominal pain that began yesterday. Pt also complains of nausea and NBNB emesis (too numerous to count episodes).  Patient reports that she typically receives Depo-Provera injections to prevent endometrial type pain however she missed her dose last month due to family issues.  She states she thinks this is why she is having pain today.  She states that he has been unable to keep anything down at home including pain medicine.  Reports she began having vaginal bleeding this morning consistent with her normal menses, denies excessive heavy bleeding.  She denies fevers, chills, urinary symptoms, diarrhea, chest pain, shortness of breath, any other additional symptoms.   The history is provided by the patient and medical records.       Past Medical History:  Diagnosis Date  . Asthma   . Endometriosis, uterus   . Fractured pelvis (HCC)   . Medical history non-contributory   . Urticaria     Patient Active Problem List   Diagnosis Date Noted  . Mild persistent asthma, uncomplicated 05/25/2019  . Seasonal and perennial allergic rhinitis 05/25/2019  . Anaphylactic shock due to adverse food reaction 05/25/2019    Past Surgical History:  Procedure Laterality Date  . ABLATION ON ENDOMETRIOSIS  11/08/2014   Procedure: ABLATION ON ENDOMETRIOSIS;  Surgeon: Geryl Rankins, MD;  Location: [facility];  Service: Gynecology;;  . [name]  11/08/2014   Procedure: CHROMOPERTUBATION;  Surgeon: Geryl Rankins, MD;  Location: [facility];  Service: Gynecology;;  . FOOT SURGERY    . LAPAROSCOPY N/A 11/08/2014   Procedure: LAPAROSCOPY DIAGNOSTIC with peritoneal  biopsy;  Surgeon: Geryl Rankins, MD;   Location: [facility];  Service: Gynecology;  Laterality: N/A;     OB History    Gravida  0   Para  0   Term  0   Preterm  0   AB  0   Living  0     SAB  0   TAB  0   Ectopic  0   Multiple  0   Live Births              Family History  Problem Relation Age of Onset  . Healthy Mother   . Healthy Father     Social History   Tobacco Use  . Smoking status: Passive Smoke Exposure - Never Smoker  . Smokeless tobacco: Never Used  . Tobacco comment: boyfriend smokes inside his home  Vaping Use  . Vaping Use: Never used  Substance Use Topics  . Alcohol use: No  . Drug use: No    Home Medications Prior to Admission medications   Medication Sig Start Date End Date Taking? Authorizing Provider  albuterol (VENTOLIN HFA) 108 (90 Base) MCG/ACT inhaler Inhale 2 puffs into the lungs every 4 (four) hours as needed. 05/25/19  Yes Alfonse Spruce, MD  fluticasone furoate-vilanterol (BREO ELLIPTA) 100-25 MCG/INH AEPB Inhale 1 puff into the lungs daily. 05/25/19  Yes Alfonse Spruce, MD  medroxyPROGESTERone (DEPO-PROVERA) 150 MG/ML injection Inject 1 mL (150 mg total) into the muscle once. Patient taking differently: Inject 150 mg into the muscle every 3 (three) months.  06/26/14  Yes Geryl Rankins, MD  Prenatal Vit-Fe Fumarate-FA (PRENATAL PO) Take 1 tablet by mouth daily.   Yes [provider]  chlorhexidine (PERIDEX) 0.12 % solution Use as directed 15 mLs in the mouth or throat 2 (two) times daily. Patient not taking: Reported on 10/12/2019 05/19/15   Elvina Sidle, MD  levocetirizine (XYZAL) 5 MG tablet Take 1 tablet (5 mg total) by mouth every evening. Patient not taking: Reported on 10/12/2019 05/25/19   Alfonse Spruce, MD  ondansetron (ZOFRAN) 4 MG tablet Take 1 tablet (4 mg total) by mouth every 6 (six) hours as needed for nausea or vomiting. Patient not taking: Reported on 05/25/2019 04/10/18   [name], NP    Allergies     Ibuprofen  Review of Systems   Review of Systems  Constitutional: Negative for chills and fever.  Gastrointestinal: Positive for abdominal pain, nausea and vomiting. Negative for constipation and diarrhea.  Genitourinary: Negative for difficulty urinating, flank pain and frequency.  All other systems reviewed and are negative.   Physical Exam Updated Vital Signs BP (!) 130/91   Pulse 98   Temp 97.7 F (36.5 C) (Oral)   Resp 20 Comment: Vomiting  Ht 5\' 4"  (1.626 m)   Wt 72.6 kg   SpO2 100%   BMI 27.46 kg/m   Physical Exam Vitals and nursing note reviewed.  Constitutional:      Comments: Uncomfortable appearing female, clutching stomach s/2 pain  HENT:     Head: Normocephalic and atraumatic.  Eyes:     Conjunctiva/sclera: Conjunctivae normal.  Cardiovascular:     Rate and Rhythm: Tachycardia present. Rhythm irregularly irregular.     Pulses: Normal pulses.  Pulmonary:     Effort: Pulmonary effort is normal.     Breath sounds: Normal breath sounds. No wheezing, rhonchi or rales.  Abdominal:     Palpations: Abdomen is soft.     Tenderness: There is abdominal tenderness. There is no guarding or rebound.  Musculoskeletal:     Cervical back: Neck supple.  Skin:    General: Skin is warm and dry.  Neurological:     Mental Status: She is alert.     ED Results / Procedures / Treatments   Labs (all labs ordered are listed, but only abnormal results are displayed) Labs Reviewed  COMPREHENSIVE METABOLIC PANEL - Abnormal; Notable for the following components:      Result Value   Potassium 5.2 (*)    CO2 18 (*)    Glucose, Bld 129 (*)    Total Bilirubin 1.6 (*)    Anion gap 18 (*)    All other components within normal limits  CBC WITH DIFFERENTIAL/PLATELET - Abnormal; Notable for the following components:   RBC 5.38 (*)    Hemoglobin 15.3 (*)    All other components within normal limits  SARS CORONAVIRUS 2 BY RT PCR (HOSPITAL ORDER, PERFORMED IN [facility]  LAB)  LIPASE, BLOOD  URINALYSIS, ROUTINE W REFLEX MICROSCOPIC  RAPID URINE DRUG SCREEN, HOSP PERFORMED  I-STAT BETA HCG BLOOD, ED ([facility], [facility], [facility] ONLY)    EKG EKG Interpretation  Date/Time:  Thursday October 12 2019 13:48:30 EDT Ventricular Rate:  88 PR Interval:    QRS Duration: 80 QT Interval:  370 QTC Calculation: 448 R Axis:   79 Text Interpretation: Atrial fibrillation ST elevation suggests acute pericarditis Since last tracing rate slower still in afib Confirmed by 05-03-1992 (Eber Hong) on 10/12/2019 1:56:29 PM   Radiology No results found.  Procedures Procedures (including critical  care time)  Medications Ordered in ED Medications  sodium chloride 0.9 % bolus 1,000 mL (0 mLs Intravenous Stopped 10/12/19 1326)  ondansetron (ZOFRAN) injection 4 mg (4 mg Intravenous Given 10/12/19 1201)  medroxyPROGESTERone (DEPO-PROVERA) injection 150 mg (150 mg Intramuscular Given 10/12/19 1318)  diltiazem (CARDIZEM) injection 10 mg (10 mg Intravenous Given 10/12/19 1231)  HYDROmorphone (DILAUDID) injection 1 mg (1 mg Intravenous Given 10/12/19 1230)  diltiazem (CARDIZEM) injection 20 mg (20 mg Intravenous Given 10/12/19 1316)  sodium chloride 0.9 % bolus 1,000 mL (1,000 mLs Intravenous New Bag/Given 10/12/19 1410)  ketorolac (TORADOL) 30 MG/ML injection 30 mg (30 mg Intravenous Given 10/12/19 1435)    ED Course  I have reviewed the triage vital signs and the nursing notes.  Pertinent labs & imaging results that were available during my care of the patient were reviewed by me and considered in my medical decision making (see chart for details).  Clinical Course as of Oct 11 1532  Thu Oct 12, 2019  1215 I-stat hCG, quantitative: <5.0 [MV]    Clinical Course User Index [MV] Eustaquio Maize, PA-C   MDM Rules/Calculators/A&P     CHA2DS2-VASc Score: 1                     24 year old female who presents to the ED today complaining of diffuse abdominal pain, nausea, vomiting with history of  endometriosis.  States this feels similar.  Missed Depo-Provera injection last month and thinks this has contributed.  Has been having some vaginal bleeding that started today.  On arrival to the ED patient is afebrile, nontachycardic nontachypneic.  She does appear mildly uncomfortable and is clutching stomach secondary to pain.  Will obtain labs, will provide pain medication after pregnancy test obtained.  Will provide Depo injection here given patient has been currently on and there are no contraindications.  While in the ED pt's HR increased into the 150s. EKG obtained - A fib with RVR. Pt without any hx of same. No new meds. Dr. Sabra Heck evaluated patient as well; will start on cardizem. Low CHADSVASC score.   HR continued to be in the 120-130 range after 10 mg Cardizem bolus. Additional 20 mg bolus given; HR has decreased into the 90s however pt continues to be in A fib. Difficult to say exactly when pt went into A fib and therefore do not feel she is a good candidate for cardioversion. May require admission - will obtain CT A/P prior to admission. Additional fluid bolus given.   3:42 PM At shift change case signed out to Wyn Quaker, [place], who will admit patient after CT scan.    Final Clinical Impression(s) / ED Diagnoses Final diagnoses:  Atrial fibrillation with rapid ventricular response ([facility])  Generalized abdominal pain  Endometriosis    Rx / DC Orders ED Discharge Orders    None       Eustaquio Maize, PA-C 10/12/19 1543    Noemi Chapel, MD 10/13/19 [phone]

## 2019-10-12 NOTE — ED Notes (Signed)
RN called main lab in regards to TSH lab. Lab stated they will add it on.

## 2019-10-12 NOTE — Consult Note (Addendum)
Cardiology Consultation:   Patient ID: Joyce Franco MRN: [id number]; DOB: [birth year]  Admit date: 10/12/2019 Date of Consult: 10/12/2019  Primary Care Provider: [name], MD [facility] Cardiologist: [facility] Electrophysiologist:  None    Patient Profile:   Joyce Franco is a 24 y.o. female with a hx of endometriosis, asthma, allergies, food intolerance who is being seen today for the evaluation of new onset Afib at the request of Dr. Hyacinth Meeker .  History of Present Illness:   Ms. Slaven has no prior cardiac history. She presented to the ED 10/12/19 for abdominal pain that started the day before. She has a history of endometriosis. The pain was gradual onset and then became sharp and has since been worsening. Also had vaginal bleeding this morning consistent with normal menses. Also had nausea and multiple episodes of vomiting. She reportedly missed a Depo shot last month. Denies fever, chills, diarrhea, sob. No chest pain.  In the ED she was started on IVF and given antiemetics. Her heart rate was noted to be high and was found to be in Afib RVR rates up to 180. EKG confirmed Afib with rate of 178 bpm. She was given cardizem 10 mg x 1 and then 20 mg x 1.  Vitals show BP 107/71, afebrile, RR 18, 99% O2. Labs show potassium 5.2, sodium 140, glucose 129, Bicarb 18, anion gap 18, creatinine 0.97, BUN 11, total bili 1.6, lipase wnl,  WBC 8.0, Hgb 15.3. negative hCG. COVID negative. UDS with Benzodiazepines and THC. UTI with glucose and protein, no UTI. CT abd/pelvis showed no acute abnormality.  At the time my examination, she was in no acute distress.  She was in atrial fibrillation with heart rate in the 100-120 range.  There is no wide QRS complex to suggest an accessory pathway.  She reports no cardiac troubles as a child.  UDS positive for benzos and THC.   Past Medical History:  Diagnosis Date  . Asthma   . Endometriosis, uterus   . Fractured pelvis (HCC)   . Medical  history non-contributory   . Urticaria     Past Surgical History:  Procedure Laterality Date  . ABLATION ON ENDOMETRIOSIS  11/08/2014   Procedure: ABLATION ON ENDOMETRIOSIS;  Surgeon: Geryl Rankins, MD;  Location: [facility];  Service: Gynecology;;  . [name]  11/08/2014   Procedure: CHROMOPERTUBATION;  Surgeon: Geryl Rankins, MD;  Location: [facility];  Service: Gynecology;;  . FOOT SURGERY    . LAPAROSCOPY N/A 11/08/2014   Procedure: LAPAROSCOPY DIAGNOSTIC with peritoneal  biopsy;  Surgeon: Geryl Rankins, MD;  Location: [facility];  Service: Gynecology;  Laterality: N/A;     Home Medications:  Prior to Admission medications   Medication Sig Start Date End Date Taking? Authorizing Provider  albuterol (VENTOLIN HFA) 108 (90 Base) MCG/ACT inhaler Inhale 2 puffs into the lungs every 4 (four) hours as needed. 05/25/19  Yes Alfonse Spruce, MD  fluticasone furoate-vilanterol (BREO ELLIPTA) 100-25 MCG/INH AEPB Inhale 1 puff into the lungs daily. 05/25/19  Yes Alfonse Spruce, MD  medroxyPROGESTERone (DEPO-PROVERA) 150 MG/ML injection Inject 1 mL (150 mg total) into the muscle once. Patient taking differently: Inject 150 mg into the muscle every 3 (three) months.  06/26/14  Yes Geryl Rankins, MD  Prenatal Vit-Fe Fumarate-FA (PRENATAL PO) Take 1 tablet by mouth daily.   Yes [provider]  chlorhexidine (PERIDEX) 0.12 % solution Use as directed 15 mLs in the mouth or throat 2 (two) times daily. Patient  not taking: Reported on 10/12/2019 05/19/15   Robyn Haber, MD  levocetirizine (XYZAL) 5 MG tablet Take 1 tablet (5 mg total) by mouth every evening. Patient not taking: Reported on 10/12/2019 05/25/19   Valentina Shaggy, MD  ondansetron (ZOFRAN) 4 MG tablet Take 1 tablet (4 mg total) by mouth every 6 (six) hours as needed for nausea or vomiting. Patient not taking: Reported on 05/25/2019 04/10/18   Ashley Murrain, NP    Inpatient Medications: Scheduled Meds:  Continuous  Infusions:  PRN Meds:   Allergies:    Allergies  Allergen Reactions  . Contrast Media [Iodinated Diagnostic Agents] Hives and Other (See Comments)    Warm feeling in back of throat 10 minutes after contrast  . Ibuprofen Nausea And Vomiting    Social History:   Social History   Socioeconomic History  . Marital status: Single    Spouse name: Not on file  . Number of children: Not on file  . Years of education: Not on file  . Highest education level: Not on file  Occupational History  . Not on file  Tobacco Use  . Smoking status: Passive Smoke Exposure - Never Smoker  . Smokeless tobacco: Never Used  . Tobacco comment: boyfriend smokes inside his home  Vaping Use  . Vaping Use: Never used  Substance and Sexual Activity  . Alcohol use: No  . Drug use: No  . Sexual activity: Yes  Other Topics Concern  . Not on file  Social History Narrative  . Not on file   Social Determinants of Health   Financial Resource Strain:   . Difficulty of Paying Living Expenses:   Food Insecurity:   . Worried About Charity fundraiser in the Last Year:   . Arboriculturist in the Last Year:   Transportation Needs:   . Film/video editor (Medical):   Marland Kitchen Lack of Transportation (Non-Medical):   Physical Activity:   . Days of Exercise per Week:   . Minutes of Exercise per Session:   Stress:   . Feeling of Stress :   Social Connections:   . Frequency of Communication with Friends and Family:   . Frequency of Social Gatherings with Friends and Family:   . Attends Religious Services:   . Active Member of Clubs or Organizations:   . Attends Archivist Meetings:   Marland Kitchen Marital Status:   Intimate Partner Violence:   . Fear of Current or Ex-Partner:   . Emotionally Abused:   Marland Kitchen Physically Abused:   . Sexually Abused:     Family History:   Family History  Problem Relation Age of Onset  . Healthy Mother   . Healthy Father      ROS:  Please see the history of present illness.   All other ROS reviewed and negative.     Physical Exam/Data:   Vitals:   10/12/19 1759 10/12/19 1800 10/12/19 1900 10/12/19 1915  BP: (!) 115/101 119/89 118/89   Pulse: 100 (!) 113 (!) 110 98  Resp: (!) 31 17 16 17   Temp:      TempSrc:      SpO2: 100% 99% 98% 98%  Weight:      Height:        Intake/Output Summary (Last 24 hours) at 10/12/2019 1941 Last data filed at 10/12/2019 1725 Gross per 24 hour  Intake 2050 ml  Output --  Net 2050 ml   Last 3 Weights 10/12/2019 05/25/2019 04/10/2018  Weight (lbs) 160 lb 144 lb 12.8 oz 163 lb 8 oz  Weight (kg) 72.576 kg 65.681 kg 74.163 kg     Body mass index is 27.46 kg/m.  General:  Well nourished, well developed, in no acute distress HEENT: normal Lymph: no adenopathy Neck: no JVD Endocrine:  No thryomegaly Vascular: No carotid bruits; FA pulses 2+ bilaterally without bruits  Cardiac:  normal S1, irregular rhythm, no murmurs rubs or gallops Lungs:  clear to auscultation bilaterally, no wheezing, rhonchi or rales  Abd: soft, nontender, no hepatomegaly  Ext: no edema Musculoskeletal:  No deformities, BUE and BLE strength normal and equal Skin: warm and dry  Neuro:  CNs 2-12 intact, no focal abnormalities noted Psych:  Normal affect   EKG:  The EKG was personally reviewed and demonstrates:  Afib HR 178 bpm Telemetry:  Telemetry was personally reviewed and demonstrates: Atrial fibrillation with heart rates in the 100-1 20 range  Relevant CV Studies:  Order Echo  Laboratory Data:  High Sensitivity Troponin:  No results for input(s): TROPONINIHS in the last 720 hours.   Chemistry Recent Labs  Lab 10/12/19 1155  NA 140  K 5.2*  CL 104  CO2 18*  GLUCOSE 129*  BUN 11  CREATININE 0.97  CALCIUM 9.6  GFRNONAA >60  GFRAA >60  ANIONGAP 18*    Recent Labs  Lab 10/12/19 1155  PROT 7.5  ALBUMIN 4.4  AST 32  ALT 21  ALKPHOS 47  BILITOT 1.6*   Hematology Recent Labs  Lab 10/12/19 1155  WBC 8.0  RBC 5.38*  HGB  15.3*  HCT 44.9  MCV 83.5  MCH 28.4  MCHC 34.1  RDW 13.5  PLT 307   BNPNo results for input(s): BNP, PROBNP in the last 168 hours.  DDimer No results for input(s): DDIMER in the last 168 hours.   Radiology/Studies:  CT Abdomen Pelvis W Contrast  Result Date: 10/12/2019 CLINICAL DATA:  Lower abdominal pain since yesterday. History of endometriosis. EXAM: CT ABDOMEN AND PELVIS WITH CONTRAST TECHNIQUE: Multidetector CT imaging of the abdomen and pelvis was performed using the standard protocol following bolus administration of intravenous contrast. CONTRAST:  OMNIPAQUE IOHEXOL 300 MG/ML  SOLN COMPARISON:  None. FINDINGS: Lower chest: The lung bases are clear of acute process. No pleural effusion or pulmonary lesions. The heart is normal in size. No pericardial effusion. The distal esophagus and aorta are unremarkable. Hepatobiliary: No focal hepatic lesions or intrahepatic biliary dilatation. The gallbladder is normal. No common bile duct dilatation. Pancreas: No mass, inflammation or ductal dilatation. Spleen: Normal size.  No focal lesions. Adrenals/Urinary Tract: The adrenal glands and kidneys are unremarkable. No renal, ureteral or bladder calculi or mass. No CT findings suspicious for pyelonephritis. Stomach/Bowel: The stomach, duodenum, small bowel and colon are grossly normal without oral contrast. No acute inflammatory changes, mass lesions or obstructive findings. The terminal ileum is normal. The appendix is normal. Vascular/Lymphatic: The aorta is normal in caliber. No dissection. The branch vessels are patent. The major venous structures are patent. No mesenteric or retroperitoneal mass or adenopathy. Small scattered lymph nodes are noted. Reproductive: The uterus and ovaries are unremarkable. Other: No pelvic mass or adenopathy. No free pelvic fluid collections. No inguinal mass or adenopathy. No abdominal wall hernia or subcutaneous lesions. Musculoskeletal: No significant bony  findings. IMPRESSION: 1. No acute abdominal/pelvic findings, mass lesions or adenopathy. 2. No renal, ureteral or bladder calculi or mass. Electronically Signed   By: Rudie Meyer M.D.   On:  10/12/2019 17:23   {  Assessment and Plan:   New onset Afib -Unclear etiology.  Could be drug related.  She reports no cocaine use.  Her QRS is narrow and I do not suspect underlying accessory pathway.  If she had Wolff-Parkinson-White I would expect a wide-complex irregular rhythm.  She has no murmurs on exam to suggest underlying structural heart disease.  Her mother is with her and she reports no prior cardiac history as a child or any real medical conditions oth source of infection.  She has no clinical evidence of heart failure examination.  Her EKG shows er than endometriosis.  Really unclear etiology. This could just be related nausea and vomiting.  I see no obviousno ischemic changes.   She will need to be admitted to the hospital medicine service to work-up her anion gap acidosis.  -We will tentatively plan for TEE cardioversion tomorrow.  We will give a loading dose of Eliquis 10 mg tonight.  We will then resume 5 mg twice daily tomorrow.  Her chads vas score is 1.  She is extremely low risk I do not know the timing of her atrial fibrillation.  We will plan for at least 1 month of therapy and then work-up in the hospital. -Tonight we will rate control with metoprolol tartrate 25 mg every 6.  We can titrate up for better rate control.  Blood pressures are soft and I see no need for diltiazem drip at this time.  Should she have worsening rates we would need to pursue diltiazem drip. -Echocardiogram will be important. -Thyroid studies are pending. -Pregnancy test is negative. -Would recommend to hydrate and correct metabolic derangements.  Hyperglycemia  - acidotic with bicarb of 18 and anion gap of 18 - UA with glucose and protein - Either stress related or diabetes.  Glucose is only 129, could be  distress related.  Per IM - abdominal pain, N/V  For questions or updates, please contact [facility] Please consult [email or web address] for contact info under    [employer]. Flora Lipps, MD [facility]  [address] [place], [place] [address] [phone]  8:40 PM

## 2019-10-13 ENCOUNTER — Observation Stay (HOSPITAL_COMMUNITY): Payer: Federal, State, Local not specified - PPO | Admitting: Certified Registered Nurse Anesthetist

## 2019-10-13 ENCOUNTER — Encounter (HOSPITAL_COMMUNITY)
Disposition: A | Discharge status: Home / Self Care | Payer: Federal, State, Local not specified - PPO | Source: Home / Self Care | Attending: Internal Medicine

## 2019-10-13 ENCOUNTER — Encounter (HOSPITAL_COMMUNITY): Payer: Self-pay | Admitting: Internal Medicine

## 2019-10-13 ENCOUNTER — Observation Stay (HOSPITAL_COMMUNITY): Payer: Federal, State, Local not specified - PPO

## 2019-10-13 DIAGNOSIS — J4520 Mild intermittent asthma, uncomplicated: Secondary | ICD-10-CM | POA: Diagnosis not present

## 2019-10-13 DIAGNOSIS — I4891 Unspecified atrial fibrillation: Secondary | ICD-10-CM | POA: Diagnosis not present

## 2019-10-13 DIAGNOSIS — I480 Paroxysmal atrial fibrillation: Secondary | ICD-10-CM

## 2019-10-13 DIAGNOSIS — Z79899 Other long term (current) drug therapy: Secondary | ICD-10-CM | POA: Diagnosis not present

## 2019-10-13 DIAGNOSIS — Z23 Encounter for immunization: Secondary | ICD-10-CM | POA: Diagnosis not present

## 2019-10-13 DIAGNOSIS — Z20822 Contact with and (suspected) exposure to covid-19: Secondary | ICD-10-CM | POA: Diagnosis not present

## 2019-10-13 DIAGNOSIS — E875 Hyperkalemia: Secondary | ICD-10-CM | POA: Diagnosis not present

## 2019-10-13 DIAGNOSIS — R1084 Generalized abdominal pain: Secondary | ICD-10-CM | POA: Diagnosis present

## 2019-10-13 DIAGNOSIS — E860 Dehydration: Secondary | ICD-10-CM | POA: Diagnosis not present

## 2019-10-13 DIAGNOSIS — J4530 Mild persistent asthma, uncomplicated: Secondary | ICD-10-CM | POA: Diagnosis not present

## 2019-10-13 DIAGNOSIS — I4819 Other persistent atrial fibrillation: Secondary | ICD-10-CM | POA: Diagnosis not present

## 2019-10-13 DIAGNOSIS — E872 Acidosis: Secondary | ICD-10-CM | POA: Diagnosis not present

## 2019-10-13 DIAGNOSIS — R739 Hyperglycemia, unspecified: Secondary | ICD-10-CM | POA: Diagnosis present

## 2019-10-13 HISTORY — PX: TEE WITHOUT CARDIOVERSION: SHX5443

## 2019-10-13 HISTORY — PX: CARDIOVERSION: SHX1299

## 2019-10-13 LAB — HIV ANTIBODY (ROUTINE TESTING W REFLEX): HIV Screen 4th Generation wRfx: NONREACTIVE

## 2019-10-13 LAB — CBC WITH DIFFERENTIAL/PLATELET
Abs Immature Granulocytes: 0.01 10*3/uL (ref 0.00–0.07)
Basophils Absolute: 0 10*3/uL (ref 0.0–0.1)
Basophils Relative: 0 %
Eosinophils Absolute: 0 10*3/uL (ref 0.0–0.5)
Eosinophils Relative: 0 %
HCT: 39.2 % (ref 36.0–46.0)
Hemoglobin: 13.7 g/dL (ref 12.0–15.0)
Immature Granulocytes: 0 %
Lymphocytes Relative: 11 %
Lymphs Abs: 0.9 10*3/uL (ref 0.7–4.0)
MCH: 28.4 pg (ref 26.0–34.0)
MCHC: 34.9 g/dL (ref 30.0–36.0)
MCV: 81.2 fL (ref 80.0–100.0)
Monocytes Absolute: 0.1 10*3/uL (ref 0.1–1.0)
Monocytes Relative: 1 %
Neutro Abs: 7.4 10*3/uL (ref 1.7–7.7)
Neutrophils Relative %: 88 %
Platelets: 274 10*3/uL (ref 150–400)
RBC: 4.83 MIL/uL (ref 3.87–5.11)
RDW: 13.2 % (ref 11.5–15.5)
WBC: 8.4 10*3/uL (ref 4.0–10.5)
nRBC: 0 % (ref 0.0–0.2)

## 2019-10-13 LAB — LACTIC ACID, PLASMA: Lactic Acid, Venous: 1.1 mmol/L (ref 0.5–1.9)

## 2019-10-13 LAB — BASIC METABOLIC PANEL
Anion gap: 9 (ref 5–15)
BUN: 7 mg/dL (ref 6–20)
CO2: 21 mmol/L — ABNORMAL LOW (ref 22–32)
Calcium: 8.2 mg/dL — ABNORMAL LOW (ref 8.9–10.3)
Chloride: 110 mmol/L (ref 98–111)
Creatinine, Ser: 0.75 mg/dL (ref 0.44–1.00)
GFR calc Af Amer: >60 mL/min (ref >60)
GFR calc non Af Amer: >60 mL/min (ref >60)
Glucose, Bld: 114 mg/dL — ABNORMAL HIGH (ref 70–99)
Potassium: 4.4 mmol/L (ref 3.5–5.1)
Sodium: 140 mmol/L (ref 135–145)

## 2019-10-13 LAB — TROPONIN I (HIGH SENSITIVITY)
Troponin I (High Sensitivity): <2 ng/L (ref <18)
Troponin I (High Sensitivity): 4 ng/L (ref <18)

## 2019-10-13 LAB — PHOSPHORUS: Phosphorus: 2.6 mg/dL (ref 2.5–4.6)

## 2019-10-13 LAB — CK: Total CK: 48 U/L (ref 38–234)

## 2019-10-13 LAB — MAGNESIUM: Magnesium: 1.8 mg/dL (ref 1.7–2.4)

## 2019-10-13 LAB — D-DIMER, QUANTITATIVE: D-Dimer, Quant: 0.35 ug/mL-FEU (ref 0.00–0.50)

## 2019-10-13 SURGERY — CARDIOVERSION
Anesthesia: General

## 2019-10-13 MED ORDER — PROPOFOL 10 MG/ML IV BOLUS
INTRAVENOUS | Status: DC | PRN
  Administered 2019-10-13: 15 mg via INTRAVENOUS
  Administered 2019-10-13 (×2): 20 mg via INTRAVENOUS

## 2019-10-13 MED ORDER — SODIUM CHLORIDE 0.9 % IV SOLN: INTRAVENOUS | Status: AC

## 2019-10-13 MED ORDER — SODIUM CHLORIDE 0.9 % IV SOLN: INTRAVENOUS | Status: DC

## 2019-10-13 MED ORDER — MUSCLE RUB 10-15 % EX CREA
1.0000 "application " | TOPICAL_CREAM | CUTANEOUS | Status: DC | PRN
  Administered 2019-10-13: 1 via TOPICAL
  Filled 2019-10-13: qty 85

## 2019-10-13 MED ORDER — PROPOFOL 500 MG/50ML IV EMUL
INTRAVENOUS | Status: DC | PRN
  Administered 2019-10-13: 100 ug/kg/min via INTRAVENOUS

## 2019-10-13 MED ORDER — LACTATED RINGERS IV SOLN: INTRAVENOUS | Status: DC | PRN

## 2019-10-13 MED ORDER — APIXABAN 5 MG PO TABS
5.0000 mg | ORAL_TABLET | Freq: Two times a day (BID) | ORAL | 0 refills | Status: DC
Start: 2019-10-13 — End: 2019-10-15

## 2019-10-13 MED ORDER — LIDOCAINE 2% (20 MG/ML) 5 ML SYRINGE
INTRAMUSCULAR | Status: DC | PRN
  Administered 2019-10-13: 60 mg via INTRAVENOUS

## 2019-10-13 NOTE — Progress Notes (Addendum)
Nutrition Brief Note  RD consulted for assessment.   Wt Readings from Last 15 Encounters:  10/13/19 66.7 kg  05/25/19 65.7 kg  04/10/18 74.2 kg  05/19/15 68.1 kg  11/08/14 71.7 kg  12/23/11 67 kg (83 %, Z= 0.96)*  08/30/11 60.1 kg (67 %, Z= 0.45)*   * Growth percentiles are based on CDC (Girls, 2-20 Years) data.   Joyce Franco is a 24 y.o. female with a hx of endometriosis, asthma, allergies, food intolerance who was admitted for the evaluation of new onset Afib  Pt admitted with a-fib with RVR.  Reviewed I/O's: +2.1 L x 24 hours  Case discussed with RN prior to visit; plan for TEE with cardioversion today. Pt currently NPO for procedure.   Spoke with pt and mother at bedside. Both reports good appetite PTA. Pt shares she consumes 3 meals per day and often snacks throughout the days as well. She admits that she should be consuming more water, but has started drinking [employer] drinks. She also drinks soft drinks.   Pt denies any weight loss. She is anxious for her procedure so she can eat.   Pt with no questions or concerns at this time, but expressed appreciation for visit.   Nutrition-Focused physical exam completed. Findings are no fat depletion, no muscle depletion, and no edema.   Medications reviewed and include colace and 0.9% sodium chloride infusion @ 125 ml/hr.   Labs reviewed.  Current diet order is NPO, patient is consuming approximately0% of meals at this time. Labs and medications reviewed.   No nutrition interventions warranted at this time. If nutrition issues arise, please consult RD.   Joyce Franco, RD, LDN, CDCES Registered Dietitian II Certified Diabetes Care and Education Specialist Please refer to [facility] for RD and/or RD on-call/weekend/after hours pager

## 2019-10-13 NOTE — Anesthesia Postprocedure Evaluation (Signed)
Anesthesia Post Note  Patient: MISTEY HOFFERT  Procedure(s) Performed: CARDIOVERSION (N/A ) TRANSESOPHAGEAL ECHOCARDIOGRAM (TEE) (N/A )     Patient location during evaluation: PACU Anesthesia Type: General Level of consciousness: awake and alert Pain management: pain level controlled Vital Signs Assessment: post-procedure vital signs reviewed and stable Respiratory status: spontaneous breathing, nonlabored ventilation, respiratory function stable and patient connected to nasal cannula oxygen Cardiovascular status: blood pressure returned to baseline and stable Postop Assessment: no apparent nausea or vomiting Anesthetic complications: no   No complications documented.  Last Vitals:  Vitals:   10/13/19 1450 10/13/19 1513  BP: (!) 123/94 124/86  Pulse: 70 75  Resp: 15 18  Temp:  36.9 C  SpO2: 100%     Last Pain:  Vitals:   10/13/19 1513  TempSrc: Oral  PainSc:                  Shelton Silvas

## 2019-10-13 NOTE — Discharge Instructions (Signed)
Information on my medicine - ELIQUIS (apixaban)  This medication education was reviewed with me or my healthcare representative as part of my discharge preparation.     Why was Eliquis prescribed for you? Eliquis was prescribed for you to reduce the risk of a blood clot forming that can cause a stroke if you have a medical condition called atrial fibrillation (a type of irregular heartbeat).  What do You need to know about Eliquis ? Take your Eliquis TWICE DAILY - one tablet in the morning and one tablet in the evening with or without food. If you have difficulty swallowing the tablet whole please discuss with your pharmacist how to take the medication safely.  Take Eliquis exactly as prescribed by your doctor and DO NOT stop taking Eliquis without talking to the doctor who prescribed the medication.  Stopping may increase your risk of developing a stroke.  Refill your prescription before you run out.  After discharge, you should have regular check-up appointments with your healthcare provider that is prescribing your Eliquis.  In the future your dose may need to be changed if your kidney function or weight changes by a significant amount or as you get older.  What do you do if you miss a dose? If you miss a dose, take it as soon as you remember on the same day and resume taking twice daily.  Do not take more than one dose of ELIQUIS at the same time to make up a missed dose.  Important Safety Information A possible side effect of Eliquis is bleeding. You should call your healthcare provider right away if you experience any of the following: ? Bleeding from an injury or your nose that does not stop. ? Unusual colored urine (red or dark brown) or unusual colored stools (red or black). ? Unusual bruising for unknown reasons. ? A serious fall or if you hit your head (even if there is no bleeding).  Some medicines may interact with Eliquis and might increase your risk of bleeding or  clotting while on Eliquis. To help avoid this, consult your healthcare provider or pharmacist prior to using any new prescription or non-prescription medications, including herbals, vitamins, non-steroidal anti-inflammatory drugs (NSAIDs) and supplements.  This website has more information on Eliquis (apixaban): http://www.eliquis.com/eliquis/home  =====================================================  Atrial Fibrillation    Atrial fibrillation is a type of heartbeat that is irregular or fast. If you have this condition, your heart beats without any order. This makes it hard for your heart to pump blood in a normal way. Atrial fibrillation may come and go, or it may become a long-lasting problem. If this condition is not treated, it can put you at higher risk for stroke, heart failure, and other heart problems.  What are the causes? This condition may be caused by diseases that damage the heart. They include:  High blood pressure.  Heart failure.  Heart valve disease.  Heart surgery. Other causes include:  Diabetes.  Thyroid disease.  Being overweight.  Kidney disease. Sometimes the cause is not known.  What increases the risk? You are more likely to develop this condition if:  You are older.  You smoke.  You exercise often and very hard.  You have a family history of this condition.  You are a man.  You use drugs.  You drink a lot of alcohol.  You have lung conditions, such as emphysema, pneumonia, or COPD.  You have sleep apnea.   What are the signs or symptoms? Common symptoms   of this condition include:  A feeling that your heart is beating very fast.  Chest pain or discomfort.  Feeling short of breath.  Suddenly feeling light-headed or weak.  Getting tired easily during activity.  Fainting.  Sweating. In some cases, there are no symptoms.  How is this treated? Treatment for this condition depends on underlying conditions and how you feel  when you have atrial fibrillation. They include: 1. Medicines to: ? Prevent blood clots. ? Treat heart rate or heart rhythm problems. 2. Using devices, such as a pacemaker, to correct heart rhythm problems. 3. Doing surgery to remove the part of the heart that sends bad signals. 4. Closing an area where clots can form in the heart (left atrial appendage). In some cases, your doctor will treat other underlying conditions.  Follow these instructions at home:  Medicines 1. Take over-the-counter and prescription medicines only as told by your doctor. 2. Do not take any new medicines without first talking to your doctor. 3. If you are taking blood thinners: ? Talk with your doctor before you take any medicines that have aspirin or NSAIDs, such as ibuprofen, in them. ? Take your medicine exactly as told by your doctor. Take it at the same time each day. ? Avoid activities that could hurt or bruise you. Follow instructions about how to prevent falls. ? Wear a bracelet that says you are taking blood thinners. Or, carry a card that lists what medicines you take. Lifestyle          Do not use any products that have nicotine or tobacco in them. These include cigarettes, e-cigarettes, and chewing tobacco. If you need help quitting, ask your doctor.  Eat heart-healthy foods. Talk with your doctor about the right eating plan for you.  Exercise regularly as told by your doctor.  Do not drink alcohol.  Lose weight if you are overweight.  Do not use drugs, including cannabis.  General instructions  If you have a condition that causes breathing to stop for a short period of time (apnea), treat it as told by your doctor.  Keep a healthy weight. Do not use diet pills unless your doctor says they are safe for you. Diet pills may make heart problems worse.  Keep all follow-up visits as told by your doctor. This is important.  Contact a doctor if:  You notice a change in the speed, rhythm,  or strength of your heartbeat.  You are taking a blood-thinning medicine and you get more bruising.  You get tired more easily when you move or exercise.  You have a sudden change in weight.  Get help right away if:    1. You have pain in your chest or your belly (abdomen). 2. You have trouble breathing. 3. You have side effects of blood thinners, such as blood in your vomit, poop (stool), or pee (urine), or bleeding that cannot stop. 4. You have any signs of a stroke. "BE FAST" is an easy way to remember the main warning signs: ? B - Balance. Signs are dizziness, sudden trouble walking, or loss of balance. ? E - Eyes. Signs are trouble seeing or a change in how you see. ? F - Face. Signs are sudden weakness or loss of feeling in the face, or the face or eyelid drooping on one side. ? A - Arms. Signs are weakness or loss of feeling in an arm. This happens suddenly and usually on one side of the body. ? S - Speech.   Signs are sudden trouble speaking, slurred speech, or trouble understanding what people say. ? T - Time. Time to call emergency services. Write down what time symptoms started. 5. You have other signs of a stroke, such as: ? A sudden, very bad headache with no known cause. ? Feeling like you may vomit (nausea). ? Vomiting. ? A seizure.  These symptoms may be an emergency. Do not wait to see if the symptoms will go away. Get medical help right away. Call your local emergency services (911 in the [place] Do not drive yourself to the hospital. Summary  Atrial fibrillation is a type of heartbeat that is irregular or fast.  You are at higher risk of this condition if you smoke, are older, have diabetes, or are overweight.  Follow your doctor's instructions about medicines, diet, exercise, and follow-up visits.  Get help right away if you have signs or symptoms of a stroke.  Get help right away if you cannot catch your breath, or you have chest pain or discomfort. This  information is not intended to replace advice given to you by your health care provider. Make sure you discuss any questions you have with your health care provider. Document Revised: 10/12/2018 Document Reviewed: 10/12/2018 Elsevier Patient Education  2020 Elsevier Inc.     

## 2019-10-13 NOTE — Transfer of Care (Signed)
Immediate Anesthesia Transfer of Care Note  Patient: Joyce Franco  Procedure(s) Performed: CARDIOVERSION (N/A ) TRANSESOPHAGEAL ECHOCARDIOGRAM (TEE) (N/A )  Patient Location: Endoscopy Unit  Anesthesia Type:General  Level of Consciousness: awake, alert  and oriented  Airway & Oxygen Therapy: Patient Spontanous Breathing  Post-op Assessment: Report given to RN, Post -op Vital signs reviewed and stable and Patient moving all extremities X 4  Post vital signs: Reviewed and stable  Last Vitals:  Vitals Value Taken Time  BP    Temp    Pulse    Resp    SpO2      Last Pain:  Vitals:   10/13/19 1345  TempSrc: Oral  PainSc: 0-No pain         Complications: No complications documented.

## 2019-10-13 NOTE — Progress Notes (Signed)
  Echocardiogram Echocardiogram Transesophageal has been performed.  [name] 10/13/2019, 2:52 PM

## 2019-10-13 NOTE — Progress Notes (Signed)
° °  Dr. Flora Lipps requested to place this patient on TEE/DCCV schedule for today. Per our discussion, orders written - he discussed procedure consent, risks, benefits with patient yesterday. He had written order for NPO after midnight last night. Confirmed with nurse she has been NPO after midnight. She has been scheduled for 2pm with Dr. Rennis Golden.  Nubia Ziesmer PA-C

## 2019-10-13 NOTE — Progress Notes (Signed)
   10/12/19 2245  Assess: MEWS Score  Temp 98.3 F (36.8 C)  BP (!) 126/92  ECG Heart Rate (!) 127  Resp (!) 21  SpO2 96 %  O2 Device Room Air  Assess: MEWS Score  MEWS Temp 0  MEWS Systolic 0  MEWS Pulse 2  MEWS RR 1  MEWS LOC 0  MEWS Score 3  MEWS Score Color Yellow  Assess: if the MEWS score is Yellow or Red  Were vital signs taken at a resting state? Yes  Focused Assessment Documented focused assessment  Early Detection of Sepsis Score *See Row Information* Low  MEWS guidelines implemented *See Row Information* Yes  Treat  MEWS Interventions Administered scheduled meds/treatments;Administered prn meds/treatments  Take Vital Signs  Increase Vital Sign Frequency  Yellow: Q 2hr X 2 then Q 4hr X 2, if remains yellow, continue Q 4hrs  Escalate  MEWS: Escalate Yellow: discuss with charge nurse/RN and consider discussing with provider and RRT  Patient is A/Ox4, RA, and independent. Admitted with A.fib RVR and MD is aware of rhythm. Will treat with scheduled and PRN medications and notify provider if patient sustains HR >130.

## 2019-10-13 NOTE — Anesthesia Preprocedure Evaluation (Addendum)
Anesthesia Evaluation  Patient identified by MRN, date of birth, ID band Patient awake    Reviewed: Allergy & Precautions, NPO status , Patient's Chart, lab work & pertinent test results  Airway Mallampati: II  TM Distance: >3 FB Neck ROM: Full    Dental no notable dental hx. (+) Teeth Intact, Dental Advisory Given   Pulmonary asthma ,    Pulmonary exam normal breath sounds clear to auscultation       Cardiovascular Exercise Tolerance: Good Normal cardiovascular exam+ dysrhythmias Atrial Fibrillation  Rhythm:Regular Rate:Normal     Neuro/Psych negative neurological ROS  negative psych ROS   GI/Hepatic negative GI ROS, Neg liver ROS,   Endo/Other  negative endocrine ROS  Renal/GU negative Renal ROS     Musculoskeletal negative musculoskeletal ROS (+)   Abdominal   Peds  Hematology negative hematology ROS (+)   Anesthesia Other Findings   Reproductive/Obstetrics negative OB ROS                             Anesthesia Physical Anesthesia Plan  ASA: II  Anesthesia Plan: General   Post-op Pain Management:    Induction:   PONV Risk Score and Plan: Treatment may vary due to age or medical condition  Airway Management Planned: Nasal Cannula and Natural Airway  Additional Equipment: None  Intra-op Plan:   Post-operative Plan:   Informed Consent: I have reviewed the patients History and Physical, chart, labs and discussed the procedure including the risks, benefits and alternatives for the proposed anesthesia with the patient or authorized representative who has indicated his/her understanding and acceptance.     Dental advisory given  Plan Discussed with: CRNA  Anesthesia Plan Comments:        Anesthesia Quick Evaluation

## 2019-10-13 NOTE — CV Procedure (Signed)
TEE/CARDIOVERSION NOTE  TRANSESOPHAGEAL ECHOCARDIOGRAM (TEE):  Indictation: Atrial Fibrillation  Consent:   Informed consent was obtained prior to the procedure. The risks, benefits and alternatives for the procedure were discussed and the patient comprehended these risks.  Risks include, but are not limited to, cough, sore throat, vomiting, nausea, somnolence, esophageal and stomach trauma or perforation, bleeding, low blood pressure, aspiration, pneumonia, infection, trauma to the teeth and death.    Time Out: Verified patient identification, verified procedure, site/side was marked, verified correct patient position, special equipment/implants available, medications/allergies/relevent history reviewed, required imaging and test results available. Performed  Procedure:  After a procedural time-out, the patient was given propofol per anesthesia for sedation. The patient's heart rate, blood pressure, and oxygen saturation are monitored continuously during the procedure. The oropharynx was anesthetized with 2 topical cetacaine sprays.  The transesophageal probe was inserted in the esophagus and stomach without difficulty and multiple views were obtained. Agitated microbubble saline contrast was not administered.  Complications:    Complications: None Patient did tolerate procedure well.  Findings:  1. LEFT VENTRICLE: The left ventricular wall thickness is normal.  The left ventricular cavity is normal in size. Wall motion is normal.  LVEF is 55-60%.  2. RIGHT VENTRICLE:  The right ventricle is normal in structure and function without any thrombus or masses.    3. LEFT ATRIUM:  The left atrium is normal in size without any thrombus or masses.  There is not spontaneous echo contrast ("smoke") in the left atrium consistent with a low flow state.  4. LEFT ATRIAL APPENDAGE:  The left atrial appendage is free of any thrombus or masses. The appendage has single lobes. Pulse doppler  indicates moderate flow in the appendage.  5. ATRIAL SEPTUM:  The atrial septum appears intact and is free of thrombus and/or masses.  There is no evidence for interatrial shunting by color doppler and saline microbubble.  6. RIGHT ATRIUM:  The right atrium is normal in size and function without any thrombus or masses.  7. MITRAL VALVE:  The mitral valve is normal in structure and function with trivail regurgitation.  There were no vegetations or stenosis.  8. AORTIC VALVE:  The aortic valve is trileaflet, normal in structure and function with no regurgitation.  There were no vegetations or stenosis  9. TRICUSPID VALVE:  The tricuspid valve is normal in structure and function with no regurgitation.  There were no vegetations or stenosis  10.  PULMONIC VALVE:  The pulmonic valve is normal in structure and function with no regurgitation.  There were no vegetations or stenosis.   11. AORTIC ARCH, ASCENDING AND DESCENDING AORTA:  There was no Ron Parker et. Al, 1992) atherosclerosis of the ascending aorta, aortic arch, or proximal descending aorta.  12. PULMONARY VEINS: Anomalous pulmonary venous return was not noted.  13. PERICARDIUM: The pericardium appeared normal and non-thickened.  There is no pericardial effusion.  CARDIOVERSION:     Second Time Out: Verified patient identification, verified procedure, site/side was marked, verified correct patient position, special equipment/implants available, medications/allergies/relevent history reviewed, required imaging and test results available.  Performed  Procedure:  1. Patient placed on cardiac monitor, pulse oximetry, supplemental oxygen as necessary.  2. Sedation administered per anesthesia 3. Pacer pads placed anterior and posterior chest. 4. Cardioverted 1 time(s).  5. Cardioverted at 150J biphasic.  Complications:  Complications: None Patient did tolerate procedure well.  Impression:  1. No LAA thrombus 2. Negative for  PFO 3. Essentially normal echo with normal  LVEF 4. Successful DCCV with a single 150J biphasic shock  Recommendations:  1.  Agree with anticoagulation for at least 1 month given cardioversion, after that may discontinue given low CHADSVASC score of 1 (female). Etiology of afib is unclear - possibly lone afib.  Time Spent Directly with the Patient:  45 minutes   Chrystie Nose, MD, [facility], FACP  [place]  [facility]  Medical Director of the [facility]  [facility] Diplomate of the American Board of Clinical Lipidology Attending Cardiologist  Direct Dial: [phone]  Fax: [phone]  Website:  [email or web address] Nicely Anna Livers 10/13/2019, 2:32 PM

## 2019-10-13 NOTE — Anesthesia Procedure Notes (Signed)
Procedure Name: MAC Date/Time: 10/13/2019 2:08 PM Performed by: Harden Mo, CRNA Pre-anesthesia Checklist: Patient identified, Emergency Drugs available, Suction available and Patient being monitored Patient Re-evaluated:Patient Re-evaluated prior to induction Oxygen Delivery Method: Nasal cannula Preoxygenation: Pre-oxygenation with 100% oxygen Induction Type: IV induction Placement Confirmation: positive ETCO2 and breath sounds checked- equal and bilateral Dental Injury: Teeth and Oropharynx as per pre-operative assessment

## 2019-10-13 NOTE — Progress Notes (Signed)
Cardiology Progress Note  Patient ID: Joyce Franco MRN: [id number] DOB: [birth year] Date of Encounter: 10/13/2019  Primary Cardiologist: No primary care provider on file.  Subjective   Chief Complaint: Atrial fibrillation  HPI: Remains in atrial fibrillation.  Reports he feels the palpitations.  Heart rate not that controlled.  Plan for TEE cardioversion today.  On Eliquis.  ROS:  All other ROS reviewed and negative. Pertinent positives noted in the HPI.     Inpatient Medications  Scheduled Meds: . apixaban  5 mg Oral BID  . docusate sodium  100 mg Oral BID  . fluticasone furoate-vilanterol  1 puff Inhalation Daily  . metoprolol tartrate  25 mg Oral Q6H  . sodium chloride flush  3 mL Intravenous Q12H   Continuous Infusions:  PRN Meds: acetaminophen **OR** acetaminophen, albuterol, HYDROcodone-acetaminophen, ondansetron **OR** ondansetron (ZOFRAN) IV   Vital Signs   Vitals:   10/13/19 0057 10/13/19 0407 10/13/19 0732 10/13/19 0852  BP: 116/75 111/77 (!) 121/94   Pulse: 87 91 (!) 51 68  Resp: 18 18 18 15   Temp: 98.3 F (36.8 C) 98.1 F (36.7 C) 98.4 F (36.9 C)   TempSrc: Oral Oral Oral   SpO2: 96%  100% 100%  Weight: 66.4 kg     Height:        Intake/Output Summary (Last 24 hours) at 10/13/2019 1009 Last data filed at 10/13/2019 0700 Gross per 24 hour  Intake 2050 ml  Output --  Net 2050 ml   Last 3 Weights 10/13/2019 10/12/2019 10/12/2019  Weight (lbs) 146 lb 6.4 oz 145 lb 9.6 oz 145 lb 11.2 oz  Weight (kg) 66.407 kg 66.044 kg 66.089 kg      Telemetry  Overnight telemetry shows atrial fibrillation with heart rates in the 100-120 range, which I personally reviewed.   ECG  The most recent ECG shows atrial fibrillation, heart rate 96, no acute ST-T changes, no evidence of prior infarction, which I personally reviewed.   Physical Exam   Vitals:   10/13/19 0057 10/13/19 0407 10/13/19 0732 10/13/19 0852  BP: 116/75 111/77 (!) 121/94   Pulse: 87 91 (!) 51 68   Resp: 18 18 18 15   Temp: 98.3 F (36.8 C) 98.1 F (36.7 C) 98.4 F (36.9 C)   TempSrc: Oral Oral Oral   SpO2: 96%  100% 100%  Weight: 66.4 kg     Height:         Intake/Output Summary (Last 24 hours) at 10/13/2019 1009 Last data filed at 10/13/2019 0700 Gross per 24 hour  Intake 2050 ml  Output --  Net 2050 ml    Last 3 Weights 10/13/2019 10/12/2019 10/12/2019  Weight (lbs) 146 lb 6.4 oz 145 lb 9.6 oz 145 lb 11.2 oz  Weight (kg) 66.407 kg 66.044 kg 66.089 kg    Body mass index is 25.13 kg/m.  General: Well nourished, well developed, in no acute distress Head: Atraumatic, normal size  Eyes: PEERLA, EOMI  Neck: Supple, no JVD Endocrine: No thryomegaly Cardiac: Normal S1, S2; irregular rhythm, no murmurs rubs or gallops Lungs: Clear to auscultation bilaterally, no wheezing, rhonchi or rales  Abd: Soft, nontender, no hepatomegaly  Ext: No edema, pulses 2+ Musculoskeletal: No deformities, BUE and BLE strength normal and equal Skin: Warm and dry, no rashes   Neuro: Alert and oriented to person, place, time, and situation, CNII-XII grossly intact, no focal deficits  Psych: Normal mood and affect   Labs  High Sensitivity Troponin:   Recent Labs  Lab 10/13/19 0107 10/13/19 0539  TROPONINIHS <2 4     Cardiac EnzymesNo results for input(s): TROPONINI in the last 168 hours. No results for input(s): TROPIPOC in the last 168 hours.  Chemistry Recent Labs  Lab 10/12/19 1155  NA 140  K 5.2*  CL 104  CO2 18*  GLUCOSE 129*  BUN 11  CREATININE 0.97  CALCIUM 9.6  PROT 7.5  ALBUMIN 4.4  AST 32  ALT 21  ALKPHOS 47  BILITOT 1.6*  GFRNONAA >60  GFRAA >60  ANIONGAP 18*    Hematology Recent Labs  Lab 10/12/19 1155 10/13/19 0107  WBC 8.0 8.4  RBC 5.38* 4.83  HGB 15.3* 13.7  HCT 44.9 39.2  MCV 83.5 81.2  MCH 28.4 28.4  MCHC 34.1 34.9  RDW 13.5 13.2  PLT 307 274   BNPNo results for input(s): BNP, PROBNP in the last 168 hours.  DDimer  Recent Labs  Lab  10/13/19 0107  DDIMER 0.35     Radiology  CT Abdomen Pelvis W Contrast  Result Date: 10/12/2019 CLINICAL DATA:  Lower abdominal pain since yesterday. History of endometriosis. EXAM: CT ABDOMEN AND PELVIS WITH CONTRAST TECHNIQUE: Multidetector CT imaging of the abdomen and pelvis was performed using the standard protocol following bolus administration of intravenous contrast. CONTRAST:  OMNIPAQUE IOHEXOL 300 MG/ML  SOLN COMPARISON:  None. FINDINGS: Lower chest: The lung bases are clear of acute process. No pleural effusion or pulmonary lesions. The heart is normal in size. No pericardial effusion. The distal esophagus and aorta are unremarkable. Hepatobiliary: No focal hepatic lesions or intrahepatic biliary dilatation. The gallbladder is normal. No common bile duct dilatation. Pancreas: No mass, inflammation or ductal dilatation. Spleen: Normal size.  No focal lesions. Adrenals/Urinary Tract: The adrenal glands and kidneys are unremarkable. No renal, ureteral or bladder calculi or mass. No CT findings suspicious for pyelonephritis. Stomach/Bowel: The stomach, duodenum, small bowel and colon are grossly normal without oral contrast. No acute inflammatory changes, mass lesions or obstructive findings. The terminal ileum is normal. The appendix is normal. Vascular/Lymphatic: The aorta is normal in caliber. No dissection. The branch vessels are patent. The major venous structures are patent. No mesenteric or retroperitoneal mass or adenopathy. Small scattered lymph nodes are noted. Reproductive: The uterus and ovaries are unremarkable. Other: No pelvic mass or adenopathy. No free pelvic fluid collections. No inguinal mass or adenopathy. No abdominal wall hernia or subcutaneous lesions. Musculoskeletal: No significant bony findings. IMPRESSION: 1. No acute abdominal/pelvic findings, mass lesions or adenopathy. 2. No renal, ureteral or bladder calculi or mass. Electronically Signed   By: Rudie Meyer M.D.    On: 10/12/2019 17:23    Cardiac Studies  Echo pending  Patient Profile  Joyce Franco is a 24 y.o. female with history of endometriosis who was admitted on 10/12/2019 for new onset atrial fibrillation with RVR.  Assessment & Plan   1.  New onset atrial fibrillation:  -Admitted with endometriosis pain and nausea vomiting.  Acidotic on BMP.  Repeat BMP pending.  She was hydrated overnight.  Really unclear why this 24 year old female without significant medical history would have atrial fibrillation.  Echocardiograms are pending.  I see no wide QRS to suggest underlying accessory pathway or Wolff-Parkinson-White.  She has no evidence of murmur or clinical heart failure examination.  Troponins are negative for ischemia.  EKG has no ischemic changes.  She has no evidence of structural heart disease on my examination.  There is no murmurs rubs or gallops.  Thyroid studies are normal.  Her UDS was positive for marijuana and benzodiazepines.  Possibly this is drug related.  She really does not describe any sleep apnea.  She is young and healthy in my opinion. -Chads Vasc 1.  Unclear timing of atrial fibrillation.  She was given a loading dose of Eliquis last night 10 mg.  She did receive Eliquis 5 mg this morning.  We will proceed with TEE cardioversion today.  I did discuss the risk benefits of the procedure.  The risks include damage to teeth mouth and esophagus.  She describes no bleeding or difficulty swallowing.  We will use a bite block to protect her teeth and our equipment.  She is low risk from a cardioversion standpoint.  There is a small small risk of pacemaker implantation with significant bradycardia but I do not suspect this given her young age.  She is willing to proceed with the procedure. -She will need to take Eliquis for at least 1 month after her cardioversion.  She will not need long-term anticoagulation.  For questions or updates, please contact [facility] Please consult  [email or web address] for contact info under   Time Spent with Patient: I have spent a total of 25 minutes with patient reviewing hospital notes, telemetry, EKGs, labs and examining the patient as well as establishing an assessment and plan that was discussed with the patient.  > 50% of time was spent in direct patient care.    Signed, Lenna Gilford. Flora Lipps, MD [place]  [facility]  10/13/2019 10:09 AM

## 2019-10-13 NOTE — Progress Notes (Signed)
PROGRESS NOTE  Joyce Franco GUY:[id number] DOB: 23-Apr-1996 DOA: 10/12/2019 PCP: [name], MD  HPI/Recap of past 24 hours: HPI by Dr [name] is a 24 y.o. female with medical history significant of endometriosis, asthma, presented to the ED c/o abdominal pain, nausea and emesis unable to keep anything down. Of note, pt reported she is on her period and typically has severe abdominal pain, but this seems to be much worse (stated most likely due to her missing her last scheduled depot shot). Denies any other new complaints. Pt reports smoking marijuana. In the ED, pt was noted to be in Afib with HR around 150s. Pt was started on diltiazem drip, cardiology consulted. Pt admitted for further management.    Today, patient reported feeling okay, reports palpitations, denies chest pain, shortness of breath, still with abdominal pain but controlled with pain meds, denies any nausea/vomiting, fever/chills, diarrhea.     Assessment/Plan: Active Problems:   Mild persistent asthma, uncomplicated   Atrial fibrillation with rapid ventricular response (HCC)   Contrast media adverse reaction   Dehydration   Metabolic acidosis   Hyperkalemia  New onset A. Fib Status post TEE/successful DCCV on 10/13/2019 Currently in sinus rhythm, rate controlled Troponins are unremarkable, EKG no acute ST changes TSH WNL TEE showed no thrombus, negative for PFO, EF of 55 to 60% Cardiology consulted, plan for at least 1 month of anticoagulation given cardioversion, will probably discontinue after 1 month due to low CHA2DS2-VASc, no need for long-term AC Continue Eliquis Monitor closely, telemetry  Metabolic acidosis Improving Likely in the setting of dehydration Daily BMP  Mild persistent asthma Stable  History of endometriosis Follow-up with outpatient OB/GYN          Malnutrition Type:      Malnutrition Characteristics:      Nutrition Interventions:        Estimated body mass index is 25.23 kg/m as calculated from the following:   Height as of this encounter: 5\' 4"  (1.626 m).   Weight as of this encounter: 66.7 kg.     Code Status: Full  Family Communication: Discussed extensively with patient  Disposition Plan: Status is: Inpatient  Remains inpatient appropriate because:Inpatient level of care appropriate due to severity of illness   Dispo: The patient is from: Home              Anticipated d/c is to: Home              Anticipated d/c date is: 1 day              Patient currently is not medically stable to d/c.    Consultants:  Cardiology  Procedures:  TEE/DCCV on 10/13/19  Antimicrobials:  NOne  DVT prophylaxis: Eliquis   Objective: Vitals:   10/13/19 1530 10/13/19 1545 10/13/19 1600 10/13/19 1704  BP: 126/88 (!) 121/91 (!) 123/91 119/81  Pulse: 75 70 70 76  Resp:      Temp:      TempSrc:      SpO2:      Weight:      Height:        Intake/Output Summary (Last 24 hours) at 10/13/2019 1804 Last data filed at 10/13/2019 1241 Gross per 24 hour  Intake 0 ml  Output --  Net 0 ml   Filed Weights   10/12/19 2245 10/13/19 0057 10/13/19 1345  Weight: 66 kg 66.4 kg 66.7 kg    Exam:  General: NAD   Cardiovascular:  S1, S2 present  Respiratory: CTAB  Abdomen: Soft, nontender, nondistended, bowel sounds present  Musculoskeletal: No bilateral pedal edema noted  Skin: Normal  Psychiatry: Normal mood    Data Reviewed: CBC: Recent Labs  Lab 10/12/19 1155 10/13/19 0107  WBC 8.0 8.4  NEUTROABS 5.5 7.4  HGB 15.3* 13.7  HCT 44.9 39.2  MCV 83.5 81.2  PLT 307 824   Basic Metabolic Panel: Recent Labs  Lab 10/12/19 1155 10/13/19 0107 10/13/19 0539  NA 140  --  140  K 5.2*  --  4.4  CL 104  --  110  CO2 18*  --  21*  GLUCOSE 129*  --  114*  BUN 11  --  7  CREATININE 0.97  --  0.75  CALCIUM 9.6  --  8.2*  MG  --  1.8  --   PHOS  --  2.6  --    GFR: Estimated Creatinine Clearance: 101  mL/min (by C-G formula based on SCr of 0.75 mg/dL). Liver Function Tests: Recent Labs  Lab 10/12/19 1155  AST 32  ALT 21  ALKPHOS 47  BILITOT 1.6*  PROT 7.5  ALBUMIN 4.4   Recent Labs  Lab 10/12/19 1155  LIPASE 22   No results for input(s): AMMONIA in the last 168 hours. Coagulation Profile: No results for input(s): INR, PROTIME in the last 168 hours. Cardiac Enzymes: Recent Labs  Lab 10/13/19 0107  CKTOTAL 48   BNP (last 3 results) No results for input(s): PROBNP in the last 8760 hours. HbA1C: No results for input(s): HGBA1C in the last 72 hours. CBG: No results for input(s): GLUCAP in the last 168 hours. Lipid Profile: No results for input(s): CHOL, HDL, LDLCALC, TRIG, CHOLHDL, LDLDIRECT in the last 72 hours. Thyroid Function Tests: Recent Labs    10/12/19 1938  TSH 0.693   Anemia Panel: No results for input(s): VITAMINB12, FOLATE, FERRITIN, TIBC, IRON, RETICCTPCT in the last 72 hours. Urine analysis:    Component Value Date/Time   COLORURINE RED (A) 10/12/2019 1711   APPEARANCEUR HAZY (A) 10/12/2019 1711   LABSPEC 1.046 (H) 10/12/2019 1711   PHURINE 6.0 10/12/2019 1711   GLUCOSEU 150 (A) 10/12/2019 1711   HGBUR LARGE (A) 10/12/2019 1711   BILIRUBINUR NEGATIVE 10/12/2019 1711   KETONESUR 20 (A) 10/12/2019 1711   PROTEINUR 100 (A) 10/12/2019 1711   UROBILINOGEN 0.2 06/26/2014 1925   NITRITE NEGATIVE 10/12/2019 1711   LEUKOCYTESUR NEGATIVE 10/12/2019 1711   Sepsis Labs: @LABRCNTIP (procalcitonin:4,lacticidven:4)  ) Recent Results (from the past 240 hour(s))  SARS Coronavirus 2 by RT PCR (hospital order, performed in [place] hospital lab) Nasopharyngeal Nasopharyngeal Swab     Status: None   Collection Time: 10/12/19  3:22 PM   Specimen: Nasopharyngeal Swab  Result Value Ref Range Status   SARS Coronavirus 2 NEGATIVE NEGATIVE Final    Comment: (NOTE) SARS-CoV-2 target nucleic acids are NOT DETECTED.  The SARS-CoV-2 RNA is generally detectable  in upper and lower respiratory specimens during the acute phase of infection. The lowest concentration of SARS-CoV-2 viral copies this assay can detect is 250 copies / mL. A negative result does not preclude SARS-CoV-2 infection and should not be used as the sole basis for treatment or other patient management decisions.  A negative result may occur with improper specimen collection / handling, submission of specimen other than nasopharyngeal swab, presence of viral mutation(s) within the areas targeted by this assay, and inadequate number of viral copies (<250 copies / mL). A negative  result must be combined with clinical observations, patient history, and epidemiological information.  Fact Sheet for Patients:   BoilerBrush.com.cy  Fact Sheet for Healthcare Providers: https://pope.com/  This test is not yet approved or  cleared by the [place] FDA and has been authorized for detection and/or diagnosis of SARS-CoV-2 by FDA under an Emergency Use Authorization (EUA).  This EUA will remain in effect (meaning this test can be used) for the duration of the COVID-19 declaration under Section 564(b)(1) of the Act, 21 U.S.C. section 360bbb-3(b)(1), unless the authorization is terminated or revoked sooner.  Performed at [facility], [address], [place], [place] [address]       Studies: ECHO TEE  Result Date: 10/13/2019    TRANSESOPHOGEAL ECHO REPORT   Patient Name:   Joyce Franco Date of Exam: 10/13/2019 Medical Rec #:  [id number]     Height:       64.0 in Accession #:    [id number]    Weight:       146.4 lb Date of Birth:  12-Nov-1995     BSA:          1.713 m Patient Age:    23 years      BP:           124/105 mmHg Patient Gender: F             HR:           106 bpm. Exam Location:  Inpatient Procedure: Transesophageal Echo, Cardiac Doppler and Color Doppler Indications:     I48.0 Paroxysmal atrial fibrillation  History:          Patient has no prior history of Echocardiogram examinations.  Sonographer:     Tiffany Dance Referring Phys:  [id number] CXKGY J EHUD Diagnosing Phys: Zoila Shutter MD PROCEDURE: The transesophogeal probe was passed without difficulty through the esophogus of the patient. Local oropharyngeal anesthetic was provided with Cetacaine. Sedation performed by different physician. The patient was monitored while under deep sedation. Anesthestetic sedation was provided intravenously by Anesthesiology: 171.39mg  of Propofol, 60mg  of Lidocaine. The patient developed no complications during the procedure. IMPRESSIONS  1. Left ventricular ejection fraction, by estimation, is 60 to 65%. The left ventricle has normal function. The left ventricle has no regional wall motion abnormalities.  2. Right ventricular systolic function is normal. The right ventricular size is normal.  3. No left atrial/left atrial appendage thrombus was detected.  4. The mitral valve is grossly normal. Trivial mitral valve regurgitation.  5. The aortic valve is tricuspid. Aortic valve regurgitation is not visualized. Conclusion(s)/Recommendation(s): No LA/LAA thrombus identified. Successful cardioversion performed with restoration of normal sinus rhythm. FINDINGS  Left Ventricle: Left ventricular ejection fraction, by estimation, is 60 to 65%. The left ventricle has normal function. The left ventricle has no regional wall motion abnormalities. The left ventricular internal cavity size was normal in size. There is  no left ventricular hypertrophy. Right Ventricle: The right ventricular size is normal. No increase in right ventricular wall thickness. Right ventricular systolic function is normal. Left Atrium: Left atrial size was normal in size. No left atrial/left atrial appendage thrombus was detected. Right Atrium: Right atrial size was normal in size. Pericardium: There is no evidence of pericardial effusion. Mitral Valve: The mitral valve is grossly normal.  Trivial mitral valve regurgitation. Tricuspid Valve: The tricuspid valve is normal in structure. Tricuspid valve regurgitation is not demonstrated. Aortic Valve: The aortic valve is tricuspid. Aortic valve regurgitation is  not visualized. Pulmonic Valve: The pulmonic valve was normal in structure. Pulmonic valve regurgitation is not visualized. Aorta: The aortic root and ascending aorta are structurally normal, with no evidence of dilitation. IAS/Shunts: No atrial level shunt detected by color flow Doppler. Zoila Shutter MD Electronically signed by Zoila Shutter MD Signature Date/Time: 10/13/2019/3:50:15 PM    Final     Scheduled Meds: . apixaban  5 mg Oral BID  . docusate sodium  100 mg Oral BID  . fluticasone furoate-vilanterol  1 puff Inhalation Daily  . metoprolol tartrate  25 mg Oral Q6H  . sodium chloride flush  3 mL Intravenous Q12H    Continuous Infusions: . sodium chloride 100 mL/hr at 10/13/19 1529     LOS: 0 days     Briant Cedar, MD [facility]  If 7PM-7AM, please contact night-coverage [email or web address] 10/13/2019, 6:04 PM

## 2019-10-13 NOTE — TOC Benefit Eligibility Note (Signed)
Transition of Care [facility]) Benefit Eligibility Note    Patient Details  Name: Joyce Franco MRN: [id number] Date of Birth: 27-Mar-1996   Medication/Dose: Everlene Balls  50 MG BID  Covered?: Yes  Tier: 2 Drug  Prescription Coverage Preferred [facility]acy: [facility] with Person/Company/Phone Number:: JACKIE  @   [employer] # [phone]  Co-Pay: $ 105.06  Prior Approval: No  Deductible: Unmet       Mardene Sayer Phone Number: 10/13/2019, 4:42 PM

## 2019-10-13 NOTE — H&P (Signed)
   INTERVAL PROCEDURE H&P  History and Physical Interval Note:  10/13/2019 1:52 PM  Joyce Franco has presented today for their planned procedure. The various methods of treatment have been discussed with the patient and family. After consideration of risks, benefits and other options for treatment, the patient has consented to the procedure.  The patients' outpatient history has been reviewed, patient examined, and no change in status from most recent office note within the past 30 days. I have reviewed the patients' chart and labs and will proceed as planned. Questions were answered to the patient's satisfaction.   Chrystie Nose, MD, [facility], FACP  [place]  [facility]  Medical Director of the [facility] Lipid [facility]  Cardiovascular Risk Reduction Clinic Diplomate of the American Board of Clinical Lipidology Attending Cardiologist  Direct Dial: [phone]  Fax: [phone]  Website:  [email or web address] 10/13/2019, 1:52 PM

## 2019-10-14 LAB — CBC
HCT: 33.2 % — ABNORMAL LOW (ref 36.0–46.0)
Hemoglobin: 11.5 g/dL — ABNORMAL LOW (ref 12.0–15.0)
MCH: 28.6 pg (ref 26.0–34.0)
MCHC: 34.6 g/dL (ref 30.0–36.0)
MCV: 82.6 fL (ref 80.0–100.0)
Platelets: 224 10*3/uL (ref 150–400)
RBC: 4.02 MIL/uL (ref 3.87–5.11)
RDW: 13.6 % (ref 11.5–15.5)
WBC: 10 10*3/uL (ref 4.0–10.5)
nRBC: 0 % (ref 0.0–0.2)

## 2019-10-14 LAB — BASIC METABOLIC PANEL
Anion gap: 6 (ref 5–15)
BUN: 13 mg/dL (ref 6–20)
CO2: 22 mmol/L (ref 22–32)
Calcium: 7.9 mg/dL — ABNORMAL LOW (ref 8.9–10.3)
Chloride: 110 mmol/L (ref 98–111)
Creatinine, Ser: 0.8 mg/dL (ref 0.44–1.00)
GFR calc Af Amer: >60 mL/min (ref >60)
GFR calc non Af Amer: >60 mL/min (ref >60)
Glucose, Bld: 86 mg/dL (ref 70–99)
Potassium: 3.4 mmol/L — ABNORMAL LOW (ref 3.5–5.1)
Sodium: 138 mmol/L (ref 135–145)

## 2019-10-14 MED ORDER — POTASSIUM CHLORIDE CRYS ER 20 MEQ PO TBCR
40.0000 meq | EXTENDED_RELEASE_TABLET | Freq: Once | ORAL | Status: AC
  Administered 2019-10-14: 40 meq via ORAL
  Filled 2019-10-14: qty 2

## 2019-10-14 MED ORDER — TRAZODONE HCL 50 MG PO TABS
50.0000 mg | ORAL_TABLET | Freq: Every day | ORAL | Status: DC
  Administered 2019-10-14: 50 mg via ORAL
  Filled 2019-10-14: qty 1

## 2019-10-14 MED ORDER — PNEUMOCOCCAL VAC POLYVALENT 25 MCG/0.5ML IJ INJ
0.5000 mL | INJECTION | INTRAMUSCULAR | Status: AC
  Administered 2019-10-15: 0.5 mL via INTRAMUSCULAR
  Filled 2019-10-14: qty 0.5

## 2019-10-14 NOTE — Progress Notes (Signed)
PROGRESS NOTE  Joyce Franco ION:[id number] DOB: Jul 17, 1995 DOA: 10/12/2019 PCP: [name], MD  HPI/Recap of past 24 hours: HPI by Dr [name] is a 24 y.o. female with medical history significant of endometriosis, asthma, presented to the ED c/o abdominal pain, nausea and emesis unable to keep anything down. Of note, pt reported she is on her period and typically has severe abdominal pain, but this seems to be much worse (stated most likely due to her missing her last scheduled depot shot). Denies any other new complaints. Pt reports smoking marijuana. In the ED, pt was noted to be in Afib with HR around 150s. Pt was started on diltiazem drip, cardiology consulted. Pt admitted for further management.    Today, pt continues to complain of chest burning, reproducible pain s/p DCCV. Anxious about pain. Had some chest discomfort and tightness upon ambulation. Denies any other new complaints.     Assessment/Plan: Active Problems:   Mild persistent asthma, uncomplicated   Atrial fibrillation with rapid ventricular response (HCC)   Contrast media adverse reaction   Dehydration   Metabolic acidosis   Hyperkalemia  New onset A. Fib Status post TEE/successful DCCV on 10/13/2019 Currently in sinus rhythm, rate controlled Continues to complain of chest burning, reproducible pain s/p DCCV Troponins are unremarkable, EKG no acute ST changes TSH WNL TEE showed no thrombus, negative for PFO, EF of 55 to 60% Cardiology consulted, plan for at least 1 month of anticoagulation given cardioversion, will probably discontinue after 1 month due to low CHA2DS2-VASc, no need for long-term AC Continue Eliquis Monitor closely, telemetry  Metabolic acidosis Resolved Likely in the setting of dehydration Daily BMP  Mild persistent asthma Stable  History of endometriosis Follow-up with outpatient OB/GYN          Malnutrition Type:      Malnutrition Characteristics:       Nutrition Interventions:       Estimated body mass index is 25.61 kg/m as calculated from the following:   Height as of this encounter: 5\' 4"  (1.626 m).   Weight as of this encounter: 67.7 kg.     Code Status: Full  Family Communication: Discussed extensively with patient  Disposition Plan: Status is: Inpatient  Remains inpatient appropriate because:Inpatient level of care appropriate due to severity of illness   Dispo: The patient is from: Home              Anticipated d/c is to: Home              Anticipated d/c date is: 1 day              Patient currently is not medically stable to d/c.    Consultants:  Cardiology  Procedures:  TEE/DCCV on 10/13/19  Antimicrobials:  NOne  DVT prophylaxis: Eliquis   Objective: Vitals:   10/14/19 0536 10/14/19 0725 10/14/19 0731 10/14/19 1158  BP: 116/73  122/86 119/89  Pulse: 84  81 71  Resp: 18   16  Temp: 98.6 F (37 C)  98.4 F (36.9 C) 99 F (37.2 C)  TempSrc: Oral  Oral   SpO2: 98% 99% 100% 100%  Weight:      Height:        Intake/Output Summary (Last 24 hours) at 10/14/2019 1737 Last data filed at 10/14/2019 1300 Gross per 24 hour  Intake 682 ml  Output --  Net 682 ml   Filed Weights   10/13/19 0057 10/13/19 1345 10/14/19 0115  Weight: 66.4 kg 66.7 kg 67.7 kg    Exam:  General: NAD   Cardiovascular: S1, S2 present  Respiratory: CTAB  Abdomen: Soft, nontender, nondistended, bowel sounds present  Musculoskeletal: No bilateral pedal edema noted  Skin: Normal  Psychiatry: Normal mood    Data Reviewed: CBC: Recent Labs  Lab 10/12/19 1155 10/13/19 0107 10/14/19 0657  WBC 8.0 8.4 10.0  NEUTROABS 5.5 7.4  --   HGB 15.3* 13.7 11.5*  HCT 44.9 39.2 33.2*  MCV 83.5 81.2 82.6  PLT 307 274 962   Basic Metabolic Panel: Recent Labs  Lab 10/12/19 1155 10/13/19 0107 10/13/19 0539 10/14/19 0657  NA 140  --  140 138  K 5.2*  --  4.4 3.4*  CL 104  --  110 110  CO2 18*  --  21* 22   GLUCOSE 129*  --  114* 86  BUN 11  --  7 13  CREATININE 0.97  --  0.75 0.80  CALCIUM 9.6  --  8.2* 7.9*  MG  --  1.8  --   --   PHOS  --  2.6  --   --    GFR: Estimated Creatinine Clearance: 101.7 mL/min (by C-G formula based on SCr of 0.8 mg/dL). Liver Function Tests: Recent Labs  Lab 10/12/19 1155  AST 32  ALT 21  ALKPHOS 47  BILITOT 1.6*  PROT 7.5  ALBUMIN 4.4   Recent Labs  Lab 10/12/19 1155  LIPASE 22   No results for input(s): AMMONIA in the last 168 hours. Coagulation Profile: No results for input(s): INR, PROTIME in the last 168 hours. Cardiac Enzymes: Recent Labs  Lab 10/13/19 0107  CKTOTAL 48   BNP (last 3 results) No results for input(s): PROBNP in the last 8760 hours. HbA1C: No results for input(s): HGBA1C in the last 72 hours. CBG: No results for input(s): GLUCAP in the last 168 hours. Lipid Profile: No results for input(s): CHOL, HDL, LDLCALC, TRIG, CHOLHDL, LDLDIRECT in the last 72 hours. Thyroid Function Tests: Recent Labs    10/12/19 1938  TSH 0.693   Anemia Panel: No results for input(s): VITAMINB12, FOLATE, FERRITIN, TIBC, IRON, RETICCTPCT in the last 72 hours. Urine analysis:    Component Value Date/Time   COLORURINE RED (A) 10/12/2019 1711   APPEARANCEUR HAZY (A) 10/12/2019 1711   LABSPEC 1.046 (H) 10/12/2019 1711   PHURINE 6.0 10/12/2019 1711   GLUCOSEU 150 (A) 10/12/2019 1711   HGBUR LARGE (A) 10/12/2019 1711   BILIRUBINUR NEGATIVE 10/12/2019 1711   KETONESUR 20 (A) 10/12/2019 1711   PROTEINUR 100 (A) 10/12/2019 1711   UROBILINOGEN 0.2 06/26/2014 1925   NITRITE NEGATIVE 10/12/2019 1711   LEUKOCYTESUR NEGATIVE 10/12/2019 1711   Sepsis Labs: @LABRCNTIP (procalcitonin:4,lacticidven:4)  ) Recent Results (from the past 240 hour(s))  SARS Coronavirus 2 by RT PCR (hospital order, performed in [place] hospital lab) Nasopharyngeal Nasopharyngeal Swab     Status: None   Collection Time: 10/12/19  3:22 PM   Specimen:  Nasopharyngeal Swab  Result Value Ref Range Status   SARS Coronavirus 2 NEGATIVE NEGATIVE Final    Comment: (NOTE) SARS-CoV-2 target nucleic acids are NOT DETECTED.  The SARS-CoV-2 RNA is generally detectable in upper and lower respiratory specimens during the acute phase of infection. The lowest concentration of SARS-CoV-2 viral copies this assay can detect is 250 copies / mL. A negative result does not preclude SARS-CoV-2 infection and should not be used as the sole basis for treatment or other patient management  decisions.  A negative result may occur with improper specimen collection / handling, submission of specimen other than nasopharyngeal swab, presence of viral mutation(s) within the areas targeted by this assay, and inadequate number of viral copies (<250 copies / mL). A negative result must be combined with clinical observations, patient history, and epidemiological information.  Fact Sheet for Patients:   BoilerBrush.com.cy  Fact Sheet for Healthcare Providers: https://pope.com/  This test is not yet approved or  cleared by the [place] FDA and has been authorized for detection and/or diagnosis of SARS-CoV-2 by FDA under an Emergency Use Authorization (EUA).  This EUA will remain in effect (meaning this test can be used) for the duration of the COVID-19 declaration under Section 564(b)(1) of the Act, 21 U.S.C. section 360bbb-3(b)(1), unless the authorization is terminated or revoked sooner.  Performed at [facility], [address], [place], [place] [address]       Studies: No results found.  Scheduled Meds: . apixaban  5 mg Oral BID  . docusate sodium  100 mg Oral BID  . fluticasone furoate-vilanterol  1 puff Inhalation Daily  . metoprolol tartrate  25 mg Oral Q6H  . [START ON 10/15/2019] pneumococcal 23 valent vaccine  0.5 mL Intramuscular Tomorrow-1000  . sodium chloride flush  3 mL Intravenous  Q12H  . traZODone  50 mg Oral QHS    Continuous Infusions:    LOS: 1 day     Briant Cedar, MD [facility]  If 7PM-7AM, please contact night-coverage [email or web address] 10/14/2019, 5:37 PM

## 2019-10-14 NOTE — Progress Notes (Signed)
Progress Note  Patient Name: Joyce Franco Date of Encounter: 10/14/2019  [facility] Cardiologist: New--Dr. Flora Lipps  Subjective   Tolerated TEE-CV well yesterday. She had some residual skin sensitivity but no clear skin damage. Still feels like her heart is irregular though she is confirmed in sinus. Discussed plan for management and follow up. She is still having endometrial pain but otherwise doing well.  Inpatient Medications    Scheduled Meds: . apixaban  5 mg Oral BID  . docusate sodium  100 mg Oral BID  . fluticasone furoate-vilanterol  1 puff Inhalation Daily  . metoprolol tartrate  25 mg Oral Q6H  . [START ON 10/15/2019] pneumococcal 23 valent vaccine  0.5 mL Intramuscular Tomorrow-1000  . sodium chloride flush  3 mL Intravenous Q12H   Continuous Infusions:  PRN Meds: acetaminophen **OR** acetaminophen, albuterol, HYDROcodone-acetaminophen, Muscle Rub, ondansetron **OR** ondansetron (ZOFRAN) IV   Vital Signs    Vitals:   10/14/19 0115 10/14/19 0536 10/14/19 0725 10/14/19 0731  BP: 121/72 116/73  122/86  Pulse: 83 84  81  Resp: 18 18    Temp: 98.6 F (37 C) 98.6 F (37 C)  98.4 F (36.9 C)  TempSrc: Oral Oral  Oral  SpO2: 96% 98% 99% 100%  Weight: 67.7 kg     Height:        Intake/Output Summary (Last 24 hours) at 10/14/2019 0809 Last data filed at 10/14/2019 0300 Gross per 24 hour  Intake 462 ml  Output --  Net 462 ml   Last 3 Weights 10/14/2019 10/13/2019 10/13/2019  Weight (lbs) 149 lb 3.2 oz 147 lb 146 lb 6.4 oz  Weight (kg) 67.677 kg 66.679 kg 66.407 kg      Telemetry    Post TEE-CV, has remained in sinus rhythm - Personally Reviewed  ECG    NSR at 71 bpm - Personally Reviewed  Physical Exam   GEN: No acute distress.   Neck: No JVD Cardiac: RRR, no murmurs, rubs, or gallops.  Respiratory: Clear to auscultation bilaterally. GI: Soft, nontender, non-distended  MS: No edema; No deformity. Neuro:  Nonfocal  Psych: Normal affect   Labs     High Sensitivity Troponin:   Recent Labs  Lab 10/13/19 0107 10/13/19 0539  TROPONINIHS <2 4      Chemistry Recent Labs  Lab 10/12/19 1155 10/13/19 0539  NA 140 140  K 5.2* 4.4  CL 104 110  CO2 18* 21*  GLUCOSE 129* 114*  BUN 11 7  CREATININE 0.97 0.75  CALCIUM 9.6 8.2*  PROT 7.5  --   ALBUMIN 4.4  --   AST 32  --   ALT 21  --   ALKPHOS 47  --   BILITOT 1.6*  --   GFRNONAA >60 >60  GFRAA >60 >60  ANIONGAP 18* 9     Hematology Recent Labs  Lab 10/12/19 1155 10/13/19 0107 10/14/19 0657  WBC 8.0 8.4 10.0  RBC 5.38* 4.83 4.02  HGB 15.3* 13.7 11.5*  HCT 44.9 39.2 33.2*  MCV 83.5 81.2 82.6  MCH 28.4 28.4 28.6  MCHC 34.1 34.9 34.6  RDW 13.5 13.2 13.6  PLT 307 274 224    BNPNo results for input(s): BNP, PROBNP in the last 168 hours.   DDimer  Recent Labs  Lab 10/13/19 0107  DDIMER 0.35     Radiology    CT Abdomen Pelvis W Contrast  Result Date: 10/12/2019 CLINICAL DATA:  Lower abdominal pain since yesterday. History of endometriosis. EXAM:  CT ABDOMEN AND PELVIS WITH CONTRAST TECHNIQUE: Multidetector CT imaging of the abdomen and pelvis was performed using the standard protocol following bolus administration of intravenous contrast. CONTRAST:  OMNIPAQUE IOHEXOL 300 MG/ML  SOLN COMPARISON:  None. FINDINGS: Lower chest: The lung bases are clear of acute process. No pleural effusion or pulmonary lesions. The heart is normal in size. No pericardial effusion. The distal esophagus and aorta are unremarkable. Hepatobiliary: No focal hepatic lesions or intrahepatic biliary dilatation. The gallbladder is normal. No common bile duct dilatation. Pancreas: No mass, inflammation or ductal dilatation. Spleen: Normal size.  No focal lesions. Adrenals/Urinary Tract: The adrenal glands and kidneys are unremarkable. No renal, ureteral or bladder calculi or mass. No CT findings suspicious for pyelonephritis. Stomach/Bowel: The stomach, duodenum, small bowel and colon are  grossly normal without oral contrast. No acute inflammatory changes, mass lesions or obstructive findings. The terminal ileum is normal. The appendix is normal. Vascular/Lymphatic: The aorta is normal in caliber. No dissection. The branch vessels are patent. The major venous structures are patent. No mesenteric or retroperitoneal mass or adenopathy. Small scattered lymph nodes are noted. Reproductive: The uterus and ovaries are unremarkable. Other: No pelvic mass or adenopathy. No free pelvic fluid collections. No inguinal mass or adenopathy. No abdominal wall hernia or subcutaneous lesions. Musculoskeletal: No significant bony findings. IMPRESSION: 1. No acute abdominal/pelvic findings, mass lesions or adenopathy. 2. No renal, ureteral or bladder calculi or mass. Electronically Signed   By: Rudie Meyer M.D.   On: 10/12/2019 17:23   ECHO TEE  Result Date: 10/13/2019    TRANSESOPHOGEAL ECHO REPORT   Patient Name:   Joyce Franco Date of Exam: 10/13/2019 Medical Rec #:  [id number]     Height:       64.0 in Accession #:    [id number]    Weight:       146.4 lb Date of Birth:  06/01/1995     BSA:          1.713 m Patient Age:    23 years      BP:           124/105 mmHg Patient Gender: F             HR:           106 bpm. Exam Location:  Inpatient Procedure: Transesophageal Echo, Cardiac Doppler and Color Doppler Indications:     I48.0 Paroxysmal atrial fibrillation  History:         Patient has no prior history of Echocardiogram examinations.  Sonographer:     Tiffany Dance Referring Phys:  [id number] F KCLE Diagnosing Phys: Zoila Shutter MD PROCEDURE: The transesophogeal probe was passed without difficulty through the esophogus of the patient. Local oropharyngeal anesthetic was provided with Cetacaine. Sedation performed by different physician. The patient was monitored while under deep sedation. Anesthestetic sedation was provided intravenously by Anesthesiology: 171.39mg  of Propofol, 60mg  of Lidocaine. The patient  developed no complications during the procedure. IMPRESSIONS  1. Left ventricular ejection fraction, by estimation, is 60 to 65%. The left ventricle has normal function. The left ventricle has no regional wall motion abnormalities.  2. Right ventricular systolic function is normal. The right ventricular size is normal.  3. No left atrial/left atrial appendage thrombus was detected.  4. The mitral valve is grossly normal. Trivial mitral valve regurgitation.  5. The aortic valve is tricuspid. Aortic valve regurgitation is not visualized. Conclusion(s)/Recommendation(s): No LA/LAA thrombus identified. Successful cardioversion performed with restoration  of normal sinus rhythm. FINDINGS  Left Ventricle: Left ventricular ejection fraction, by estimation, is 60 to 65%. The left ventricle has normal function. The left ventricle has no regional wall motion abnormalities. The left ventricular internal cavity size was normal in size. There is  no left ventricular hypertrophy. Right Ventricle: The right ventricular size is normal. No increase in right ventricular wall thickness. Right ventricular systolic function is normal. Left Atrium: Left atrial size was normal in size. No left atrial/left atrial appendage thrombus was detected. Right Atrium: Right atrial size was normal in size. Pericardium: There is no evidence of pericardial effusion. Mitral Valve: The mitral valve is grossly normal. Trivial mitral valve regurgitation. Tricuspid Valve: The tricuspid valve is normal in structure. Tricuspid valve regurgitation is not demonstrated. Aortic Valve: The aortic valve is tricuspid. Aortic valve regurgitation is not visualized. Pulmonic Valve: The pulmonic valve was normal in structure. Pulmonic valve regurgitation is not visualized. Aorta: The aortic root and ascending aorta are structurally normal, with no evidence of dilitation. IAS/Shunts: No atrial level shunt detected by color flow Doppler. Lyman Bishop MD Electronically  signed by Lyman Bishop MD Signature Date/Time: 10/13/2019/3:50:15 PM    Final     Cardiac Studies   TEE-CV 10/13/19 1. Left ventricular ejection fraction, by estimation, is 60 to 65%. The  left ventricle has normal function. The left ventricle has no regional  wall motion abnormalities.  2. Right ventricular systolic function is normal. The right ventricular  size is normal.  3. No left atrial/left atrial appendage thrombus was detected.  4. The mitral valve is grossly normal. Trivial mitral valve  regurgitation.  5. The aortic valve is tricuspid. Aortic valve regurgitation is not  visualized.   Conclusion(s)/Recommendation(s): No LA/LAA thrombus identified. Successful  cardioversion performed with restoration of normal sinus rhythm.   Patient Profile     24 y.o. female with PMH endometriosis admitted with new onset atrial fibrillation with RVR. Now s/p TEE-CV 10/13/19  Assessment & Plan    Atrial fibrillation with RVR -s/p TEE-CV 6/11, remains in sinus rhythm -chadsvasc 1 -continue apixaban for 1 mos, then discontinue  [facility] will sign off.   Medication Recommendations: apixaban for 1 month Other recommendations (labs, testing, etc):  none Follow up as an outpatient:  We will arrange for outpatient follow up with Dr. Audie Box  For questions or updates, please contact [facility] Please consult [email or web address] for contact info under        Signed, Buford Dresser, MD  10/14/2019, 8:09 AM

## 2019-10-15 ENCOUNTER — Encounter (HOSPITAL_COMMUNITY): Payer: Self-pay | Admitting: Internal Medicine

## 2019-10-15 LAB — BASIC METABOLIC PANEL
Anion gap: 9 (ref 5–15)
BUN: 6 mg/dL (ref 6–20)
CO2: 23 mmol/L (ref 22–32)
Calcium: 8.6 mg/dL — ABNORMAL LOW (ref 8.9–10.3)
Chloride: 107 mmol/L (ref 98–111)
Creatinine, Ser: 0.88 mg/dL (ref 0.44–1.00)
GFR calc Af Amer: >60 mL/min (ref >60)
GFR calc non Af Amer: >60 mL/min (ref >60)
Glucose, Bld: 86 mg/dL (ref 70–99)
Potassium: 3.9 mmol/L (ref 3.5–5.1)
Sodium: 139 mmol/L (ref 135–145)

## 2019-10-15 MED ORDER — METOPROLOL TARTRATE 25 MG PO TABS: 25.0000 mg | ORAL_TABLET | Freq: Three times a day (TID) | ORAL | 0 refills | Status: DC

## 2019-10-15 MED ORDER — SENNOSIDES-DOCUSATE SODIUM 8.6-50 MG PO TABS: 1.0000 | ORAL_TABLET | Freq: Two times a day (BID) | ORAL | 0 refills | Status: AC

## 2019-10-15 MED ORDER — POLYETHYLENE GLYCOL 3350 17 G PO PACK
17.0000 g | PACK | Freq: Two times a day (BID) | ORAL | Status: DC
  Administered 2019-10-15: 17 g via ORAL
  Filled 2019-10-15: qty 1

## 2019-10-15 MED ORDER — APIXABAN 5 MG PO TABS
5.0000 mg | ORAL_TABLET | Freq: Two times a day (BID) | ORAL | 0 refills | Status: DC
Start: 2019-10-15 — End: 2021-07-31

## 2019-10-15 MED ORDER — LIDOCAINE 4 % EX CREA: TOPICAL_CREAM | Freq: Two times a day (BID) | CUTANEOUS | 0 refills | Status: DC | PRN

## 2019-10-15 MED ORDER — METOPROLOL TARTRATE 25 MG PO TABS: 25.0000 mg | ORAL_TABLET | Freq: Two times a day (BID) | ORAL | 0 refills | Status: DC

## 2019-10-15 MED ORDER — LIDOCAINE 4 % EX CREA
TOPICAL_CREAM | Freq: Two times a day (BID) | CUTANEOUS | Status: DC
  Administered 2019-10-15: 1 via TOPICAL
  Filled 2019-10-15: qty 5

## 2019-10-15 MED ORDER — HYDROCODONE-ACETAMINOPHEN 5-325 MG PO TABS: 1.0000 | ORAL_TABLET | Freq: Four times a day (QID) | ORAL | 0 refills | Status: AC | PRN

## 2019-10-15 MED ORDER — SENNOSIDES-DOCUSATE SODIUM 8.6-50 MG PO TABS
1.0000 | ORAL_TABLET | Freq: Two times a day (BID) | ORAL | Status: DC
  Administered 2019-10-15: 1 via ORAL
  Filled 2019-10-15: qty 1

## 2019-10-15 NOTE — Discharge Summary (Signed)
Discharge Summary  Joyce Franco OTL:[id number] DOB: 10/17/1995  PCP: [name], MD  Admit date: 10/12/2019 Discharge date: 10/15/2019  Time spent: 40 mins  Recommendations for Outpatient Follow-up:  1. PCP in 1 week 2. Cardiology as scheduled  Discharge Diagnoses:  Active Hospital Problems   Diagnosis Date Noted  . Atrial fibrillation with rapid ventricular response (HCC) 10/12/2019  . Contrast media adverse reaction 10/12/2019  . Dehydration 10/12/2019  . Metabolic acidosis 10/12/2019  . Hyperkalemia 10/12/2019  . Mild persistent asthma, uncomplicated 05/25/2019    Resolved Hospital Problems  No resolved problems to display.    Discharge Condition: Stable  Diet recommendation: Regular diet  Vitals:   10/15/19 0748 10/15/19 1151  BP:  118/75  Pulse:  73  Resp:  16  Temp:  98.7 F (37.1 C)  SpO2: 99% 97%    History of present illness:  Joyce K Jonesis a 23 y.o.femalewith medical history significant ofendometriosis, asthma, presented to the ED c/o abdominal pain, nausea and emesis unable to keep anything down. Of note, pt reported she is on her period and typically has severe abdominal pain, but this seems to be much worse (stated most likely due to her missing her last scheduled depot shot). Denies any other new complaints. Pt reports smoking marijuana. In the ED, pt was noted to be in Afib with HR around 150s. Pt was started on diltiazem drip, cardiology consulted. Pt admitted for further management.   Today, pt still reports reproducible chest pain around the site of DCCV, denies any L sided chest pain, SOB, fever/chills. Able to ambulate the hallway w/o any concerns. Pt advised to follow up with PCP and cardiology.   Hospital Course:  Active Problems:   Mild persistent asthma, uncomplicated   Atrial fibrillation with rapid ventricular response (HCC)   Contrast media adverse reaction   Dehydration   Metabolic acidosis   Hyperkalemia   New onset A.  Fib, possibly lone Afib Status post TEE/successful DCCV on 10/13/2019 Currently in sinus rhythm, rate controlled Troponins are unremarkable, EKG no acute ST changes TSH WNL TEE showed no thrombus, negative for PFO, EF of 55 to 60% Cardiology consulted, plan for at least 1 month of anticoagulation given cardioversion, will probably discontinue after 1 month due to low CHA2DS2-VASc, no need for long-term AC Continue Eliquis, metoprolol Follow up with cardiology as outpt for further med adjustment Follow up with PCP  Metabolic acidosis Resolved Likely in the setting of dehydration  Mild persistent asthma Stable  History of endometriosis Follow-up with outpatient OB/GYN         Malnutrition Type:      Malnutrition Characteristics:      Nutrition Interventions:      Estimated body mass index is 24.92 kg/m as calculated from the following:   Height as of this encounter: 5\' 4"  (1.626 m).   Weight as of this encounter: 65.9 kg.    Procedures:  TEE/DCCV on 10/13/19  Consultations:  Cardiology    Discharge Exam: BP 118/75 (BP Location: Left Arm)   Pulse 73   Temp 98.7 F (37.1 C) (Oral)   Resp 16   Ht 5\' 4"  (1.626 m)   Wt 65.9 kg   LMP 10/11/2019 (Exact Date) Comment: poct negative  SpO2 97%   BMI 24.92 kg/m   General: NAD Cardiovascular: S1, S2 present Respiratory: CTAB  Discharge Instructions You were cared for by a hospitalist during your hospital stay. If you have any questions about your discharge medications or the  care you received while you were in the hospital after you are discharged, you can call the unit and asked to speak with the hospitalist on call if the hospitalist that took care of you is not available. Once you are discharged, your primary care physician will handle any further medical issues. Please note that NO REFILLS for any discharge medications will be authorized once you are discharged, as it is imperative that you return to  your primary care physician (or establish a relationship with a primary care physician if you do not have one) for your aftercare needs so that they can reassess your need for medications and monitor your lab values.  Discharge Instructions    Diet - low sodium heart healthy   Complete by: As directed    Increase activity slowly   Complete by: As directed      Allergies as of 10/15/2019      Reactions   Contrast Media [iodinated Diagnostic Agents] Hives, Other (See Comments)   Warm feeling in back of throat 10 minutes after contrast   Ibuprofen Nausea And Vomiting      Medication List    STOP taking these medications   chlorhexidine 0.12 % solution Commonly known as: PERIDEX   levocetirizine 5 MG tablet Commonly known as: XYZAL   ondansetron 4 MG tablet Commonly known as: ZOFRAN     TAKE these medications   albuterol 108 (90 Base) MCG/ACT inhaler Commonly known as: VENTOLIN HFA Inhale 2 puffs into the lungs every 4 (four) hours as needed.   apixaban 5 MG Tabs tablet Commonly known as: ELIQUIS Take 1 tablet (5 mg total) by mouth 2 (two) times daily.   Breo Ellipta 100-25 MCG/INH Aepb Generic drug: fluticasone furoate-vilanterol Inhale 1 puff into the lungs daily.   HYDROcodone-acetaminophen 5-325 MG tablet Commonly known as: NORCO/VICODIN Take 1 tablet by mouth every 6 (six) hours as needed for up to 3 days for moderate pain.   lidocaine 4 % cream Commonly known as: LMX Apply topically 2 (two) times daily as needed. Apply to chest as needed   medroxyPROGESTERone 150 MG/ML injection Commonly known as: DEPO-PROVERA Inject 1 mL (150 mg total) into the muscle once. What changed: when to take this   metoprolol tartrate 25 MG tablet Commonly known as: LOPRESSOR Take 1 tablet (25 mg total) by mouth 2 (two) times daily.   PRENATAL PO Take 1 tablet by mouth daily.   senna-docusate 8.6-50 MG tablet Commonly known as: Senokot-S Take 1 tablet by mouth 2 (two) times  daily for 5 days.      Allergies  Allergen Reactions  . Contrast Media [Iodinated Diagnostic Agents] Hives and Other (See Comments)    Warm feeling in back of throat 10 minutes after contrast  . Ibuprofen Nausea And Vomiting    Follow-up Information    Karleen Dolphin, MD. Schedule an appointment as soon as possible for a visit in 1 week(s).   Specialty: Pediatrics Contact information: [place] [address] [phone]        Geralynn Rile, MD. Schedule an appointment as soon as possible for a visit.   Specialties: Internal Medicine, Cardiology, Radiology Why: Office will call for an appointment, if you dont hear from them in about 7 days, call office for a follow up appointment Contact information: [place] [address] [phone]                The results of significant diagnostics from this  hospitalization (including imaging, microbiology, ancillary and laboratory) are listed below for reference.    Significant Diagnostic Studies: CT Abdomen Pelvis W Contrast  Result Date: 10/12/2019 CLINICAL DATA:  Lower abdominal pain since yesterday. History of endometriosis. EXAM: CT ABDOMEN AND PELVIS WITH CONTRAST TECHNIQUE: Multidetector CT imaging of the abdomen and pelvis was performed using the standard protocol following bolus administration of intravenous contrast. CONTRAST:  OMNIPAQUE IOHEXOL 300 MG/ML  SOLN COMPARISON:  None. FINDINGS: Lower chest: The lung bases are clear of acute process. No pleural effusion or pulmonary lesions. The heart is normal in size. No pericardial effusion. The distal esophagus and aorta are unremarkable. Hepatobiliary: No focal hepatic lesions or intrahepatic biliary dilatation. The gallbladder is normal. No common bile duct dilatation. Pancreas: No mass, inflammation or ductal dilatation. Spleen: Normal size.  No focal lesions. Adrenals/Urinary Tract: The adrenal glands and kidneys are  unremarkable. No renal, ureteral or bladder calculi or mass. No CT findings suspicious for pyelonephritis. Stomach/Bowel: The stomach, duodenum, small bowel and colon are grossly normal without oral contrast. No acute inflammatory changes, mass lesions or obstructive findings. The terminal ileum is normal. The appendix is normal. Vascular/Lymphatic: The aorta is normal in caliber. No dissection. The branch vessels are patent. The major venous structures are patent. No mesenteric or retroperitoneal mass or adenopathy. Small scattered lymph nodes are noted. Reproductive: The uterus and ovaries are unremarkable. Other: No pelvic mass or adenopathy. No free pelvic fluid collections. No inguinal mass or adenopathy. No abdominal wall hernia or subcutaneous lesions. Musculoskeletal: No significant bony findings. IMPRESSION: 1. No acute abdominal/pelvic findings, mass lesions or adenopathy. 2. No renal, ureteral or bladder calculi or mass. Electronically Signed   By: Rudie Meyer M.D.   On: 10/12/2019 17:23   ECHO TEE  Result Date: 10/13/2019    TRANSESOPHOGEAL ECHO REPORT   Patient Name:   LOVA URBIETA Date of Exam: 10/13/2019 Medical Rec #:  [id number]     Height:       64.0 in Accession #:    [id number]    Weight:       146.4 lb Date of Birth:  Feb 24, 1996     BSA:          1.713 m Patient Age:    23 years      BP:           124/105 mmHg Patient Gender: F             HR:           106 bpm. Exam Location:  Inpatient Procedure: Transesophageal Echo, Cardiac Doppler and Color Doppler Indications:     I48.0 Paroxysmal atrial fibrillation  History:         Patient has no prior history of Echocardiogram examinations.  Sonographer:     Tiffany Dance Referring Phys:  [id number] N UUVO Diagnosing Phys: Zoila Shutter MD PROCEDURE: The transesophogeal probe was passed without difficulty through the esophogus of the patient. Local oropharyngeal anesthetic was provided with Cetacaine. Sedation performed by different physician. The  patient was monitored while under deep sedation. Anesthestetic sedation was provided intravenously by Anesthesiology: 171.39mg  of Propofol, 60mg  of Lidocaine. The patient developed no complications during the procedure. IMPRESSIONS  1. Left ventricular ejection fraction, by estimation, is 60 to 65%. The left ventricle has normal function. The left ventricle has no regional wall motion abnormalities.  2. Right ventricular systolic function is normal. The right ventricular size is normal.  3. No left atrial/left atrial  appendage thrombus was detected.  4. The mitral valve is grossly normal. Trivial mitral valve regurgitation.  5. The aortic valve is tricuspid. Aortic valve regurgitation is not visualized. Conclusion(s)/Recommendation(s): No LA/LAA thrombus identified. Successful cardioversion performed with restoration of normal sinus rhythm. FINDINGS  Left Ventricle: Left ventricular ejection fraction, by estimation, is 60 to 65%. The left ventricle has normal function. The left ventricle has no regional wall motion abnormalities. The left ventricular internal cavity size was normal in size. There is  no left ventricular hypertrophy. Right Ventricle: The right ventricular size is normal. No increase in right ventricular wall thickness. Right ventricular systolic function is normal. Left Atrium: Left atrial size was normal in size. No left atrial/left atrial appendage thrombus was detected. Right Atrium: Right atrial size was normal in size. Pericardium: There is no evidence of pericardial effusion. Mitral Valve: The mitral valve is grossly normal. Trivial mitral valve regurgitation. Tricuspid Valve: The tricuspid valve is normal in structure. Tricuspid valve regurgitation is not demonstrated. Aortic Valve: The aortic valve is tricuspid. Aortic valve regurgitation is not visualized. Pulmonic Valve: The pulmonic valve was normal in structure. Pulmonic valve regurgitation is not visualized. Aorta: The aortic root and  ascending aorta are structurally normal, with no evidence of dilitation. IAS/Shunts: No atrial level shunt detected by color flow Doppler. Zoila Shutter MD Electronically signed by Zoila Shutter MD Signature Date/Time: 10/13/2019/3:50:15 PM    Final     Microbiology: Recent Results (from the past 240 hour(s))  SARS Coronavirus 2 by RT PCR (hospital order, performed in [facility] hospital lab) Nasopharyngeal Nasopharyngeal Swab     Status: None   Collection Time: 10/12/19  3:22 PM   Specimen: Nasopharyngeal Swab  Result Value Ref Range Status   SARS Coronavirus 2 NEGATIVE NEGATIVE Final    Comment: (NOTE) SARS-CoV-2 target nucleic acids are NOT DETECTED.  The SARS-CoV-2 RNA is generally detectable in upper and lower respiratory specimens during the acute phase of infection. The lowest concentration of SARS-CoV-2 viral copies this assay can detect is 250 copies / mL. A negative result does not preclude SARS-CoV-2 infection and should not be used as the sole basis for treatment or other patient management decisions.  A negative result may occur with improper specimen collection / handling, submission of specimen other than nasopharyngeal swab, presence of viral mutation(s) within the areas targeted by this assay, and inadequate number of viral copies (<250 copies / mL). A negative result must be combined with clinical observations, patient history, and epidemiological information.  Fact Sheet for Patients:   BoilerBrush.com.cy  Fact Sheet for Healthcare Providers: https://pope.com/  This test is not yet approved or  cleared by the [place] FDA and has been authorized for detection and/or diagnosis of SARS-CoV-2 by FDA under an Emergency Use Authorization (EUA).  This EUA will remain in effect (meaning this test can be used) for the duration of the COVID-19 declaration under Section 564(b)(1) of the Act, 21 U.S.C. section  360bbb-3(b)(1), unless the authorization is terminated or revoked sooner.  Performed at [facility], [address], [place], [place] [address]      Labs: Basic Metabolic Panel: Recent Labs  Lab 10/12/19 1155 10/13/19 0107 10/13/19 0539 10/14/19 0657 10/15/19 0843  NA 140  --  140 138 139  K 5.2*  --  4.4 3.4* 3.9  CL 104  --  110 110 107  CO2 18*  --  21* 22 23  GLUCOSE 129*  --  114* 86 86  BUN 11  --  7 13 6   CREATININE 0.97  --  0.75 0.80 0.88  CALCIUM 9.6  --  8.2* 7.9* 8.6*  MG  --  1.8  --   --   --   PHOS  --  2.6  --   --   --    Liver Function Tests: Recent Labs  Lab 10/12/19 1155  AST 32  ALT 21  ALKPHOS 47  BILITOT 1.6*  PROT 7.5  ALBUMIN 4.4   Recent Labs  Lab 10/12/19 1155  LIPASE 22   No results for input(s): AMMONIA in the last 168 hours. CBC: Recent Labs  Lab 10/12/19 1155 10/13/19 0107 10/14/19 0657  WBC 8.0 8.4 10.0  NEUTROABS 5.5 7.4  --   HGB 15.3* 13.7 11.5*  HCT 44.9 39.2 33.2*  MCV 83.5 81.2 82.6  PLT 307 274 224   Cardiac Enzymes: Recent Labs  Lab 10/13/19 0107  CKTOTAL 48   BNP: BNP (last 3 results) No results for input(s): BNP in the last 8760 hours.  ProBNP (last 3 results) No results for input(s): PROBNP in the last 8760 hours.  CBG: No results for input(s): GLUCAP in the last 168 hours.     Signed:  Briant CedarNkeiruka J Juanda Luba, MD [facility] 10/15/2019, 2:09 PM

## 2019-10-16 ENCOUNTER — Telehealth: Payer: Self-pay | Admitting: Cardiovascular Disease

## 2019-10-16 NOTE — Telephone Encounter (Signed)
I attempted to contact the patient on 10/16/19 to schedule follow up visit with Dr.O'Neal from a staff message that was received. The patient didn't answer so I left message for the patient to return call to get that appointment scheduled.

## 2019-10-29 NOTE — Progress Notes (Signed)
Cardiology Office Note   Date:  10/30/2019   ID:  Joyce Franco, Joyce Franco 01/16/1996, MRN [id number]  PCP:  Karleen Dolphin, MD  Cardiologist:  Dr. Audie Box  No chief complaint on file.  History of Present Illness: Joyce Franco is a 24 y.o. female who presents for posthospitalization follow-up after being admitted for recurrent chest pain and irregular heart rhythm.  She was found to be in atrial fibrillation with RVR, and underwent TEE cardioversion on 10/13/2019.  She was placed on apixaban 5 mg for 1 month and then discontinue.  Other history includes persistent asthma, endometriosis, with significant abdominal pain in this setting.  On discharge dated 10/15/2019 she continued to have reproducible chest discomfort on palpation.  The patient was sent home in normal sinus rhythm, and continued on metoprolol 25 mg daily along with anticoagulation therapy.  She is here for follow-up.  She is here today complaining of fatigue, some anxiety concerning return of irregular heart rate, some intermittent chest discomfort with heartburn symptoms.  She has occasional dyspnea on exertion.  She has significantly cut back on her activities as she is anxious that atrophic could recur along with the fact that she is feeling so tired.  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   Past Medical History:  Diagnosis Date  . Asthma   . Endometriosis, uterus   . Fractured pelvis ([place])   . Medical history non-contributory   . Urticaria     Past Surgical History:  Procedure Laterality Date  . ABLATION ON ENDOMETRIOSIS  11/08/2014   Procedure: ABLATION ON ENDOMETRIOSIS;  Surgeon: Thurnell Lose, MD;  Location: [facility];  Service: Gynecology;;  . CARDIOVERSION N/A 10/13/2019   Procedure: CARDIOVERSION;  Surgeon: Pixie Casino, MD;  Location: [facility];  Service: Cardiovascular;  Laterality: N/A;  . CHROMOPERTUBATION  11/08/2014   Procedure: CHROMOPERTUBATION;  Surgeon: Thurnell Lose, MD;  Location: [facility];   Service: Gynecology;;  . FOOT SURGERY    . LAPAROSCOPY N/A 11/08/2014   Procedure: LAPAROSCOPY DIAGNOSTIC with peritoneal  biopsy;  Surgeon: Thurnell Lose, MD;  Location: [facility];  Service: Gynecology;  Laterality: N/A;  . TEE WITHOUT CARDIOVERSION N/A 10/13/2019   Procedure: TRANSESOPHAGEAL ECHOCARDIOGRAM (TEE);  Surgeon: Pixie Casino, MD;  Location: [facility] ENDOSCOPY;  Service: Cardiovascular;  Laterality: N/A;     Current Outpatient Medications  Medication Sig Dispense Refill  . apixaban (ELIQUIS) 5 MG TABS tablet Take 1 tablet (5 mg total) by mouth 2 (two) times daily. 60 tablet 0  . fluticasone furoate-vilanterol (BREO ELLIPTA) 100-25 MCG/INH AEPB Inhale 1 puff into the lungs daily. 60 each 5  . medroxyPROGESTERone (DEPO-PROVERA) 150 MG/ML injection Inject 1 mL (150 mg total) into the muscle once. 1 mL 2  . Prenatal Vit-Fe Fumarate-FA (PRENATAL PO) Take 1 tablet by mouth daily.    . metoprolol succinate (TOPROL XL) 25 MG 24 hr tablet Take 1 tablet (25 mg total) by mouth at bedtime. 90 tablet 3   No current facility-administered medications for this visit.    Allergies:   Contrast media [iodinated diagnostic agents] and Ibuprofen    Social History:  The patient  reports that she is a non-smoker but has been exposed to tobacco smoke. She has never used smokeless tobacco. She reports that she does not drink alcohol and does not use drugs.   Family History:  The patient's family history includes Healthy in her father and mother.    ROS: All other systems are  reviewed and negative. Unless otherwise mentioned in H&P    PHYSICAL EXAM: VS:  BP 100/70   Pulse 88   Wt 142 lb 3.2 oz (64.5 kg)   LMP 10/11/2019 (Exact Date) Comment: poct negative  SpO2 99%   BMI 24.41 kg/m  , BMI Body mass index is 24.41 kg/m. GEN: Well nourished, well developed, in no acute distress HEENT: normal Neck: no JVD, carotid bruits, or masses Cardiac: RRR; no murmurs, rubs, or gallops,no edema  Respiratory:   Clear to auscultation bilaterally, normal work of breathing GI: soft, nontender, nondistended, + BS MS: no deformity or atrophy Skin: warm and dry, no rash Neuro:  Strength and sensation are intact Psych: euthymic mood, full affect   EKG: Normal sinus rhythm with some mild right atrial enlargement, early repolarization abnormality heart rate of 88 bpm.  Recent Labs: 10/12/2019: ALT 21; TSH 0.693 10/13/2019: Magnesium 1.8 10/14/2019: Hemoglobin 11.5; Platelets 224 10/15/2019: BUN 6; Creatinine, Ser 0.88; Potassium 3.9; Sodium 139    Lipid Panel No results found for: CHOL, TRIG, HDL, CHOLHDL, VLDL, LDLCALC, LDLDIRECT    Wt Readings from Last 3 Encounters:  10/30/19 142 lb 3.2 oz (64.5 kg)  10/15/19 145 lb 3.2 oz (65.9 kg)  05/25/19 144 lb 12.8 oz (65.7 kg)      Other studies Reviewed: TEE-CV 10/13/19 1. Left ventricular ejection fraction, by estimation, is 60 to 65%. The  left ventricle has normal function. The left ventricle has no regional  wall motion abnormalities.  2. Right ventricular systolic function is normal. The right ventricular  size is normal.  3. No left atrial/left atrial appendage thrombus was detected.  4. The mitral valve is grossly normal. Trivial mitral valve  regurgitation.  5. The aortic valve is tricuspid. Aortic valve regurgitation is not  visualized.   Conclusion(s)/Recommendation(s): No LA/LAA thrombus identified. Successful  cardioversion performed with restoration of normal sinus rhythm.   ASSESSMENT AND PLAN:  1.  PAF: She has not had any recurrence of atrial fibrillation that she is aware of.  She has had some bleeding gums on Eliquis which is beginning to subside.  Although not completely eliminated.  EKG reveals normal sinus rhythm.  I have answered several questions concerning atrial fibrillation.  Due to her fatigue I am going to take her off metoprolol tartrate 25 mg twice daily and change her to metoprolol succinate 12.5 mg at at  bedtime.  Her blood pressure is soft at 100/70.  Hopefully this will help her to feel better without so much fatigue.  I have given her some samples of Eliquis 5 mg twice daily.  She will follow-up with Dr. Flora Lipps in the next 2 to 3 weeks for further assessment and decision concerning stopping anticoagulation therapy  I have reviewed her echocardiogram revealing normal LV systolic function with normal right and left atrial size.  She is advised to avoid caffeine or any other stimulants which can exacerbate elevated heart rate.  2.  Anxiety over health: She is anxious about going back to normal activities as she is uncertain if this would happen again.  I have advised her to go back to her normal activities without fear as hopefully this would have been a single occurrence.  However if this does occur again we have several options which can be helpful to her.  She will follow-up with her PCP and Dr. Flora Lipps.  3.  History of endometriosis: She is to follow-up with OB/GYN specialist concerning this.  She apparently has had a  endometrial ablation in the past.  Recommend continuing with the specialist for further treatment.  I have explained as she is on anticoagulant, she may notice that her periods are little heavier.  She verbalizes understanding.  I did review labs on 10/14/2019, she was not found to be significantly anemic with a hemoglobin 11.5 and hematocrit of 33.2.  Platelets were normal.   Current medicines are reviewed at length with the patient today.  I have spent 30 minutes dedicated to the care of this patient on the date of this encounter to include pre-visit review of records, assessment, management and diagnostic testing,with shared decision making.  Labs/ tests ordered today include: None Joyce Franco, ANP, AACC   10/30/2019 12:41 PM    [facility] [address] Office [phone] Fax [phone]  Notice: This dictation was prepared  with Dragon dictation along with smaller phrase technology. Any transcriptional errors that result from this process are unintentional and may not be corrected upon review.

## 2019-10-30 ENCOUNTER — Ambulatory Visit (INDEPENDENT_AMBULATORY_CARE_PROVIDER_SITE_OTHER): Payer: Federal, State, Local not specified - PPO | Admitting: Adult Health

## 2019-10-30 ENCOUNTER — Encounter: Payer: Self-pay | Admitting: Adult Health

## 2019-10-30 ENCOUNTER — Other Ambulatory Visit: Payer: Self-pay

## 2019-10-30 VITALS — BP 100/70 | HR 88 | Wt 142.2 lb

## 2019-10-30 DIAGNOSIS — I4891 Unspecified atrial fibrillation: Secondary | ICD-10-CM | POA: Diagnosis not present

## 2019-10-30 DIAGNOSIS — F418 Other specified anxiety disorders: Secondary | ICD-10-CM | POA: Diagnosis not present

## 2019-10-30 DIAGNOSIS — N809 Endometriosis, unspecified: Secondary | ICD-10-CM

## 2019-10-30 MED ORDER — METOPROLOL SUCCINATE ER 25 MG PO TB24
25.0000 mg | ORAL_TABLET | Freq: Every day | ORAL | 3 refills | Status: DC
Start: 2019-10-30 — End: 2021-07-31

## 2019-10-30 NOTE — Patient Instructions (Signed)
Medication Instructions:  STOP- Metoprolol Tartrate START- Metoprolol Succinate 25 mg by mouth at bedtime  *If you need a refill on your cardiac medications before your next appointment, please call your pharmacy*   Lab Work: None Ordered   Testing/Procedures: None Ordered   Follow-Up: At [facility], you and your health needs are our priority.  As part of our continuing mission to provide you with exceptional heart care, we have created designated Provider Care Teams.  These Care Teams include your primary Cardiologist (physician) and Advanced Practice Providers (APPs -  Physician Assistants and Nurse Practitioners) who all work together to provide you with the care you need, when you need it.  We recommend signing up for the patient portal called "MyChart".  Sign up information is provided on this After Visit Summary.  MyChart is used to connect with patients for Virtual Visits (Telemedicine).  Patients are able to view lab/test results, encounter notes, upcoming appointments, etc.  Non-urgent messages can be sent to your provider as well.   To learn more about what you can do with MyChart, go to [email or web address].    Your next appointment:   2-3 week(s)  The format for your next appointment:   In Person  Provider:   Lennie Odor, MD

## 2019-11-01 ENCOUNTER — Other Ambulatory Visit: Payer: Self-pay

## 2019-11-01 ENCOUNTER — Encounter (HOSPITAL_COMMUNITY): Payer: Self-pay | Admitting: Emergency Medicine

## 2019-11-01 ENCOUNTER — Ambulatory Visit (HOSPITAL_COMMUNITY)
Admission: EM | Admit: 2019-11-01 | Discharge: 2019-11-01 | Disposition: A | Discharge status: Home / Self Care | Payer: Federal, State, Local not specified - PPO | Attending: Urgent Care | Admitting: Urgent Care

## 2019-11-01 DIAGNOSIS — K068 Other specified disorders of gingiva and edentulous alveolar ridge: Secondary | ICD-10-CM

## 2019-11-01 DIAGNOSIS — R519 Headache, unspecified: Secondary | ICD-10-CM

## 2019-11-01 DIAGNOSIS — K0889 Other specified disorders of teeth and supporting structures: Secondary | ICD-10-CM

## 2019-11-01 MED ORDER — AMOXICILLIN-POT CLAVULANATE 875-125 MG PO TABS: 1.0000 | ORAL_TABLET | Freq: Two times a day (BID) | ORAL | 0 refills | Status: DC

## 2019-11-01 MED ORDER — TRAMADOL HCL 50 MG PO TABS: 50.0000 mg | ORAL_TABLET | Freq: Four times a day (QID) | ORAL | 0 refills | Status: DC | PRN

## 2019-11-01 MED ORDER — CHLORHEXIDINE GLUCONATE 0.12 % MT SOLN
OROMUCOSAL | 0 refills | Status: DC
Start: 2019-11-01 — End: 2020-05-11

## 2019-11-01 MED ORDER — LIDOCAINE VISCOUS HCL 2 % MT SOLN
OROMUCOSAL | 0 refills | Status: DC
Start: 2019-11-01 — End: 2020-05-11

## 2019-11-01 NOTE — Discharge Instructions (Addendum)
Please schedule Tylenol at 500 mg - 650 mg once every 6 hours as needed for aches and pains.  If you still have pain despite taking Tylenol regularly, this is breakthrough pain.  You can use tramadol once every 6 hours for this.  Once your pain is better controlled, switch back to just Tylenol.    [facility] [phone] extension [phone]  Dr. Civils [phone] [address]  [facility] [phone] [address]  [facility] [phone] extension [phone] [place], [place], [address]   First come first serve for the first 10 clients.  May do simple extractions only, no wisdom teeth or surgery.  You may try the second for Thursday of the month starting at 6:30 AM.  [employer] You may call the school to see if they are still helping to provide dental care for emergent cases.  

## 2019-11-01 NOTE — ED Provider Notes (Signed)
[facility]   MRN: [id number] DOB: 01-Jan-1996  Subjective:   Joyce Franco is a 24 y.o. female presenting for 1 week history of persistent and worsening bilateral pain over the back of her gums on the lower part of her mouth with radiation of the pain into her jaw.  Has had more difficulty chewing as a result.  Of note, patient had recent cardioversion for atrial fibrillation and is currently on Eliquis.  She has had some gum bleeding.  Denies history of severe dental issues.  She has kept up with her dental care for the most part.  Had wisdom tooth extraction many years ago.  No current facility-administered medications for this encounter.  Current Outpatient Medications:  .  apixaban (ELIQUIS) 5 MG TABS tablet, Take 1 tablet (5 mg total) by mouth 2 (two) times daily., Disp: 60 tablet, Rfl: 0 .  fluticasone furoate-vilanterol (BREO ELLIPTA) 100-25 MCG/INH AEPB, Inhale 1 puff into the lungs daily., Disp: 60 each, Rfl: 5 .  medroxyPROGESTERone (DEPO-PROVERA) 150 MG/ML injection, Inject 1 mL (150 mg total) into the muscle once., Disp: 1 mL, Rfl: 2 .  metoprolol succinate (TOPROL XL) 25 MG 24 hr tablet, Take 1 tablet (25 mg total) by mouth at bedtime., Disp: 90 tablet, Rfl: 3 .  Prenatal Vit-Fe Fumarate-FA (PRENATAL PO), Take 1 tablet by mouth daily., Disp: , Rfl:    Allergies  Allergen Reactions  . Contrast Media [Iodinated Diagnostic Agents] Hives and Other (See Comments)    Warm feeling in back of throat 10 minutes after contrast  . Ibuprofen Nausea And Vomiting    Past Medical History:  Diagnosis Date  . Asthma   . Endometriosis, uterus   . Fractured pelvis ([facility])   . Medical history non-contributory   . Urticaria      Past Surgical History:  Procedure Laterality Date  . ABLATION ON ENDOMETRIOSIS  11/08/2014   Procedure: ABLATION ON ENDOMETRIOSIS;  Surgeon: Geryl Rankins, MD;  Location: [facility];  Service: Gynecology;;  . CARDIOVERSION N/A 10/13/2019   Procedure:  CARDIOVERSION;  Surgeon: Chrystie Nose, MD;  Location: [facility] ENDOSCOPY;  Service: Cardiovascular;  Laterality: N/A;  . CHROMOPERTUBATION  11/08/2014   Procedure: CHROMOPERTUBATION;  Surgeon: Geryl Rankins, MD;  Location: [facility];  Service: Gynecology;;  . FOOT SURGERY    . LAPAROSCOPY N/A 11/08/2014   Procedure: LAPAROSCOPY DIAGNOSTIC with peritoneal  biopsy;  Surgeon: Geryl Rankins, MD;  Location: [facility];  Service: Gynecology;  Laterality: N/A;  . TEE WITHOUT CARDIOVERSION N/A 10/13/2019   Procedure: TRANSESOPHAGEAL ECHOCARDIOGRAM (TEE);  Surgeon: Chrystie Nose, MD;  Location: [facility]COPY;  Service: Cardiovascular;  Laterality: N/A;    Family History  Problem Relation Age of Onset  . Healthy Mother   . Healthy Father     Social History   Tobacco Use  . Smoking status: Passive Smoke Exposure - Never Smoker  . Smokeless tobacco: Never Used  . Tobacco comment: boyfriend smokes inside his home  Vaping Use  . Vaping Use: Never used  Substance Use Topics  . Alcohol use: No  . Drug use: No    ROS   Objective:   Vitals: BP 126/79 (BP Location: Right Arm)   Pulse 97   Temp 100.1 F (37.8 C) (Oral)   Resp 18   LMP 10/11/2019 (Exact Date) Comment: poct negative  SpO2 96%   Physical Exam Constitutional:      General: She is not in acute distress.    Appearance: Normal appearance. She  is well-developed. She is not ill-appearing, toxic-appearing or diaphoretic.  HENT:     Head: Normocephalic and atraumatic.     Nose: Nose normal.     Mouth/Throat:     Mouth: Mucous membranes are moist. No injury or oral lesions.     Dentition: Does not have dentures. Gingival swelling present. No dental tenderness, dental caries, dental abscesses or gum lesions.     Pharynx: Oropharynx is clear.   Eyes:     General: No scleral icterus.    Extraocular Movements: Extraocular movements intact.     Pupils: Pupils are equal, round, and reactive to light.  Cardiovascular:     Rate and Rhythm:  Normal rate.  Pulmonary:     Effort: Pulmonary effort is normal.  Skin:    General: Skin is warm and dry.  Neurological:     General: No focal deficit present.     Mental Status: She is alert and oriented to person, place, and time.  Psychiatric:        Mood and Affect: Mood normal.        Behavior: Behavior normal.      Assessment and Plan :   I have reviewed the PDMP during this encounter.  1. Pain, dental   2. Pain in gums   3. Facial pain     Given patient's pain and fever, recommended starting amoxicillin for possible developing dental infection.  Use chlorhexidine rinse, lidocaine solution and for pain schedule Tylenol and use tramadol for breakthrough pain.  Emphasized need to follow-up with her dentist, also provide her with information to other dental practices. Counseled patient on potential for adverse effects with medications prescribed/recommended today, ER and return-to-clinic precautions discussed, patient verbalized understanding.    Wallis Bamberg, [place] 11/03/19 [phone]

## 2019-11-01 NOTE — ED Triage Notes (Signed)
Pt here for pain and swelling in back gums; pt had recent cardioversion for afib and is taking eliquis

## 2019-11-18 ENCOUNTER — Emergency Department (HOSPITAL_COMMUNITY)
Admission: EM | Admit: 2019-11-18 | Discharge: 2019-11-18 | Disposition: A | Discharge status: Home / Self Care | Payer: Federal, State, Local not specified - PPO | Attending: Emergency Medicine | Admitting: Emergency Medicine

## 2019-11-18 ENCOUNTER — Encounter (HOSPITAL_COMMUNITY): Payer: Self-pay

## 2019-11-18 ENCOUNTER — Telehealth: Payer: Self-pay | Admitting: Physician Assistant

## 2019-11-18 ENCOUNTER — Other Ambulatory Visit: Payer: Self-pay

## 2019-11-18 ENCOUNTER — Emergency Department (HOSPITAL_COMMUNITY): Payer: Federal, State, Local not specified - PPO

## 2019-11-18 DIAGNOSIS — Z5321 Procedure and treatment not carried out due to patient leaving prior to being seen by health care provider: Secondary | ICD-10-CM | POA: Insufficient documentation

## 2019-11-18 DIAGNOSIS — R55 Syncope and collapse: Secondary | ICD-10-CM | POA: Insufficient documentation

## 2019-11-18 DIAGNOSIS — Z7901 Long term (current) use of anticoagulants: Secondary | ICD-10-CM | POA: Insufficient documentation

## 2019-11-18 DIAGNOSIS — Y939 Activity, unspecified: Secondary | ICD-10-CM | POA: Insufficient documentation

## 2019-11-18 DIAGNOSIS — Y929 Unspecified place or not applicable: Secondary | ICD-10-CM | POA: Diagnosis not present

## 2019-11-18 DIAGNOSIS — S0990XA Unspecified injury of head, initial encounter: Secondary | ICD-10-CM | POA: Diagnosis not present

## 2019-11-18 DIAGNOSIS — Y999 Unspecified external cause status: Secondary | ICD-10-CM | POA: Insufficient documentation

## 2019-11-18 DIAGNOSIS — W0110XA Fall on same level from slipping, tripping and stumbling with subsequent striking against unspecified object, initial encounter: Secondary | ICD-10-CM | POA: Insufficient documentation

## 2019-11-18 HISTORY — DX: Unspecified atrial fibrillation: I48.91

## 2019-11-18 LAB — CBC
HCT: 35.4 % — ABNORMAL LOW (ref 36.0–46.0)
Hemoglobin: 12.2 g/dL (ref 12.0–15.0)
MCH: 28.3 pg (ref 26.0–34.0)
MCHC: 34.5 g/dL (ref 30.0–36.0)
MCV: 82.1 fL (ref 80.0–100.0)
Platelets: 262 10*3/uL (ref 150–400)
RBC: 4.31 MIL/uL (ref 3.87–5.11)
RDW: 13.5 % (ref 11.5–15.5)
WBC: 10.7 10*3/uL — ABNORMAL HIGH (ref 4.0–10.5)
nRBC: 0 % (ref 0.0–0.2)

## 2019-11-18 LAB — BASIC METABOLIC PANEL
Anion gap: 8 (ref 5–15)
BUN: 9 mg/dL (ref 6–20)
CO2: 25 mmol/L (ref 22–32)
Calcium: 8.9 mg/dL (ref 8.9–10.3)
Chloride: 107 mmol/L (ref 98–111)
Creatinine, Ser: 0.95 mg/dL (ref 0.44–1.00)
GFR calc Af Amer: >60 mL/min (ref >60)
GFR calc non Af Amer: >60 mL/min (ref >60)
Glucose, Bld: 97 mg/dL (ref 70–99)
Potassium: 4.1 mmol/L (ref 3.5–5.1)
Sodium: 140 mmol/L (ref 135–145)

## 2019-11-18 LAB — I-STAT BETA HCG BLOOD, ED (MC, WL, AP ONLY): I-stat hCG, quantitative: <5 m[IU]/mL (ref <5)

## 2019-11-18 MED ORDER — SODIUM CHLORIDE 0.9% FLUSH: 3.0000 mL | Freq: Once | INTRAVENOUS | Status: DC

## 2019-11-18 NOTE — ED Provider Notes (Signed)
Called by nursing staff for  Verbal order for a CT of the head. Patient is a 24 year old female on Eliquis, who tripped, fell, hit her head and lost consciousness. Okay to order CT head.   Mare Ferrari, PA-C 11/18/19 1827    Geoffery Lyons, MD 11/18/19 2041

## 2019-11-18 NOTE — Telephone Encounter (Signed)
24 yo female who is on Eliquis for PAF, she tripped and home and face planted on to the floor. After the event, she had a bump on her forehead and feels a little stomach tenderness. I discussed the case with Dr. Mayford Knife, even though she is young and she has no further dizziness or headache, we are still concerned of any kind of brain trauma on the Eliquis. She has been advised to go the ED for potential CT of head.   Given her young age, would like to see her coming off of Eliquis after evaluated by Dr. Flora Lipps

## 2019-11-18 NOTE — ED Triage Notes (Signed)
Onset 1 hour PTA pt tripped over cord, fell forward on face, lost consciousness briefly.  Indentation on mid forehead and c/o left rib pain.  Denies N/V.

## 2019-11-18 NOTE — ED Triage Notes (Signed)
Ok to order CT head per Trudee Grip, PA.

## 2019-11-18 NOTE — ED Notes (Signed)
Pt did not respond when called for room, moved back to waiting

## 2019-11-18 NOTE — ED Notes (Signed)
Called for pt in the lobby and outside, no answer. Pt is not visualized in the lobby or outside at this time.

## 2019-11-22 ENCOUNTER — Ambulatory Visit: Payer: Federal, State, Local not specified - PPO | Admitting: Cardiovascular Disease

## 2019-11-22 NOTE — Progress Notes (Deleted)
Cardiology Office Note:   Date:  11/22/2019  NAME:  Joyce Franco    MRN: [id number] DOB:  1995-07-24   PCP:  [name], MD  Cardiologist:  No primary care provider on file.  Electrophysiologist:  None   Referring MD: [name], MD   No chief complaint on file. ***  History of Present Illness:   Joyce Franco is a 24 y.o. female with a hx of endometriosis and atrial fibrillation who presents for follow-up. She was diagnosed with atrial fibrillation in June of this year in the setting of nausea and vomiting/abdominal illness. Echo was normal. Thyroid studies were normal.   Problem List 1. Endometriosis 2. Atrial fibrillation, persistent 10/2019 -CHADSVASC=1 -TEE/DCCV 10/13/2019 -normal echo  Past Medical History: Past Medical History:  Diagnosis Date   Asthma    Atrial fibrillation ([facility])    Endometriosis, uterus    Fractured pelvis ([facility])    Medical history non-contributory    Urticaria     Past Surgical History: Past Surgical History:  Procedure Laterality Date   ABLATION ON ENDOMETRIOSIS  11/08/2014   Procedure: ABLATION ON ENDOMETRIOSIS;  Surgeon: Geryl Rankins, MD;  Location: [facility];  Service: Gynecology;;   CARDIOVERSION N/A 10/13/2019   Procedure: CARDIOVERSION;  Surgeon: Chrystie Nose, MD;  Location: [facility] ENDOSCOPY;  Service: Cardiovascular;  Laterality: N/A;   CHROMOPERTUBATION  11/08/2014   Procedure: CHROMOPERTUBATION;  Surgeon: Geryl Rankins, MD;  Location: [facility];  Service: Gynecology;;   FOOT SURGERY     LAPAROSCOPY N/A 11/08/2014   Procedure: LAPAROSCOPY DIAGNOSTIC with peritoneal  biopsy;  Surgeon: Geryl Rankins, MD;  Location: [facility];  Service: Gynecology;  Laterality: N/A;   TEE WITHOUT CARDIOVERSION N/A 10/13/2019   Procedure: TRANSESOPHAGEAL ECHOCARDIOGRAM (TEE);  Surgeon: Chrystie Nose, MD;  Location: [facility] ENDOSCOPY;  Service: Cardiovascular;  Laterality: N/A;    Current Medications: No outpatient medications have been marked as  taking for the 11/22/19 encounter (Appointment) with O'Neal, Ronnald Ramp, MD.     Allergies:    Contrast media [iodinated diagnostic agents] and Ibuprofen   Social History: Social History   Socioeconomic History   Marital status: Single    Spouse name: Not on file   Number of children: Not on file   Years of education: Not on file   Highest education level: Not on file  Occupational History   Not on file  Tobacco Use   Smoking status: Passive Smoke Exposure - Never Smoker   Smokeless tobacco: Never Used   Tobacco comment: boyfriend smokes inside his home  Vaping Use   Vaping Use: Never used  Substance and Sexual Activity   Alcohol use: No   Drug use: No   Sexual activity: Yes  Other Topics Concern   Not on file  Social History Narrative   Not on file   Social Determinants of Health   Financial Resource Strain:    Difficulty of Paying Living Expenses:   Food Insecurity:    Worried About Programme researcher, broadcasting/film/video in the Last Year:    Barista in the Last Year:   Transportation Needs:    Freight forwarder (Medical):    Lack of Transportation (Non-Medical):   Physical Activity:    Days of Exercise per Week:    Minutes of Exercise per Session:   Stress:    Feeling of Stress :   Social Connections:    Frequency of Communication with Friends and Family:    Frequency of Social Gatherings  with Friends and Family:    Attends Religious Services:    Active Member of Clubs or Organizations:    Attends Banker Meetings:    Marital Status:      Family History: The patient's ***family history includes Healthy in her father and mother.  ROS:   All other ROS reviewed and negative. Pertinent positives noted in the HPI.     EKGs/Labs/Other Studies Reviewed:   The following studies were personally reviewed by me today:  EKG:  EKG is *** ordered today.  The ekg ordered today demonstrates ***, and was personally reviewed by me.    TTE 10/13/2019 1. Left ventricular ejection fraction, by estimation, is 60 to 65%. The  left ventricle has normal function. The left ventricle has no regional  wall motion abnormalities.  2. Right ventricular systolic function is normal. The right ventricular  size is normal.  3. No left atrial/left atrial appendage thrombus was detected.  4. The mitral valve is grossly normal. Trivial mitral valve  regurgitation.  5. The aortic valve is tricuspid. Aortic valve regurgitation is not  visualized.   Recent Labs: 10/12/2019: ALT 21; TSH 0.693 10/13/2019: Magnesium 1.8 11/18/2019: BUN 9; Creatinine, Ser 0.95; Hemoglobin 12.2; Platelets 262; Potassium 4.1; Sodium 140   Recent Lipid Panel No results found for: CHOL, TRIG, HDL, CHOLHDL, VLDL, LDLCALC, LDLDIRECT  Physical Exam:   VS:  There were no vitals taken for this visit.   Wt Readings from Last 3 Encounters:  11/18/19 135 lb (61.2 kg)  10/30/19 142 lb 3.2 oz (64.5 kg)  10/15/19 145 lb 3.2 oz (65.9 kg)    General: Well nourished, well developed, in no acute distress Heart: Atraumatic, normal size  Eyes: PEERLA, EOMI  Neck: Supple, no JVD Endocrine: No thryomegaly Cardiac: Normal S1, S2; RRR; no murmurs, rubs, or gallops Lungs: Clear to auscultation bilaterally, no wheezing, rhonchi or rales  Abd: Soft, nontender, no hepatomegaly  Ext: No edema, pulses 2+ Musculoskeletal: No deformities, BUE and BLE strength normal and equal Skin: Warm and dry, no rashes   Neuro: Alert and oriented to person, place, time, and situation, CNII-XII grossly intact, no focal deficits  Psych: Normal mood and affect   ASSESSMENT:   Joyce Franco is a 24 y.o. female who presents for the following: No diagnosis found.  PLAN:   There are no diagnoses linked to this encounter.  Disposition: No follow-ups on file.  Medication Adjustments/Labs and Tests Ordered: Current medicines are reviewed at length with the patient today.  Concerns regarding  medicines are outlined above.  No orders of the defined types were placed in this encounter.  No orders of the defined types were placed in this encounter.   There are no Patient Instructions on file for this visit.   Time Spent with Patient: I have spent a total of *** minutes with patient reviewing hospital notes, telemetry, EKGs, labs and examining the patient as well as establishing an assessment and plan that was discussed with the patient.  > 50% of time was spent in direct patient care.  Signed, Lenna Gilford. Flora Lipps, MD [facility]  [address] [place], [place] [address] [phone]  11/22/2019 8:29 AM

## 2020-02-06 DIAGNOSIS — Z113 Encounter for screening for infections with a predominantly sexual mode of transmission: Secondary | ICD-10-CM | POA: Diagnosis not present

## 2020-02-06 DIAGNOSIS — N898 Other specified noninflammatory disorders of vagina: Secondary | ICD-10-CM | POA: Diagnosis not present

## 2020-02-14 DIAGNOSIS — Z20822 Contact with and (suspected) exposure to covid-19: Secondary | ICD-10-CM | POA: Diagnosis not present

## 2020-05-11 ENCOUNTER — Emergency Department (HOSPITAL_COMMUNITY)
Admission: EM | Admit: 2020-05-11 | Discharge: 2020-05-11 | Disposition: A | Discharge status: Home / Self Care | Payer: Medicaid Other | Attending: Emergency Medicine | Admitting: Emergency Medicine

## 2020-05-11 ENCOUNTER — Encounter (HOSPITAL_COMMUNITY): Payer: Self-pay | Admitting: Emergency Medicine

## 2020-05-11 ENCOUNTER — Other Ambulatory Visit: Payer: Self-pay

## 2020-05-11 ENCOUNTER — Emergency Department (HOSPITAL_COMMUNITY): Payer: Medicaid Other

## 2020-05-11 DIAGNOSIS — J45909 Unspecified asthma, uncomplicated: Secondary | ICD-10-CM | POA: Insufficient documentation

## 2020-05-11 DIAGNOSIS — N809 Endometriosis, unspecified: Secondary | ICD-10-CM | POA: Insufficient documentation

## 2020-05-11 DIAGNOSIS — Z7901 Long term (current) use of anticoagulants: Secondary | ICD-10-CM | POA: Insufficient documentation

## 2020-05-11 DIAGNOSIS — Z7722 Contact with and (suspected) exposure to environmental tobacco smoke (acute) (chronic): Secondary | ICD-10-CM | POA: Insufficient documentation

## 2020-05-11 DIAGNOSIS — R079 Chest pain, unspecified: Secondary | ICD-10-CM | POA: Insufficient documentation

## 2020-05-11 LAB — I-STAT BETA HCG BLOOD, ED (MC, WL, AP ONLY): I-stat hCG, quantitative: <5 m[IU]/mL (ref <5)

## 2020-05-11 LAB — TROPONIN I (HIGH SENSITIVITY)
Troponin I (High Sensitivity): 3 ng/L (ref <18)
Troponin I (High Sensitivity): <2 ng/L (ref <18)

## 2020-05-11 LAB — COMPREHENSIVE METABOLIC PANEL
ALT: 17 U/L (ref 0–44)
AST: 20 U/L (ref 15–41)
Albumin: 4.2 g/dL (ref 3.5–5.0)
Alkaline Phosphatase: 47 U/L (ref 38–126)
Anion gap: 16 — ABNORMAL HIGH (ref 5–15)
BUN: 9 mg/dL (ref 6–20)
CO2: 20 mmol/L — ABNORMAL LOW (ref 22–32)
Calcium: 9.6 mg/dL (ref 8.9–10.3)
Chloride: 104 mmol/L (ref 98–111)
Creatinine, Ser: 0.9 mg/dL (ref 0.44–1.00)
GFR, Estimated: >60 mL/min (ref >60)
Glucose, Bld: 166 mg/dL — ABNORMAL HIGH (ref 70–99)
Potassium: 4.1 mmol/L (ref 3.5–5.1)
Sodium: 140 mmol/L (ref 135–145)
Total Bilirubin: 0.9 mg/dL (ref 0.3–1.2)
Total Protein: 7.7 g/dL (ref 6.5–8.1)

## 2020-05-11 LAB — LIPASE, BLOOD: Lipase: 19 U/L (ref 11–51)

## 2020-05-11 LAB — CBC
HCT: 43.3 % (ref 36.0–46.0)
Hemoglobin: 15.1 g/dL — ABNORMAL HIGH (ref 12.0–15.0)
MCH: 28.2 pg (ref 26.0–34.0)
MCHC: 34.9 g/dL (ref 30.0–36.0)
MCV: 80.8 fL (ref 80.0–100.0)
Platelets: 358 10*3/uL (ref 150–400)
RBC: 5.36 MIL/uL — ABNORMAL HIGH (ref 3.87–5.11)
RDW: 13.6 % (ref 11.5–15.5)
WBC: 9.1 10*3/uL (ref 4.0–10.5)
nRBC: 0 % (ref 0.0–0.2)

## 2020-05-11 MED ORDER — METOCLOPRAMIDE HCL 10 MG PO TABS: 10.0000 mg | ORAL_TABLET | Freq: Four times a day (QID) | ORAL | 0 refills | Status: DC

## 2020-05-11 MED ORDER — HALOPERIDOL LACTATE 5 MG/ML IJ SOLN
1.0000 mg | Freq: Once | INTRAMUSCULAR | Status: AC
  Administered 2020-05-11: 1 mg via INTRAVENOUS
  Filled 2020-05-11: qty 1

## 2020-05-11 MED ORDER — TRAMADOL HCL 50 MG PO TABS
50.0000 mg | ORAL_TABLET | Freq: Four times a day (QID) | ORAL | 0 refills | Status: AC | PRN
Start: 2020-05-11 — End: 2020-05-14

## 2020-05-11 MED ORDER — SODIUM CHLORIDE 0.9 % IV BOLUS
1000.0000 mL | Freq: Once | INTRAVENOUS | Status: AC
  Administered 2020-05-11: 1000 mL via INTRAVENOUS

## 2020-05-11 MED ORDER — ONDANSETRON 4 MG PO TBDP
4.0000 mg | ORAL_TABLET | Freq: Once | ORAL | Status: AC | PRN
  Administered 2020-05-11: 4 mg via ORAL
  Filled 2020-05-11: qty 1

## 2020-05-11 MED ORDER — KETOROLAC TROMETHAMINE 30 MG/ML IJ SOLN
30.0000 mg | Freq: Once | INTRAMUSCULAR | Status: AC
  Administered 2020-05-11: 30 mg via INTRAVENOUS
  Filled 2020-05-11: qty 1

## 2020-05-11 NOTE — ED Provider Notes (Addendum)
[facility] EMERGENCY DEPARTMENT Provider Note   CSN: [id number] Arrival date & time: 05/11/20  1104     History Chief Complaint  Patient presents with  . Chest Pain  . Abdominal Pain    Joyce Franco is a 25 y.o. female.  HPI 25 year old female with history of asthma, atrial fibrillation, endometriosis presents to the ER with complaints of abdominal pain, nausea, vomiting that feels consistent with her endometriosis pain, however she feels some chest pain and palpitations like she is in atrial fibrillation.  She states she started her menstrual cycle today.  Patient was seen here about 7 months ago with similar presentation, found to be in new onset A. fib.  She is on Eliquis has been compliant with it.  She denies any fevers or chills, states she just feels "weird".  On my entry into the room, patient is actively vomiting.    Past Medical History:  Diagnosis Date  . Asthma   . Atrial fibrillation (HCC)   . Endometriosis, uterus   . Fractured pelvis (HCC)   . Medical history non-contributory   . Urticaria     Patient Active Problem List   Diagnosis Date Noted  . Atrial fibrillation with rapid ventricular response (HCC) 10/12/2019  . Contrast media adverse reaction 10/12/2019  . Dehydration 10/12/2019  . Metabolic acidosis 10/12/2019  . Hyperkalemia 10/12/2019  . Mild persistent asthma, uncomplicated 05/25/2019  . Seasonal and perennial allergic rhinitis 05/25/2019  . Anaphylactic shock due to adverse food reaction 05/25/2019    Past Surgical History:  Procedure Laterality Date  . ABLATION ON ENDOMETRIOSIS  11/08/2014   Procedure: ABLATION ON ENDOMETRIOSIS;  Surgeon: Geryl Rankins, MD;  Location: [facility] ORS;  Service: Gynecology;;  . CARDIOVERSION N/A 10/13/2019   Procedure: CARDIOVERSION;  Surgeon: Chrystie Nose, MD;  Location: [facility] ENDOSCOPY;  Service: Cardiovascular;  Laterality: N/A;  . CHROMOPERTUBATION  11/08/2014   Procedure: CHROMOPERTUBATION;   Surgeon: Geryl Rankins, MD;  Location: [facility] ORS;  Service: Gynecology;;  . FOOT SURGERY    . LAPAROSCOPY N/A 11/08/2014   Procedure: LAPAROSCOPY DIAGNOSTIC with peritoneal  biopsy;  Surgeon: Geryl Rankins, MD;  Location: [facility] ORS;  Service: Gynecology;  Laterality: N/A;  . TEE WITHOUT CARDIOVERSION N/A 10/13/2019   Procedure: TRANSESOPHAGEAL ECHOCARDIOGRAM (TEE);  Surgeon: Chrystie Nose, MD;  Location: [facility] ENDOSCOPY;  Service: Cardiovascular;  Laterality: N/A;     OB History    Gravida  0   Para  0   Term  0   Preterm  0   AB  0   Living  0     SAB  0   IAB  0   Ectopic  0   Multiple  0   Live Births              Family History  Problem Relation Age of Onset  . Healthy Mother   . Healthy Father     Social History   Tobacco Use  . Smoking status: Passive Smoke Exposure - Never Smoker  . Smokeless tobacco: Never Used  . Tobacco comment: boyfriend smokes inside his home  Vaping Use  . Vaping Use: Never used  Substance Use Topics  . Alcohol use: No  . Drug use: No    Home Medications Prior to Admission medications   Medication Sig Start Date End Date Taking? Authorizing Provider  apixaban (ELIQUIS) 5 MG TABS tablet Take 1 tablet (5 mg total) by mouth 2 (two) times daily. 10/15/19  Yes  Briant Cedar, MD  medroxyPROGESTERone (DEPO-PROVERA) 150 MG/ML injection Inject 1 mL (150 mg total) into the muscle once. Patient taking differently: Inject 150 mg into the muscle every 3 (three) months. 06/26/14  Yes Geryl Rankins, MD  metoCLOPramide (REGLAN) 10 MG tablet Take 1 tablet (10 mg total) by mouth every 6 (six) hours. 05/11/20  Yes Mare Ferrari, PA-C  metoprolol succinate (TOPROL XL) 25 MG 24 hr tablet Take 1 tablet (25 mg total) by mouth at bedtime. 10/30/19  Yes Jodelle Gross, NP  Prenatal Vit-Fe Fumarate-FA (PRENATAL PO) Take 1 tablet by mouth daily.   Yes [provider]  traMADol (ULTRAM) 50 MG tablet Take 1 tablet (50 mg total) by mouth  every 6 (six) hours as needed for up to 3 days. 05/11/20 05/14/20 Yes Mare Ferrari, PA-C    Allergies    Contrast media [iodinated diagnostic agents] and Ibuprofen  Review of Systems   Review of Systems  Constitutional: Negative for chills and fever.  HENT: Negative for ear pain and sore throat.   Eyes: Negative for pain and visual disturbance.  Respiratory: Negative for cough and shortness of breath.   Cardiovascular: Positive for chest pain and palpitations.  Gastrointestinal: Positive for abdominal pain and vomiting.  Genitourinary: Positive for pelvic pain. Negative for dysuria and hematuria.  Musculoskeletal: Negative for arthralgias and back pain.  Skin: Negative for color change and rash.  Neurological: Negative for seizures and syncope.  All other systems reviewed and are negative.   Physical Exam Updated Vital Signs BP 113/67   Pulse 74   Temp 97.6 F (36.4 C) (Oral)   Resp 17   LMP 05/10/2020   SpO2 99%   Physical Exam Vitals and nursing note reviewed.  Constitutional:      General: She is not in acute distress.    Appearance: She is well-developed and well-nourished.     Comments: Uncomfortable appearing female, actively vomiting, moaning  HENT:     Head: Normocephalic and atraumatic.  Eyes:     Conjunctiva/sclera: Conjunctivae normal.  Cardiovascular:     Rate and Rhythm: Normal rate and regular rhythm.     Heart sounds: No murmur heard.   Pulmonary:     Effort: Pulmonary effort is normal. No respiratory distress.     Breath sounds: Normal breath sounds. No wheezing.  Abdominal:     Palpations: Abdomen is soft.     Tenderness: There is abdominal tenderness.     Comments: Generalized nonfocal abdominal tenderness  Musculoskeletal:        General: No edema. Normal range of motion.     Cervical back: Neck supple.     Right lower leg: No tenderness.     Left lower leg: No tenderness.  Skin:    General: Skin is warm and dry.  Neurological:      General: No focal deficit present.     Mental Status: She is alert.  Psychiatric:        Mood and Affect: Mood and affect normal.     ED Results / Procedures / Treatments   Labs (all labs ordered are listed, but only abnormal results are displayed) Labs Reviewed  COMPREHENSIVE METABOLIC PANEL - Abnormal; Notable for the following components:      Result Value   CO2 20 (*)    Glucose, Bld 166 (*)    Anion gap 16 (*)    All other components within normal limits  CBC - Abnormal; Notable for the following  components:   RBC 5.36 (*)    Hemoglobin 15.1 (*)    All other components within normal limits  LIPASE, BLOOD  URINALYSIS, ROUTINE W REFLEX MICROSCOPIC  I-STAT BETA HCG BLOOD, ED ([facility], [facility], [facility] ONLY)  TROPONIN I (HIGH SENSITIVITY)  TROPONIN I (HIGH SENSITIVITY)    EKG EKG Interpretation  Date/Time:  Saturday May 11 2020 11:56:15 EST Ventricular Rate:  88 PR Interval:  140 QRS Duration: 72 QT Interval:  384 QTC Calculation: 464 R Axis:   85 Text Interpretation: Normal sinus rhythm Right atrial enlargement Cannot rule out Anterior infarct , age undetermined Abnormal ECG When comapred to prior, more wandering baseline and artifact. No STEMI Confirmed by Theda Belfastegeler, Chris ([id number]) on 05/11/2020 4:36:34 PM   Radiology DG Chest 2 View  Result Date: 05/11/2020 CLINICAL DATA:  Shortness of breath.  Chest pain. EXAM: CHEST - 2 VIEW COMPARISON:  December 23, 2011 FINDINGS: The heart, hila, and mediastinum are normal. No pneumothorax. No nodules or masses. No focal infiltrates. IMPRESSION: No active cardiopulmonary disease. Electronically Signed   By: Gerome Samavid  Williams III M.D   On: 05/11/2020 12:29    Procedures Procedures (including critical care time)  Medications Ordered in ED Medications  ondansetron (ZOFRAN-ODT) disintegrating tablet 4 mg (4 mg Oral Given 05/11/20 1540)  sodium chloride 0.9 % bolus 1,000 mL (1,000 mLs Intravenous New Bag/Given 05/11/20 1748)  ketorolac (TORADOL) 30  MG/ML injection 30 mg (30 mg Intravenous Given 05/11/20 1749)  haloperidol lactate (HALDOL) injection 1 mg (1 mg Intravenous Given 05/11/20 1748)    ED Course  I have reviewed the triage vital signs and the nursing notes.  Pertinent labs & imaging results that were available during my care of the patient were reviewed by me and considered in my medical decision making (see chart for details).    MDM Rules/Calculators/A&P                          25 year old female with nausea, vomiting, abdominal pain in the setting of recent initiation menstrual cycle, complaining of also chest pain and palpitations.On arrival, she is uncomfortable appearing, vomiting. Vitals overall reassuring, no evidence of fever, tachycardia or hypoxia.  Abdomen generalized abdominal tenderness, nonfocal.  Labs ordered in triage, reviewed and interpreted by me -CBC without leukocytosis CMP without any significant electrode abnormalities, normal kidney and liver function tests.  Mildly elevated anion gap, SPECT this is secondary to vomiting.  Delta troponins are negative.  Lipase negative.  Pregnancy negative.  X-ray ordered in triage, with no evidence abnormality.  EKG normal sinus rhythm.  MDM: Patient was treated with Zofran in triage, however still actively vomiting.  Given Haldol, Toradol, fluid bolus.  On reevaluation approximately an hour later, patient is significant improvement in her symptoms.  No longer vomiting, pain is significantly subsided.  No longer tender in the abdomen on exam.  Low suspicion for PID, ovarian torsion, TOA, ectopic as the patient states that her symptoms are consistent with her endometriosis.  Low suspicion for appendicitis, cholecystitis, or any other surgical abdomen.  No evidence of ACS, low suspicion for PE, pneumonia.  Patient [facility].  Tolerated p.o. challenge well.  Will send home with some Reglan, patient states that she is allergic to ibuprofen, states  that she will sometimes take tramadol which is helpful.  PDMP reviewed and is appropriate.  Will send home with several pills. Final Clinical Impression(s) / ED Diagnoses Final diagnoses:  Endometriosis    Rx / DC Orders ED Discharge Orders         Ordered    traMADol (ULTRAM) 50 MG tablet  Every 6 hours PRN        05/11/20 1935    metoCLOPramide (REGLAN) 10 MG tablet  Every 6 hours        05/11/20 1935               Leone Brand 05/11/20 1936    Tegeler, Canary Brim, MD 05/11/20 [phone]

## 2020-05-11 NOTE — ED Triage Notes (Signed)
Pt reports generalized chest pain, abd pain, and SOB since last night. Also reports tingling to bilateral hands and feet.  States it feels like when she was in Afib last time.

## 2020-05-11 NOTE — Discharge Instructions (Addendum)
Please make sure to follow-up with your OB/GYN.  Take the nausea medicine and pain medicine as needed peer return to the ER for any new or worsening symptoms.

## 2021-03-09 ENCOUNTER — Encounter (HOSPITAL_BASED_OUTPATIENT_CLINIC_OR_DEPARTMENT_OTHER): Payer: Self-pay | Admitting: Emergency Medicine

## 2021-03-09 ENCOUNTER — Other Ambulatory Visit: Payer: Self-pay

## 2021-03-09 ENCOUNTER — Emergency Department (HOSPITAL_BASED_OUTPATIENT_CLINIC_OR_DEPARTMENT_OTHER)
Admission: EM | Admit: 2021-03-09 | Discharge: 2021-03-09 | Disposition: A | Discharge status: Home / Self Care | Payer: Medicaid Other | Attending: Emergency Medicine | Admitting: Emergency Medicine

## 2021-03-09 DIAGNOSIS — R1030 Lower abdominal pain, unspecified: Secondary | ICD-10-CM | POA: Diagnosis not present

## 2021-03-09 DIAGNOSIS — R112 Nausea with vomiting, unspecified: Secondary | ICD-10-CM | POA: Insufficient documentation

## 2021-03-09 DIAGNOSIS — Z5321 Procedure and treatment not carried out due to patient leaving prior to being seen by health care provider: Secondary | ICD-10-CM | POA: Insufficient documentation

## 2021-03-09 LAB — PREGNANCY, URINE: Preg Test, Ur: NEGATIVE

## 2021-03-09 LAB — COMPREHENSIVE METABOLIC PANEL
ALT: 15 U/L (ref 0–44)
AST: 14 U/L — ABNORMAL LOW (ref 15–41)
Albumin: 4 g/dL (ref 3.5–5.0)
Alkaline Phosphatase: 49 U/L (ref 38–126)
Anion gap: 9 (ref 5–15)
BUN: 9 mg/dL (ref 6–20)
CO2: 26 mmol/L (ref 22–32)
Calcium: 9 mg/dL (ref 8.9–10.3)
Chloride: 104 mmol/L (ref 98–111)
Creatinine, Ser: 0.79 mg/dL (ref 0.44–1.00)
GFR, Estimated: >60 mL/min (ref >60)
Glucose, Bld: 89 mg/dL (ref 70–99)
Potassium: 4 mmol/L (ref 3.5–5.1)
Sodium: 139 mmol/L (ref 135–145)
Total Bilirubin: 0.7 mg/dL (ref 0.3–1.2)
Total Protein: 7.2 g/dL (ref 6.5–8.1)

## 2021-03-09 LAB — CBC
HCT: 39.4 % (ref 36.0–46.0)
Hemoglobin: 13.7 g/dL (ref 12.0–15.0)
MCH: 28.5 pg (ref 26.0–34.0)
MCHC: 34.8 g/dL (ref 30.0–36.0)
MCV: 81.9 fL (ref 80.0–100.0)
Platelets: 307 10*3/uL (ref 150–400)
RBC: 4.81 MIL/uL (ref 3.87–5.11)
RDW: 13.6 % (ref 11.5–15.5)
WBC: 9.6 10*3/uL (ref 4.0–10.5)
nRBC: 0 % (ref 0.0–0.2)

## 2021-03-09 LAB — URINALYSIS, MICROSCOPIC (REFLEX): RBC / HPF: >50 RBC/hpf (ref 0–5)

## 2021-03-09 LAB — URINALYSIS, ROUTINE W REFLEX MICROSCOPIC
Glucose, UA: NEGATIVE mg/dL
Ketones, ur: 40 mg/dL — AB
Leukocytes,Ua: NEGATIVE
Nitrite: POSITIVE — AB
Protein, ur: 100 mg/dL — AB
Specific Gravity, Urine: >=1.03 (ref 1.005–1.030)
pH: 5.5 (ref 5.0–8.0)

## 2021-03-09 LAB — LIPASE, BLOOD: Lipase: 26 U/L (ref 11–51)

## 2021-03-09 NOTE — ED Triage Notes (Signed)
Pt arrives pov, ambulatory to triage,  with c/o lower abdominal pain x 2 days, reports hx of endometriosis, endorses N/V. Endorses "advil at 0630 but threw it up"

## 2021-04-12 ENCOUNTER — Emergency Department (HOSPITAL_COMMUNITY): Payer: Medicaid Other

## 2021-04-12 ENCOUNTER — Other Ambulatory Visit: Payer: Self-pay

## 2021-04-12 ENCOUNTER — Encounter (HOSPITAL_COMMUNITY): Payer: Self-pay | Admitting: Emergency Medicine

## 2021-04-12 ENCOUNTER — Emergency Department (HOSPITAL_COMMUNITY)
Admission: EM | Admit: 2021-04-12 | Discharge: 2021-04-13 | Disposition: A | Discharge status: Home / Self Care | Payer: Medicaid Other | Attending: Emergency Medicine | Admitting: Emergency Medicine

## 2021-04-12 DIAGNOSIS — N9489 Other specified conditions associated with female genital organs and menstrual cycle: Secondary | ICD-10-CM | POA: Diagnosis not present

## 2021-04-12 DIAGNOSIS — E860 Dehydration: Secondary | ICD-10-CM

## 2021-04-12 DIAGNOSIS — Z3A01 Less than 8 weeks gestation of pregnancy: Secondary | ICD-10-CM | POA: Diagnosis not present

## 2021-04-12 DIAGNOSIS — J4530 Mild persistent asthma, uncomplicated: Secondary | ICD-10-CM | POA: Diagnosis not present

## 2021-04-12 DIAGNOSIS — O99511 Diseases of the respiratory system complicating pregnancy, first trimester: Secondary | ICD-10-CM | POA: Insufficient documentation

## 2021-04-12 DIAGNOSIS — F17290 Nicotine dependence, other tobacco product, uncomplicated: Secondary | ICD-10-CM | POA: Diagnosis not present

## 2021-04-12 DIAGNOSIS — O99331 Smoking (tobacco) complicating pregnancy, first trimester: Secondary | ICD-10-CM | POA: Insufficient documentation

## 2021-04-12 DIAGNOSIS — R109 Unspecified abdominal pain: Secondary | ICD-10-CM

## 2021-04-12 DIAGNOSIS — R1033 Periumbilical pain: Secondary | ICD-10-CM | POA: Diagnosis not present

## 2021-04-12 DIAGNOSIS — O219 Vomiting of pregnancy, unspecified: Secondary | ICD-10-CM | POA: Diagnosis present

## 2021-04-12 DIAGNOSIS — R112 Nausea with vomiting, unspecified: Secondary | ICD-10-CM

## 2021-04-12 LAB — COMPREHENSIVE METABOLIC PANEL
ALT: 22 U/L (ref 0–44)
AST: 18 U/L (ref 15–41)
Albumin: 4.7 g/dL (ref 3.5–5.0)
Alkaline Phosphatase: 50 U/L (ref 38–126)
Anion gap: 12 (ref 5–15)
BUN: 10 mg/dL (ref 6–20)
CO2: 22 mmol/L (ref 22–32)
Calcium: 9.6 mg/dL (ref 8.9–10.3)
Chloride: 104 mmol/L (ref 98–111)
Creatinine, Ser: 0.74 mg/dL (ref 0.44–1.00)
GFR, Estimated: >60 mL/min (ref >60)
Glucose, Bld: 146 mg/dL — ABNORMAL HIGH (ref 70–99)
Potassium: 3.7 mmol/L (ref 3.5–5.1)
Sodium: 138 mmol/L (ref 135–145)
Total Bilirubin: 0.6 mg/dL (ref 0.3–1.2)
Total Protein: 8.3 g/dL — ABNORMAL HIGH (ref 6.5–8.1)

## 2021-04-12 LAB — HCG, QUANTITATIVE, PREGNANCY: hCG, Beta Chain, Quant, S: 8729 m[IU]/mL — ABNORMAL HIGH (ref <5)

## 2021-04-12 LAB — LIPASE, BLOOD: Lipase: 26 U/L (ref 11–51)

## 2021-04-12 LAB — URINALYSIS, ROUTINE W REFLEX MICROSCOPIC
Bilirubin Urine: NEGATIVE
Glucose, UA: NEGATIVE mg/dL
Ketones, ur: 80 mg/dL — AB
Leukocytes,Ua: NEGATIVE
Nitrite: POSITIVE — AB
Protein, ur: 100 mg/dL — AB
Specific Gravity, Urine: 1.028 (ref 1.005–1.030)
pH: 6 (ref 5.0–8.0)

## 2021-04-12 LAB — CBC WITH DIFFERENTIAL/PLATELET
Abs Immature Granulocytes: 0.08 10*3/uL — ABNORMAL HIGH (ref 0.00–0.07)
Basophils Absolute: 0 10*3/uL (ref 0.0–0.1)
Basophils Relative: 0 %
Eosinophils Absolute: 0 10*3/uL (ref 0.0–0.5)
Eosinophils Relative: 0 %
HCT: 42.3 % (ref 36.0–46.0)
Hemoglobin: 14.7 g/dL (ref 12.0–15.0)
Immature Granulocytes: 1 %
Lymphocytes Relative: 19 %
Lymphs Abs: 2.2 10*3/uL (ref 0.7–4.0)
MCH: 28.5 pg (ref 26.0–34.0)
MCHC: 34.8 g/dL (ref 30.0–36.0)
MCV: 82.1 fL (ref 80.0–100.0)
Monocytes Absolute: 0.6 10*3/uL (ref 0.1–1.0)
Monocytes Relative: 6 %
Neutro Abs: 8.4 10*3/uL — ABNORMAL HIGH (ref 1.7–7.7)
Neutrophils Relative %: 74 %
Platelets: 354 10*3/uL (ref 150–400)
RBC: 5.15 MIL/uL — ABNORMAL HIGH (ref 3.87–5.11)
RDW: 13.4 % (ref 11.5–15.5)
WBC: 11.4 10*3/uL — ABNORMAL HIGH (ref 4.0–10.5)
nRBC: 0 % (ref 0.0–0.2)

## 2021-04-12 LAB — PREGNANCY, URINE: Preg Test, Ur: POSITIVE — AB

## 2021-04-12 LAB — MAGNESIUM: Magnesium: 1.8 mg/dL (ref 1.7–2.4)

## 2021-04-12 MED ORDER — LACTATED RINGERS IV BOLUS
2000.0000 mL | Freq: Once | INTRAVENOUS | Status: AC
  Administered 2021-04-12: 2000 mL via INTRAVENOUS

## 2021-04-12 MED ORDER — ONDANSETRON 4 MG PO TBDP
4.0000 mg | ORAL_TABLET | Freq: Once | ORAL | Status: AC
  Administered 2021-04-12: 4 mg via ORAL
  Filled 2021-04-12: qty 1

## 2021-04-12 MED ORDER — DROPERIDOL 2.5 MG/ML IJ SOLN
1.2500 mg | Freq: Once | INTRAMUSCULAR | Status: AC
  Administered 2021-04-12: 1.25 mg via INTRAVENOUS
  Filled 2021-04-12: qty 2

## 2021-04-12 MED ORDER — METOCLOPRAMIDE HCL 5 MG/ML IJ SOLN
10.0000 mg | Freq: Once | INTRAMUSCULAR | Status: AC
  Administered 2021-04-12: 10 mg via INTRAVENOUS
  Filled 2021-04-12: qty 2

## 2021-04-12 MED ORDER — KETOROLAC TROMETHAMINE 15 MG/ML IJ SOLN
15.0000 mg | Freq: Once | INTRAMUSCULAR | Status: AC
  Administered 2021-04-12: 15 mg via INTRAVENOUS
  Filled 2021-04-12: qty 1

## 2021-04-12 MED ORDER — HYDROMORPHONE HCL 1 MG/ML IJ SOLN
0.5000 mg | Freq: Once | INTRAMUSCULAR | Status: AC
Start: 2021-04-12 — End: 2021-04-12
  Administered 2021-04-12: 0.5 mg via INTRAVENOUS
  Filled 2021-04-12: qty 1

## 2021-04-12 NOTE — ED Notes (Signed)
Pt is stating she can not provide a urine sample at this time.

## 2021-04-12 NOTE — ED Notes (Signed)
Pt ambulated to restroom and back to room without assistance.  

## 2021-04-12 NOTE — ED Provider Notes (Signed)
[facility] DEPT Provider Note   CSN: [id number] Arrival date & time: 04/12/21  1323     History Chief Complaint  Patient presents with   Abdominal Pain   Nausea   Emesis    Joyce Franco is a 25 y.o. female.  The history is provided by the patient.  Abdominal Pain Pain location:  Periumbilical Associated symptoms: nausea and vomiting   Associated symptoms: no chest pain, no chills, no constipation, no cough, no diarrhea, no dysuria, no fever, no hematuria, no shortness of breath, no sore throat and no vaginal discharge   Emesis Associated symptoms: abdominal pain   Associated symptoms: no arthralgias, no chills, no cough, no diarrhea, no fever, no myalgias and no sore throat   Patient presents for 2 days of nausea, vomiting, and cramping abdominal pain.  Location of abdominal pain is across the midline of lower abdomen.  She has had episodes like this before.  She states that she has a history of endometriosis and her symptoms are consistent with previous episodes of flares that she has attributed to her endometriosis.  Last menstrual period was 1 month ago.  She denies any recent vaginal bleeding or discharge.  She denies any dysuria, fevers, chills, or diarrhea.  Her last bowel movement was yesterday and described as normal.  She states that her nausea and vomiting has been so severe that she has not been able to tolerate any p.o. intake over the past 2 days.  She does not have any antiemetics at home.  Patient urgently arrived via EMS.  EMS did provide her with Zofran.  While in the ED waiting room, patient was given Reglan.  She states that her nausea has improved.  Last episode of vomiting was 30 minutes prior to being bedded in the ED.  She does endorse mild continued nausea and continued lower abdominal pain.    Past Medical History:  Diagnosis Date   Asthma    Atrial fibrillation ([place])    Endometriosis, uterus    Fractured pelvis [facility])     Medical history non-contributory    Urticaria     Patient Active Problem List   Diagnosis Date Noted   Atrial fibrillation with rapid ventricular response ([place]) 10/12/2019   Contrast media adverse reaction 10/12/2019   Dehydration [id number]   Metabolic acidosis A999333   Hyperkalemia 10/12/2019   Mild persistent asthma, uncomplicated [id number]   Seasonal and perennial allergic rhinitis 05/25/2019   Anaphylactic shock due to adverse food reaction 05/25/2019    Past Surgical History:  Procedure Laterality Date   ABLATION ON ENDOMETRIOSIS  11/08/2014   Procedure: ABLATION ON ENDOMETRIOSIS;  Surgeon: Thurnell Lose, MD;  Location: [facility];  Service: Gynecology;;   CARDIOVERSION N/A 10/13/2019   Procedure: CARDIOVERSION;  Surgeon: Pixie Casino, MD;  Location: [facility];  Service: Cardiovascular;  Laterality: N/A;   CHROMOPERTUBATION  11/08/2014   Procedure: CHROMOPERTUBATION;  Surgeon: Thurnell Lose, MD;  Location: [facility];  Service: Gynecology;;   FOOT SURGERY     LAPAROSCOPY N/A 11/08/2014   Procedure: LAPAROSCOPY DIAGNOSTIC with peritoneal  biopsy;  Surgeon: Thurnell Lose, MD;  Location: [facility];  Service: Gynecology;  Laterality: N/A;   TEE WITHOUT CARDIOVERSION N/A 10/13/2019   Procedure: TRANSESOPHAGEAL ECHOCARDIOGRAM (TEE);  Surgeon: Pixie Casino, MD;  Location: [facility];  Service: Cardiovascular;  Laterality: N/A;     OB History     Gravida  0   Para  0   Term  0  Preterm  0   AB  0   Living  0      SAB  0   IAB  0   Ectopic  0   Multiple  0   Live Births              Family History  Problem Relation Age of Onset   Healthy Mother    Healthy Father     Social History   Tobacco Use   Smoking status: Every Day    Types: Cigars    Passive exposure: Yes   Smokeless tobacco: Never   Tobacco comments:    boyfriend smokes inside his home  Vaping Use   Vaping Use: Every day  Substance Use Topics   Alcohol use: Yes    Comment: occ    Drug use: No    Home Medications Prior to Admission medications   Medication Sig Start Date End Date Taking? Authorizing Provider  medroxyPROGESTERone (DEPO-PROVERA) 150 MG/ML injection Inject 1 mL (150 mg total) into the muscle once. Patient taking differently: Inject 150 mg into the muscle every 3 (three) months. 06/26/14  Yes Thurnell Lose, MD  ondansetron (ZOFRAN-ODT) 4 MG disintegrating tablet Take 1 tablet (4 mg total) by mouth every 8 (eight) hours as needed for nausea or vomiting. 04/13/21  Yes Isla Pence, MD  Prenatal Vit-Fe Fumarate-FA (PRENATAL VITAMINS) 28-0.8 MG TABS Take 1 tablet by mouth daily. 04/13/21  Yes Isla Pence, MD  apixaban (ELIQUIS) 5 MG TABS tablet Take 1 tablet (5 mg total) by mouth 2 (two) times daily. Patient not taking: Reported on 04/12/2021 10/15/19   Alma Friendly, MD  metoCLOPramide (REGLAN) 10 MG tablet Take 1 tablet (10 mg total) by mouth every 6 (six) hours. Patient not taking: Reported on 04/12/2021 05/11/20   Sharyn Lull A, PA-C  metoprolol succinate (TOPROL XL) 25 MG 24 hr tablet Take 1 tablet (25 mg total) by mouth at bedtime. Patient not taking: Reported on 04/12/2021 10/30/19   Lendon Colonel, NP    Allergies    Contrast media [iodinated diagnostic agents] and Ibuprofen  Review of Systems   Review of Systems  Constitutional:  Positive for appetite change. Negative for chills and fever.  HENT:  Negative for congestion, ear pain, sore throat and trouble swallowing.   Eyes:  Negative for pain and visual disturbance.  Respiratory:  Negative for cough, chest tightness, shortness of breath and wheezing.   Cardiovascular:  Negative for chest pain, palpitations and leg swelling.  Gastrointestinal:  Positive for abdominal pain, nausea and vomiting. Negative for constipation and diarrhea.  Genitourinary:  Negative for dysuria, flank pain, hematuria, pelvic pain and vaginal discharge.  Musculoskeletal:  Negative for arthralgias,  back pain, joint swelling, myalgias and neck pain.  Skin:  Negative for color change and rash.  Neurological:  Negative for dizziness, seizures, syncope, weakness, light-headedness and numbness.  Hematological:  Does not bruise/bleed easily.  Psychiatric/Behavioral:  Negative for confusion and decreased concentration.   All other systems reviewed and are negative.  Physical Exam Updated Vital Signs BP 112/69   Pulse 94   Temp 98 F (36.7 C) (Oral)   Resp 16   Ht 5\' 4"  (1.626 m)   Wt 80 kg   LMP 03/18/2021   SpO2 98%   BMI 30.27 kg/m   Physical Exam Vitals and nursing note reviewed.  Constitutional:      General: She is not in acute distress.    Appearance: She is well-developed and normal  weight. She is not ill-appearing, toxic-appearing or diaphoretic.  HENT:     Head: Normocephalic and atraumatic.     Mouth/Throat:     Mouth: Mucous membranes are moist.     Pharynx: Oropharynx is clear.  Eyes:     General: No scleral icterus.    Conjunctiva/sclera: Conjunctivae normal.     Pupils: Pupils are equal, round, and reactive to light.  Cardiovascular:     Rate and Rhythm: Normal rate and regular rhythm.     Heart sounds: No murmur heard. Pulmonary:     Effort: Pulmonary effort is normal. No respiratory distress.     Breath sounds: Normal breath sounds. No wheezing or rales.  Chest:     Chest wall: No tenderness.  Abdominal:     Palpations: Abdomen is soft.     Tenderness: There is abdominal tenderness in the periumbilical area. There is no right CVA tenderness, left CVA tenderness, guarding or rebound.  Musculoskeletal:        General: No swelling.     Cervical back: Neck supple.  Skin:    General: Skin is warm and dry.     Capillary Refill: Capillary refill takes less than 2 seconds.  Neurological:     General: No focal deficit present.     Mental Status: She is alert and oriented to person, place, and time.     Cranial Nerves: No cranial nerve deficit.     Motor:  No weakness.  Psychiatric:        Mood and Affect: Mood normal.        Behavior: Behavior normal.    ED Results / Procedures / Treatments   Labs (all labs ordered are listed, but only abnormal results are displayed) Labs Reviewed  CBC WITH DIFFERENTIAL/PLATELET - Abnormal; Notable for the following components:      Result Value   WBC 11.4 (*)    RBC 5.15 (*)    Neutro Abs 8.4 (*)    Abs Immature Granulocytes 0.08 (*)    All other components within normal limits  COMPREHENSIVE METABOLIC PANEL - Abnormal; Notable for the following components:   Glucose, Bld 146 (*)    Total Protein 8.3 (*)    All other components within normal limits  URINALYSIS, ROUTINE W REFLEX MICROSCOPIC - Abnormal; Notable for the following components:   APPearance CLOUDY (*)    Hgb urine dipstick MODERATE (*)    Ketones, ur 80 (*)    Protein, ur 100 (*)    Nitrite POSITIVE (*)    Bacteria, UA MANY (*)    All other components within normal limits  PREGNANCY, URINE - Abnormal; Notable for the following components:   Preg Test, Ur POSITIVE (*)    All other components within normal limits  HCG, QUANTITATIVE, PREGNANCY - Abnormal; Notable for the following components:   hCG, Beta Chain, Quant, S 8,729 (*)    All other components within normal limits  URINE CULTURE  LIPASE, BLOOD  MAGNESIUM    EKG EKG Interpretation  Date/Time:  Saturday April 12 2021 20:19:25 EST Ventricular Rate:  85 PR Interval:  183 QRS Duration: 77 QT Interval:  362 QTC Calculation: 431 R Axis:   75 Text Interpretation: Sinus arrhythmia Left atrial enlargement Confirmed by Gloris Manchester ([id number]) on 04/12/2021 8:42:03 PM  Radiology US OB LESS THAN 14 WEEKS WITH OB TRANSVAGINAL  Result Date: 04/13/2021 CLINICAL DATA:  Initial evaluation for acute abdominal pain, early pregnancy. EXAM: OBSTETRIC <14 WK [place] AND TRANSVAGINAL OB  US TECHNIQUE: Both transabdominal and transvaginal ultrasound examinations were performed for complete  evaluation of the gestation as well as the maternal uterus, adnexal regions, and pelvic cul-de-sac. Transvaginal technique was performed to assess early pregnancy. COMPARISON:  None available. FINDINGS: Intrauterine gestational sac: Single Yolk sac:  Present Embryo:  Present Cardiac Activity: Negative. Heart Rate: N/A CRL:  2.5 mm   5 w   6 d                  [place] EDC: 12/08/2021 Subchorionic hemorrhage:  None. Maternal uterus/adnexae: Ovaries within normal limits. No adnexal mass or free fluid. IMPRESSION: 1. Single IUP with internal yolk sac and embryo, but no detectable cardiac activity. Crown-rump length measures 2.5 mm. Findings are suspicious but not yet definitive for failed pregnancy. Recommend follow-up US in 10-14 days for definitive diagnosis. This recommendation follows SRU consensus guidelines: Diagnostic Criteria for Nonviable Pregnancy Early in the First Trimester. [employer] Med 2013KT:048977. 2. No other acute maternal uterine or adnexal abnormality. Electronically Signed   By: Jeannine Boga M.D.   On: 04/13/2021 01:16    Procedures Procedures   Medications Ordered in ED Medications  metoCLOPramide (REGLAN) injection 10 mg (10 mg Intravenous Given 04/12/21 1845)  ondansetron (ZOFRAN-ODT) disintegrating tablet 4 mg (4 mg Oral Given 04/12/21 1839)  lactated ringers bolus 2,000 mL (0 mLs Intravenous Stopped 04/12/21 2147)  droperidol (INAPSINE) 2.5 MG/ML injection 1.25 mg (1.25 mg Intravenous Given 04/12/21 2059)  ketorolac (TORADOL) 15 MG/ML injection 15 mg (15 mg Intravenous Given 04/12/21 2130)  HYDROmorphone (DILAUDID) injection 0.5 mg (0.5 mg Intravenous Given 04/12/21 2130)    ED Course  I have reviewed the triage vital signs and the nursing notes.  Pertinent labs & imaging results that were available during my care of the patient were reviewed by me and considered in my medical decision making (see chart for details).    MDM Rules/Calculators/A&P                           Patient with history of endometriosis, and previous ED visits for nausea, vomiting, and p.o. intolerance, presents for 2 days of vomiting and p.o. intolerance.  She has also had lower abdominal cramping pain.  Prior to being bedded in the ED, she has received Zofran and Reglan with some relief.  She endorses continued nausea.  Given her p.o. intolerance, 2 L of IV fluid were ordered.  His lab work shows normal electrolytes.  She does have a leukocytosis.  Following IV fluids, she continues to endorse nausea and abdominal pain.  She is requesting additional medications for symptomatic relief.  These were provided.  Following rehydration, patient was ultimately able to provide a urine sample which was positive for pregnancy.  Quantitative hCG was ordered.  Patient was informed of this result.  This is an unexpected finding for the patient, who is accompanied in the ED with her mother.  Patient denies any history of prior pregnancies.  She has established care with OB due to her history of endometriosis.  Patient currently has improved symptoms and does request something to drink.  Patient was provided p.o. fluids.  hCG was 8729, well above discriminatory zone.  Transvaginal ultrasound was ordered.  At this point, care of patient was signed out to oncoming ED provider.  Final Clinical Impression(s) / ED Diagnoses Final diagnoses:  Abdominal pain  Dehydration  Nausea and vomiting, unspecified vomiting type  Less than [redacted]  weeks gestation of pregnancy    Rx / DC Orders ED Discharge Orders          Ordered    ondansetron (ZOFRAN-ODT) 4 MG disintegrating tablet  Every 8 hours PRN        04/13/21 0128    Prenatal Vit-Fe Fumarate-FA (PRENATAL VITAMINS) 28-0.8 MG TABS  Daily        04/13/21 0128             Godfrey Pick, MD 04/13/21 1447

## 2021-04-12 NOTE — ED Triage Notes (Signed)
BIB Ems from home, complains of N/V x2 days. LMP 11/6, hx of endometriosis. EMS gave 4mg  Zofran IV. Reports abd pain and cramping.   CBG 91

## 2021-04-12 NOTE — ED Provider Notes (Signed)
Emergency Medicine Provider Triage Evaluation Note  Joyce Franco , a 25 y.o. female  was evaluated in triage.  Pt complains of vomitting. History of endometrosis  Review of Systems  Positive: vomiting Negative: No fever  Physical Exam  BP (!) 107/92 (BP Location: Left Arm)   Pulse (!) 105   Temp 97.6 F (36.4 C) (Oral)   Resp 18   Ht 5\' 4"  (1.626 m)   Wt 80 kg   LMP 03/18/2021   SpO2 100%   BMI 30.27 kg/m  Gen:   Awake, no distress   Resp:  Normal effort  MSK:   Moves extremities without difficulty  Other:    Medical Decision Making  Medically screening exam initiated at 1:36 PM.  Appropriate orders placed.  03/20/2021 was informed that the remainder of the evaluation will be completed by another provider, this initial triage assessment does not replace that evaluation, and the importance of remaining in the ED until their evaluation is complete.     Joyce Franco, Elson Areas 04/12/21 1338    14/10/22, MD 04/12/21 [phone]

## 2021-04-12 NOTE — ED Notes (Signed)
Date and time results received: 04/12/21 2013  Test: urine preg Critical Value: positive  Name of Provider Notified: Durwin Nora  Orders Received? Or Actions Taken?: n/a

## 2021-04-12 NOTE — ED Notes (Signed)
Call placed to lab, will add on lipase and mag

## 2021-04-13 MED ORDER — ONDANSETRON 4 MG PO TBDP: 4.0000 mg | ORAL_TABLET | Freq: Three times a day (TID) | ORAL | 0 refills | Status: DC | PRN

## 2021-04-13 MED ORDER — PRENATAL VITAMINS 28-0.8 MG PO TABS: 1.0000 | ORAL_TABLET | Freq: Every day | ORAL | 0 refills | Status: DC

## 2021-04-13 NOTE — ED Provider Notes (Signed)
Pt signed out by Dr. Durwin Nora pending [place].   IMPRESSION:  1. Single IUP with internal yolk sac and embryo, but no detectable  cardiac activity. Crown-rump length measures 2.5 mm. Findings are  suspicious but not yet definitive for failed pregnancy. Recommend  follow-up US in 10-14 days for definitive diagnosis. This  recommendation follows SRU consensus guidelines: Diagnostic Criteria  for Nonviable Pregnancy Early in the First Trimester. Malva Limes Med  2013; 494:4967-59.  2. No other acute maternal uterine or adnexal abnormality.   Pt told of results and knows to get a repeat US in 10-14 days.    She is tolerating po fluids.  She is stable for d/c.  Return if worse.  She is to f/u with obgyn.     Jacalyn Lefevre, MD 04/13/21 [phone]

## 2021-04-13 NOTE — Discharge Instructions (Addendum)
Take tylenol as needed for pain.  You will need a repeat ultrasound in 10-14 days to recheck the pregnancy.

## 2021-04-13 NOTE — ED Notes (Signed)
Voicemail left for Ultrasound tech

## 2021-04-14 LAB — URINE CULTURE

## 2021-05-04 NOTE — L&D Delivery Note (Signed)
Delivery Note Labor onset: 10/31/2021  Labor Onset Time: 2059 Complete dilation at 8:59 PM  Onset of pushing at 2120 FHR second stage Cat 2 Analgesia/Anesthesia intrapartum: epidural  Voluntary pushing with maternal urge. Delivery of a viable female at 2126. Fetal head delivered in LOA position. Preterm infant cried after stimulation, good HR,  and good tone Nuchal cord: x1, reduced.  Infant placed on maternal abd, dried, and tactile stim.  Cord double clamped after 1 min and cut by the patient.  RN x2 and NICU team present for birth.  Cord blood sample collected: Yes Arterial cord blood sample collected: attempted to collect arterial gas and was unsuccessful.  Placenta delivered Tomasa Blase, intact, with what appears to be a 2 VC.  Placenta to pathology. Uterine tone firm, bleeding scant, no clots  No laceration identified.  EBL (mL): 250 Complications: none APGAR: APGAR (1 MIN): 8   APGAR (5 MINS): 9   APGAR (10 MINS):   Mom to  [facility]  Baby to NICU. Mother to continue magnesium therapy x 24 hrs post-delivery for pre-eclamsia with severe features.   Roma Schanz DNP, CNM 10/31/2021, 10:33 PM

## 2021-05-13 LAB — OB RESULTS CONSOLE HIV ANTIBODY (ROUTINE TESTING): HIV: NONREACTIVE

## 2021-05-13 LAB — OB RESULTS CONSOLE RUBELLA ANTIBODY, IGM: Rubella: IMMUNE

## 2021-05-13 LAB — OB RESULTS CONSOLE HEPATITIS B SURFACE ANTIGEN: Hepatitis B Surface Ag: NEGATIVE

## 2021-05-26 LAB — OB RESULTS CONSOLE GC/CHLAMYDIA
Chlamydia: NEGATIVE
Neisseria Gonorrhea: NEGATIVE

## 2021-06-23 IMAGING — CR DG CHEST 2V
2 series · 2 of 2 positions shown · non-contrast
Comparison: December 23, 2011

CLINICAL DATA: Shortness of breath.  Chest pain.

EXAM:
CHEST - 2 VIEW

[chest lat]
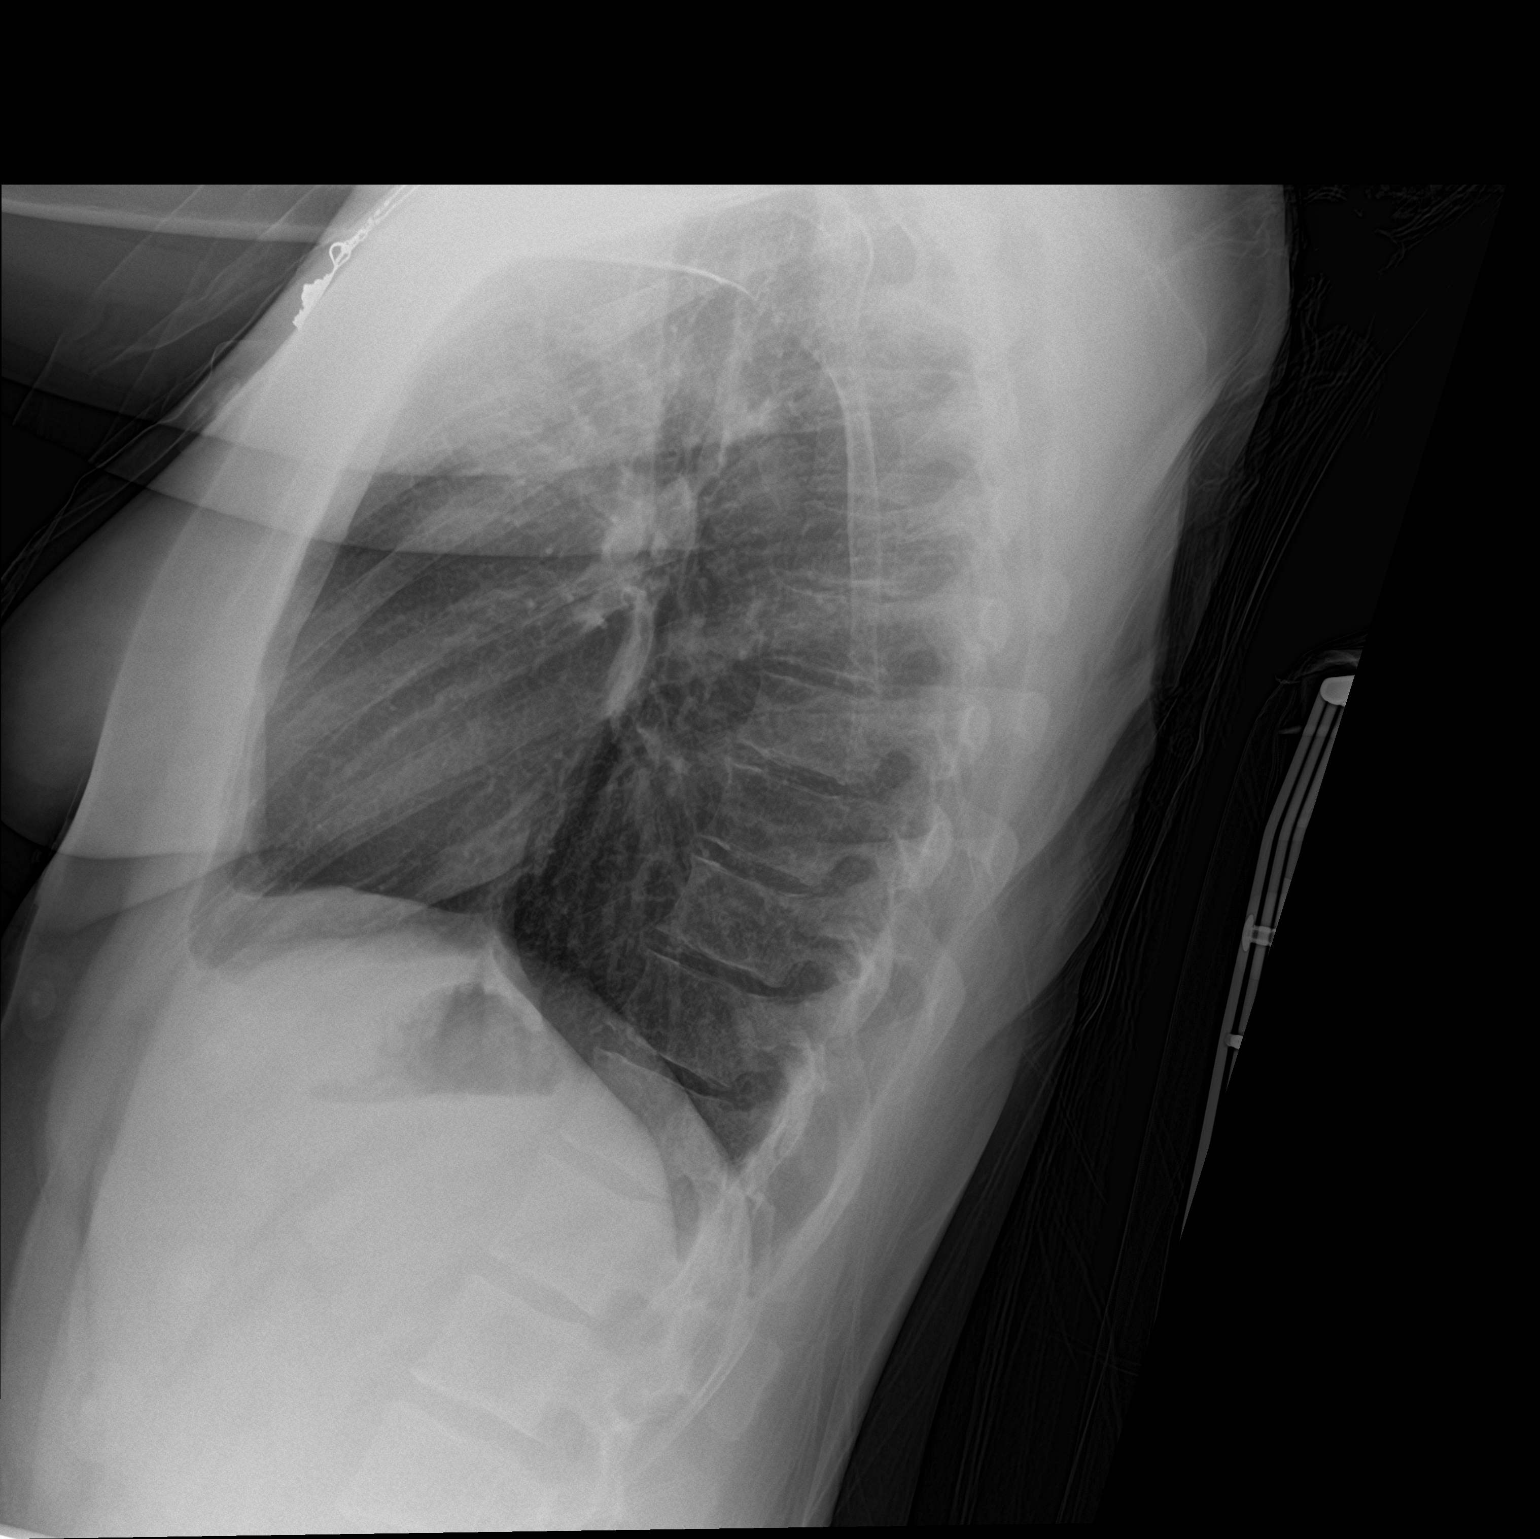

[chest ap]
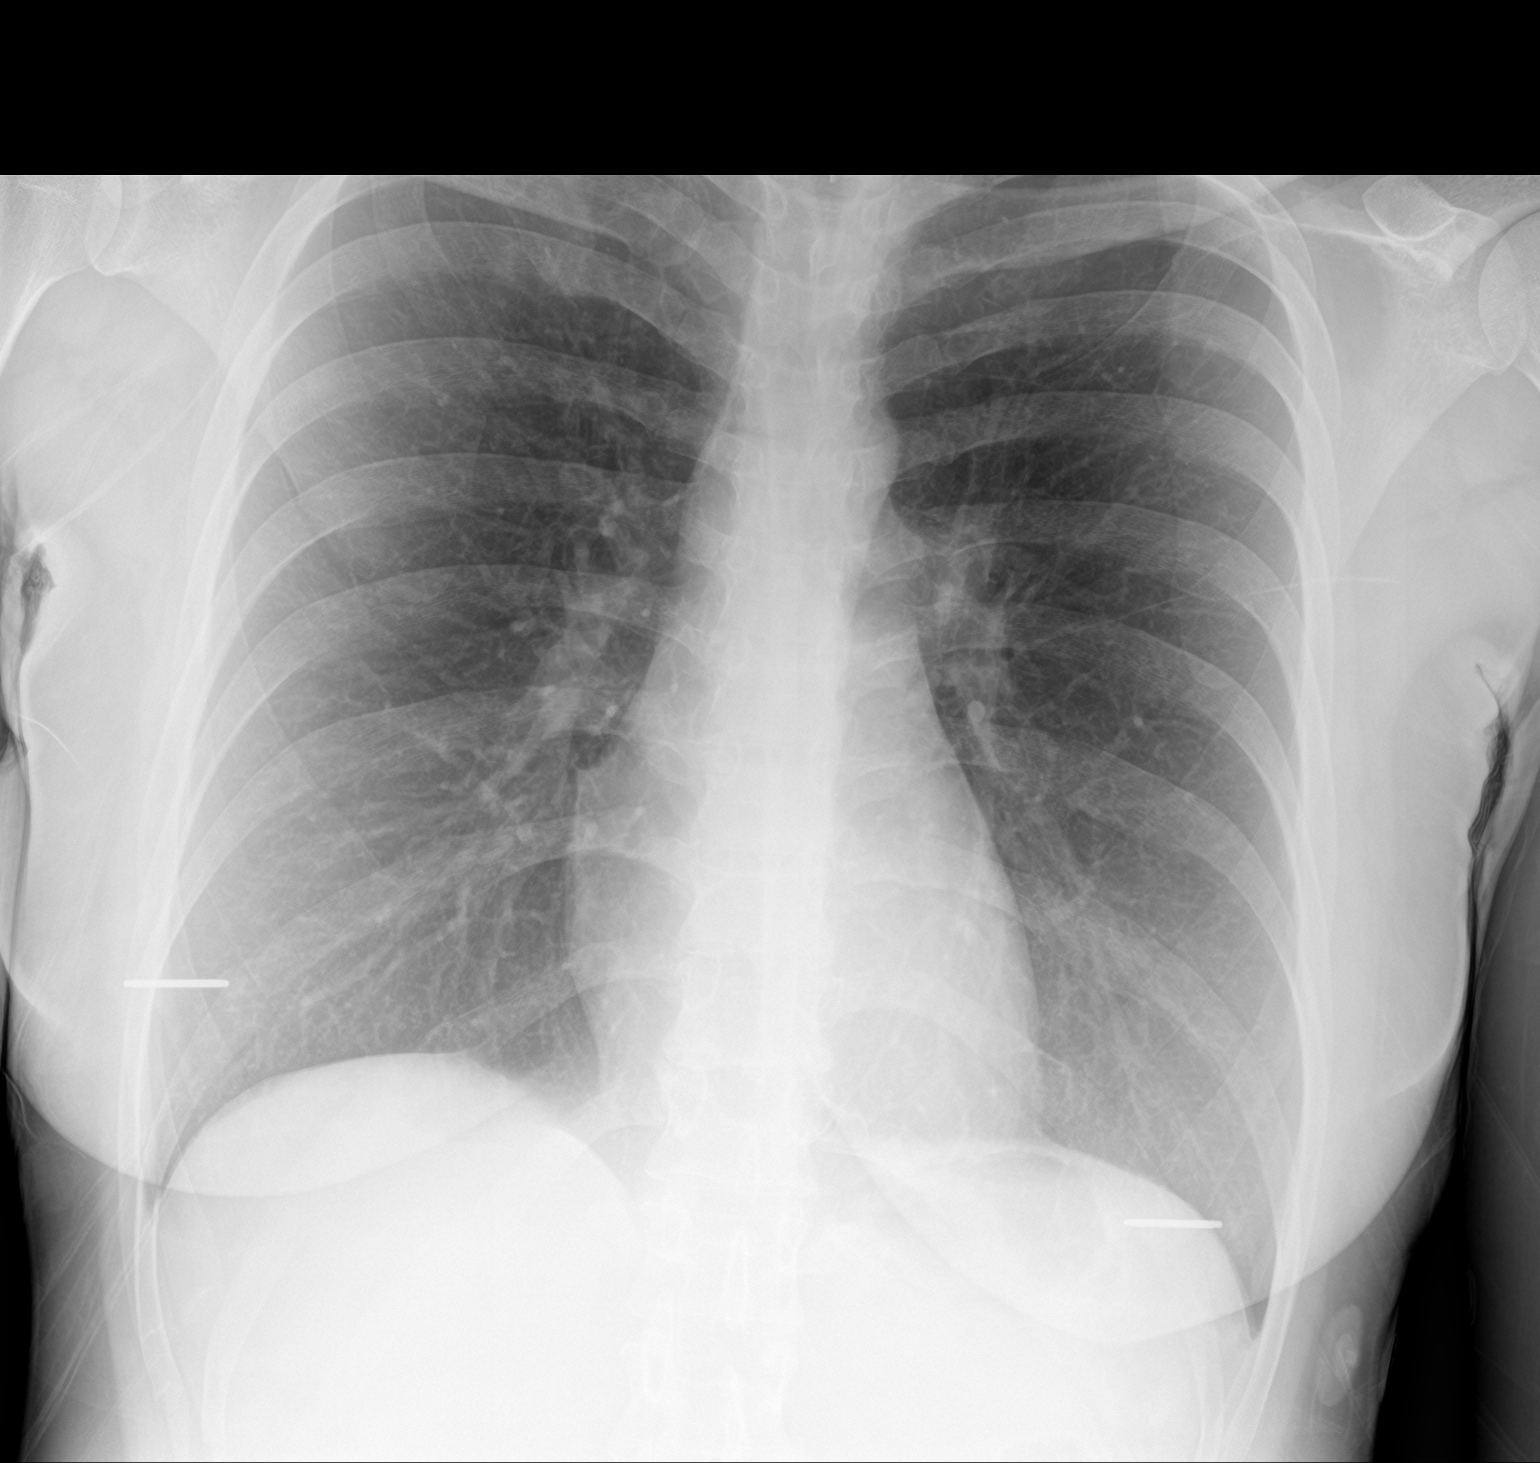

[2 of 2 positions shown; findings below may reference images not displayed]

FINDINGS: The heart, hila, and mediastinum are normal. No pneumothorax. No
nodules or masses. No focal infiltrates.
IMPRESSION: No active cardiopulmonary disease.

## 2021-07-30 ENCOUNTER — Other Ambulatory Visit: Payer: Self-pay

## 2021-07-30 ENCOUNTER — Inpatient Hospital Stay (HOSPITAL_COMMUNITY)
Admission: AD | Admit: 2021-07-30 | Discharge: 2021-08-02 | DRG: 833 | Disposition: A | Discharge status: Home / Self Care | Payer: Medicaid Other | Attending: Obstetrics and Gynecology | Admitting: Obstetrics and Gynecology

## 2021-07-30 ENCOUNTER — Encounter (HOSPITAL_COMMUNITY): Payer: Self-pay | Admitting: Obstetrics and Gynecology

## 2021-07-30 DIAGNOSIS — O210 Mild hyperemesis gravidarum: Principal | ICD-10-CM

## 2021-07-30 DIAGNOSIS — Z3A20 20 weeks gestation of pregnancy: Secondary | ICD-10-CM | POA: Diagnosis not present

## 2021-07-30 DIAGNOSIS — O211 Hyperemesis gravidarum with metabolic disturbance: Secondary | ICD-10-CM | POA: Diagnosis present

## 2021-07-30 DIAGNOSIS — F419 Anxiety disorder, unspecified: Secondary | ICD-10-CM | POA: Diagnosis present

## 2021-07-30 DIAGNOSIS — O99342 Other mental disorders complicating pregnancy, second trimester: Secondary | ICD-10-CM | POA: Diagnosis present

## 2021-07-30 LAB — TYPE AND SCREEN
ABO/RH(D): O POS
Antibody Screen: NEGATIVE

## 2021-07-30 LAB — COMPREHENSIVE METABOLIC PANEL
ALT: 51 U/L — ABNORMAL HIGH (ref 0–44)
AST: 28 U/L (ref 15–41)
Albumin: 3.7 g/dL (ref 3.5–5.0)
Alkaline Phosphatase: 76 U/L (ref 38–126)
Anion gap: 11 (ref 5–15)
BUN: 9 mg/dL (ref 6–20)
CO2: 22 mmol/L (ref 22–32)
Calcium: 9.4 mg/dL (ref 8.9–10.3)
Chloride: 103 mmol/L (ref 98–111)
Creatinine, Ser: 0.57 mg/dL (ref 0.44–1.00)
GFR, Estimated: >60 mL/min (ref >60)
Glucose, Bld: 92 mg/dL (ref 70–99)
Potassium: 2.7 mmol/L — CL (ref 3.5–5.1)
Sodium: 136 mmol/L (ref 135–145)
Total Bilirubin: 0.8 mg/dL (ref 0.3–1.2)
Total Protein: 7.3 g/dL (ref 6.5–8.1)

## 2021-07-30 LAB — CBC
HCT: 35.3 % — ABNORMAL LOW (ref 36.0–46.0)
Hemoglobin: 12.7 g/dL (ref 12.0–15.0)
MCH: 28.6 pg (ref 26.0–34.0)
MCHC: 36 g/dL (ref 30.0–36.0)
MCV: 79.5 fL — ABNORMAL LOW (ref 80.0–100.0)
Platelets: 347 10*3/uL (ref 150–400)
RBC: 4.44 MIL/uL (ref 3.87–5.11)
RDW: 12.2 % (ref 11.5–15.5)
WBC: 12.1 10*3/uL — ABNORMAL HIGH (ref 4.0–10.5)
nRBC: 0 % (ref 0.0–0.2)

## 2021-07-30 LAB — T4, FREE: Free T4: 0.74 ng/dL (ref 0.61–1.12)

## 2021-07-30 LAB — LIPASE, BLOOD: Lipase: 30 U/L (ref 11–51)

## 2021-07-30 LAB — TSH: TSH: 0.771 u[IU]/mL (ref 0.350–4.500)

## 2021-07-30 MED ORDER — METOCLOPRAMIDE HCL 10 MG PO TABS: 10.0000 mg | ORAL_TABLET | Freq: Four times a day (QID) | ORAL | Status: DC | PRN

## 2021-07-30 MED ORDER — ZOLPIDEM TARTRATE 5 MG PO TABS: 5.0000 mg | ORAL_TABLET | Freq: Every evening | ORAL | Status: DC | PRN

## 2021-07-30 MED ORDER — NITROFURANTOIN MONOHYD MACRO 100 MG PO CAPS
100.0000 mg | ORAL_CAPSULE | Freq: Every day | ORAL | Status: DC
  Administered 2021-07-31 – 2021-08-02 (×4): 100 mg via ORAL
  Filled 2021-07-30 (×6): qty 1

## 2021-07-30 MED ORDER — PRENATAL MULTIVITAMIN CH
1.0000 | ORAL_TABLET | Freq: Every day | ORAL | Status: DC
  Administered 2021-07-31 – 2021-08-02 (×2): 1 via ORAL
  Filled 2021-07-30 (×3): qty 1

## 2021-07-30 MED ORDER — POTASSIUM CHLORIDE 10 MEQ/100ML IV SOLN
10.0000 meq | INTRAVENOUS | Status: AC
  Administered 2021-07-30 – 2021-07-31 (×3): 10 meq via INTRAVENOUS
  Filled 2021-07-30 (×3): qty 100

## 2021-07-30 MED ORDER — CALCIUM CARBONATE ANTACID 500 MG PO CHEW
2.0000 | CHEWABLE_TABLET | ORAL | Status: DC | PRN
Start: 2021-07-30 — End: 2021-08-02

## 2021-07-30 MED ORDER — SODIUM CHLORIDE 0.9 % IV SOLN
25.0000 mg | Freq: Four times a day (QID) | INTRAVENOUS | Status: DC
  Administered 2021-07-30 – 2021-08-01 (×7): 25 mg via INTRAVENOUS
  Filled 2021-07-30 (×3): qty 1
  Filled 2021-07-30: qty 25
  Filled 2021-07-30 (×2): qty 1
  Filled 2021-07-30 (×2): qty 25
  Filled 2021-07-30 (×2): qty 1

## 2021-07-30 MED ORDER — DOCUSATE SODIUM 100 MG PO CAPS
100.0000 mg | ORAL_CAPSULE | Freq: Every day | ORAL | Status: DC
  Administered 2021-08-02: 100 mg via ORAL
  Filled 2021-07-30 (×2): qty 1

## 2021-07-30 MED ORDER — ACETAMINOPHEN 325 MG PO TABS: 650.0000 mg | ORAL_TABLET | ORAL | Status: DC | PRN

## 2021-07-30 MED ORDER — SCOPOLAMINE 1 MG/3DAYS TD PT72
1.0000 | MEDICATED_PATCH | TRANSDERMAL | Status: DC
  Administered 2021-07-30: 1.5 mg via TRANSDERMAL
  Filled 2021-07-30: qty 1

## 2021-07-30 MED ORDER — FAMOTIDINE 20 MG PO TABS
20.0000 mg | ORAL_TABLET | Freq: Every day | ORAL | Status: DC
  Administered 2021-07-30 – 2021-08-02 (×4): 20 mg via ORAL
  Filled 2021-07-30 (×4): qty 1

## 2021-07-30 MED ORDER — LACTATED RINGERS IV BOLUS
1000.0000 mL | Freq: Once | INTRAVENOUS | Status: AC
  Administered 2021-07-30: 1000 mL via INTRAVENOUS

## 2021-07-30 MED ORDER — ONDANSETRON HCL 4 MG/2ML IJ SOLN
4.0000 mg | Freq: Three times a day (TID) | INTRAMUSCULAR | Status: DC
  Administered 2021-07-30 – 2021-08-01 (×6): 4 mg via INTRAVENOUS
  Filled 2021-07-30 (×7): qty 2

## 2021-07-30 MED ORDER — LACTATED RINGERS IV SOLN: INTRAVENOUS | Status: DC

## 2021-07-30 NOTE — Progress Notes (Signed)
Date and time results received: 07/30/21 2235 ? ? ?Test: CMP ?Critical Value: K+ 2.7 ? ?Name of Provider Notified: Rhea Pink, CNM ? ?Orders Received? Or Actions Taken?: [place], CNM to put in orders ? ? ?Quincy Simmonds, RN ?

## 2021-07-30 NOTE — H&P (Signed)
HPI: 26 y.o. G1P0000 @ [redacted]w[redacted]d estimated gestational age (as dated by LMP c/w 7 week ultrasound) presents for inpatient management of hyperemesis gravidarum.  ? ?Patient has struggled with N/V this entire pregnancy but is unable to keep any food down without vomiting. Has difficulty drinking water and staying hydrated. Currently staying with her mother and is unable to go to work. Denies epigastric pain or heartburn. She currently takes Zofran 8mg  q8h, Phenergan 25mg  q6h, and Scopolamine patch. Has had a total of 15-20lb weight loss this pregnancy. ? ?Leakage of fluid:  No ?Vaginal bleeding:  No ?Contractions:  No ?Fetal movement:  20 weeks ? ?Prenatal care has been provided by Dr. [facility]). ? ?ROS:  Denies fevers, chills, chest pain, visual changes, SOB, RUQ/epigastric pain, N/V, dysuria, hematuria, or sudden onset/worsening bilateral LE or facial edema. ? ?Pregnancy complicated by: ?Hx A.fib (required cardioversion in 2021, previously anticoagulated with Eliquis x 1 month and previously on Metorpolol 25mg  daily, last Echo 60-65% EF, cleared by Cardiology) ?UTI x 2 this pregnancy (s/p treatment, unable to obtain Macrobid daily suppression) ?Asthma (no medications, asymptomatic) ?Anxiety (no medications, mood stable) ?SCT ?Hx MJ use ?History of pelvic fracture ?History of abnormal LFTs ? ?Prenatal Transfer Tool  ?Maternal Diabetes: No ?Genetic Screening: Normal ?Maternal Ultrasounds/Referrals: Normal ?Fetal Ultrasounds or other Referrals:  None ?Maternal Substance Abuse:  No ?Significant Maternal Medications:  None ?Significant Maternal Lab Results: None ? ? ?Prenatal Labs ?Blood type:  O Positive ?Antibody screen:  Negative ?CBC:  H/H 12/38 ?Rubella: Immune ?RPR:  Non-reactive ?Hep B:  Negative ?Hep C:  Negative ?HIV:  Negative ?GC/CT:  Negative ?Glucola:  Has not had yet ? ?Immunizations: ?Tdap: Has not had yet ?Flu: Received 06/27/21 ? ?OBHx:  ?OB History   ? ? Gravida  ?0  ? Para  ?0  ? Term  ?0   ? Preterm  ?0  ? AB  ?0  ? Living  ?0  ?  ? ? SAB  ?0  ? IAB  ?0  ? Ectopic  ?0  ? Multiple  ?0  ? Live Births  ?   ?   ?  ?  ? ?PMHx:  See above ?Meds:  PNV, Zofran, Phenergan, Scopolamine patch ?Allergy:   ?Allergies  ?Allergen Reactions  ? Contrast Media [Iodinated Contrast Media] Hives and Other (See Comments)  ?  Warm feeling in back of throat 10 minutes after contrast  ? Ibuprofen Nausea And Vomiting  ? ?SurgHx: Diagnostic laparoscopy, bilateral feet surgery ?SocHx:   Denies Tobacco, ETOH, illicit drugs ? ?O: There were no vitals taken for this visit. ?Gen. AAOx3, NAD, tearful ?CV.  RRR  ?Resp. CTAB, no wheezes/rales/rhonchi ?Abd. Gravid, soft, non-tender throughout, no rebound/guarding ?Extr.  No bilateral LE edema, no calf tenderness bilaterally ? ?Last :  07/30/2021 [redacted]w[redacted]d EFW 337g (32%), AAFV, Cephalic, CL 3.9 cm, anterior placenta, all anatomy visualized and appears normal ? ?Labs: see orders ? ?A/P:  26 y.o. G1P0000 @ [redacted]w[redacted]d who is admitted for hyperemesis gravidarum. ? ?- Admit to [facility] ?- Admit labs: CBC, CMP, Amylase/Lipase, TSH, FT4 ?- Fetal heart tones daily ?- Diet:  As tolerated ?- IVF:  LR 1L bolus followed by 125cc/hour ?- Antiemetics: Zofran 8mg  IV q8h, Phenergan 25mg  IV q6h, Reglan 10mg  PO q6h PRN, Scopolamine patch ?- GI: Pepcid 20mg  PO daily ?- VTE Prophylaxis:  SCDs ?- Will monitor inpatient and transition back to PO antiemetics ? ?UTI x 2 this pregnancy ?- S/p treatment x 2,  has not obtained daily suppression ?- Macrobid 100mg  daily ordered ? ? ? , DO ?[phone] (office) ? ? ? ? ? ?

## 2021-07-31 LAB — COMPREHENSIVE METABOLIC PANEL
ALT: 36 U/L (ref 0–44)
AST: 23 U/L (ref 15–41)
Albumin: 2.6 g/dL — ABNORMAL LOW (ref 3.5–5.0)
Alkaline Phosphatase: 62 U/L (ref 38–126)
Anion gap: 6 (ref 5–15)
BUN: 6 mg/dL (ref 6–20)
CO2: 25 mmol/L (ref 22–32)
Calcium: 8.4 mg/dL — ABNORMAL LOW (ref 8.9–10.3)
Chloride: 106 mmol/L (ref 98–111)
Creatinine, Ser: 0.58 mg/dL (ref 0.44–1.00)
GFR, Estimated: >60 mL/min (ref >60)
Glucose, Bld: 119 mg/dL — ABNORMAL HIGH (ref 70–99)
Potassium: 3.3 mmol/L — ABNORMAL LOW (ref 3.5–5.1)
Sodium: 137 mmol/L (ref 135–145)
Total Bilirubin: 0.3 mg/dL (ref 0.3–1.2)
Total Protein: 5.3 g/dL — ABNORMAL LOW (ref 6.5–8.1)

## 2021-07-31 LAB — URINALYSIS, ROUTINE W REFLEX MICROSCOPIC
Bilirubin Urine: NEGATIVE
Glucose, UA: NEGATIVE mg/dL
Hgb urine dipstick: NEGATIVE
Ketones, ur: NEGATIVE mg/dL
Leukocytes,Ua: NEGATIVE
Nitrite: NEGATIVE
Protein, ur: NEGATIVE mg/dL
Specific Gravity, Urine: 1.01 (ref 1.005–1.030)
pH: 6.5 (ref 5.0–8.0)

## 2021-07-31 MED ORDER — POTASSIUM CHLORIDE CRYS ER 20 MEQ PO TBCR
20.0000 meq | EXTENDED_RELEASE_TABLET | Freq: Every day | ORAL | Status: DC
  Administered 2021-07-31 – 2021-08-01 (×2): 20 meq via ORAL
  Filled 2021-07-31 (×2): qty 1

## 2021-07-31 NOTE — Progress Notes (Signed)
Joyce Franco is a 26 y.o. female G1P0 at 20 weeks and 4 days admitted for management of hyperemesis  gravidarum.   ? ?Subjective: pt reports nausea she had an episode of emesis this morning. Since that episode she has been able to tolerate bacon for breakfast. She had 1/3 of a hamburger for lunch.  ? ?Vitals:  ? 07/31/21 1544 07/31/21 1545 07/31/21 1705 07/31/21 1952  ?BP: 112/60 (!) 115/49  (!) 125/58  ?Pulse: 98 100  98  ?Resp:  17  18  ?Temp:  99.5 ?F (37.5 ?C) 99.8 ?F (37.7 ?C) 99.4 ?F (37.4 ?C)  ?TempSrc:  Oral Oral Oral  ?SpO2:  98%  100%  ?Weight:      ?Height:      ?  ? ?General Alert and oriented NAD  ?Lungs No respiratory distress  ?Abdomen soft gravid nontender  ?Extremities no evidence of DVT ? ?Results for orders placed or performed during the hospital encounter of 07/30/21 (from the past 24 hour(s))  ?Type and screen [facility]     Status: None  ? Collection Time: 07/30/21  9:15 PM  ?Result Value Ref Range  ? ABO/RH(D) O POS   ? Antibody Screen NEG   ? Sample Expiration    ?  08/02/2021,2359 ?Performed at [facility], [address], [place], [place] [address] ?  ?Comprehensive metabolic panel     Status: Abnormal  ? Collection Time: 07/30/21  9:26 PM  ?Result Value Ref Range  ? Sodium 136 135 - 145 mmol/L  ? Potassium 2.7 (LL) 3.5 - 5.1 mmol/L  ? Chloride 103 98 - 111 mmol/L  ? CO2 22 22 - 32 mmol/L  ? Glucose, Bld 92 70 - 99 mg/dL  ? BUN 9 6 - 20 mg/dL  ? Creatinine, Ser 0.57 0.44 - 1.00 mg/dL  ? Calcium 9.4 8.9 - 10.3 mg/dL  ? Total Protein 7.3 6.5 - 8.1 g/dL  ? Albumin 3.7 3.5 - 5.0 g/dL  ? AST 28 15 - 41 U/L  ? ALT 51 (H) 0 - 44 U/L  ? Alkaline Phosphatase 76 38 - 126 U/L  ? Total Bilirubin 0.8 0.3 - 1.2 mg/dL  ? GFR, Estimated >60 >60 mL/min  ? Anion gap 11 5 - 15  ?Lipase, blood     Status: None  ? Collection Time: 07/30/21  9:26 PM  ?Result Value Ref Range  ? Lipase 30 11 - 51 U/L  ?CBC on admission     Status: Abnormal  ? Collection Time: 07/30/21  9:26 PM  ?Result  Value Ref Range  ? WBC 12.1 (H) 4.0 - 10.5 K/uL  ? RBC 4.44 3.87 - 5.11 MIL/uL  ? Hemoglobin 12.7 12.0 - 15.0 g/dL  ? HCT 35.3 (L) 36.0 - 46.0 %  ? MCV 79.5 (L) 80.0 - 100.0 fL  ? MCH 28.6 26.0 - 34.0 pg  ? MCHC 36.0 30.0 - 36.0 g/dL  ? RDW 12.2 11.5 - 15.5 %  ? Platelets 347 150 - 400 K/uL  ? nRBC 0.0 0.0 - 0.2 %  ?T4, free     Status: None  ? Collection Time: 07/30/21  9:26 PM  ?Result Value Ref Range  ? Free T4 0.74 0.61 - 1.12 ng/dL  ?TSH     Status: None  ? Collection Time: 07/30/21  9:26 PM  ?Result Value Ref Range  ? TSH 0.771 0.350 - 4.500 uIU/mL  ?Comprehensive metabolic panel     Status: Abnormal  ?  Collection Time: 07/31/21  4:48 AM  ?Result Value Ref Range  ? Sodium 137 135 - 145 mmol/L  ? Potassium 3.3 (L) 3.5 - 5.1 mmol/L  ? Chloride 106 98 - 111 mmol/L  ? CO2 25 22 - 32 mmol/L  ? Glucose, Bld 119 (H) 70 - 99 mg/dL  ? BUN 6 6 - 20 mg/dL  ? Creatinine, Ser 0.58 0.44 - 1.00 mg/dL  ? Calcium 8.4 (L) 8.9 - 10.3 mg/dL  ? Total Protein 5.3 (L) 6.5 - 8.1 g/dL  ? Albumin 2.6 (L) 3.5 - 5.0 g/dL  ? AST 23 15 - 41 U/L  ? ALT 36 0 - 44 U/L  ? Alkaline Phosphatase 62 38 - 126 U/L  ? Total Bilirubin 0.3 0.3 - 1.2 mg/dL  ? GFR, Estimated >60 >60 mL/min  ? Anion gap 6 5 - 15  ?Urinalysis, Routine w reflex microscopic     Status: None  ? Collection Time: 07/31/21  8:13 AM  ?Result Value Ref Range  ? Color, Urine YELLOW YELLOW  ? APPearance CLEAR CLEAR  ? Specific Gravity, Urine 1.010 1.005 - 1.030  ? pH 6.5 5.0 - 8.0  ? Glucose, UA NEGATIVE NEGATIVE mg/dL  ? Hgb urine dipstick NEGATIVE NEGATIVE  ? Bilirubin Urine NEGATIVE NEGATIVE  ? Ketones, ur NEGATIVE NEGATIVE mg/dL  ? Protein, ur NEGATIVE NEGATIVE mg/dL  ? Nitrite NEGATIVE NEGATIVE  ? Leukocytes,Ua NEGATIVE NEGATIVE  ?  ? ?A/P:  26 y.o. G1P0000 @ [redacted]w[redacted]d who is admitted for hyperemesis gravidarum. ?  ?- Fetal heart tones daily ?- Diet:  As tolerated ?- IVF:  LR 1L bolus followed by 125cc/hour ?- Antiemetics: Zofran 8mg  IV q8h, Phenergan 25mg  IV q6h, Reglan 10mg  PO  q6h PRN, Scopolamine patch ?- GI: Pepcid 20mg  PO daily ?- VTE Prophylaxis:  SCDs ?- Will monitor inpatient and transition back to PO antiemetics ?  ?UTI x 2 this pregnancy ?- S/p treatment x 2, has not obtained daily suppression ?- Macrobid 100mg  daily ordered ? ?Hypokalemia- replaced with IV KCL 10 meq x 3... improved will add Kdur 20 meq daily  ? ?

## 2021-08-01 LAB — COMPREHENSIVE METABOLIC PANEL
ALT: 33 U/L (ref 0–44)
AST: 22 U/L (ref 15–41)
Albumin: 2.6 g/dL — ABNORMAL LOW (ref 3.5–5.0)
Alkaline Phosphatase: 56 U/L (ref 38–126)
Anion gap: 5 (ref 5–15)
BUN: 5 mg/dL — ABNORMAL LOW (ref 6–20)
CO2: 22 mmol/L (ref 22–32)
Calcium: 8.6 mg/dL — ABNORMAL LOW (ref 8.9–10.3)
Chloride: 110 mmol/L (ref 98–111)
Creatinine, Ser: 0.54 mg/dL (ref 0.44–1.00)
GFR, Estimated: >60 mL/min (ref >60)
Glucose, Bld: 89 mg/dL (ref 70–99)
Potassium: 3.8 mmol/L (ref 3.5–5.1)
Sodium: 137 mmol/L (ref 135–145)
Total Bilirubin: 0.3 mg/dL (ref 0.3–1.2)
Total Protein: 5.2 g/dL — ABNORMAL LOW (ref 6.5–8.1)

## 2021-08-01 LAB — CULTURE, OB URINE

## 2021-08-01 MED ORDER — PROMETHAZINE HCL 25 MG PO TABS
25.0000 mg | ORAL_TABLET | Freq: Four times a day (QID) | ORAL | Status: DC
  Administered 2021-08-01 – 2021-08-02 (×4): 25 mg via ORAL
  Filled 2021-08-01 (×4): qty 1

## 2021-08-01 MED ORDER — ONDANSETRON 4 MG PO TBDP
8.0000 mg | ORAL_TABLET | Freq: Three times a day (TID) | ORAL | Status: DC
  Administered 2021-08-01 – 2021-08-02 (×3): 8 mg via ORAL
  Filled 2021-08-01 (×3): qty 2

## 2021-08-01 NOTE — Progress Notes (Signed)
Joyce Franco is a 26 y.o. female G1P0 at 20 weeks and 5 days admitted for management of hyperemesis  gravidarum.   ? ?Subjective: Reports feeling weak this morning. Concerned about her previously rising temperatures. Vomited twice this admission. She was able to eat bacon and 1/3 of a burger yesterday. Concerned about return of severe N/V after transitioning to PO medications. ? ?Denies fevers, chills, chest pain, visual changes, SOB, RUQ/epigastric pain, N/V, dysuria, hematuria, or sudden onset/worsening bilateral LE or facial edema. ?  ?Objective: ?Vitals:  ? 07/31/21 1705 07/31/21 1952 07/31/21 1953 07/31/21 2345  ?BP:  (!) 125/58  (!) 102/56  ?Pulse:  98 99 86  ?Resp:  18  18  ?Temp: 99.8 ?F (37.7 ?C) 99.4 ?F (37.4 ?C)  98.9 ?F (37.2 ?C)  ?TempSrc: Oral Oral  Oral  ?SpO2:  100%  100%  ?Weight:      ?Height:      ? ?Gen:  NAD, pleasant and cooperative ?Cardio:  RRR ?Pulm:  CTAB, no wheezes/rales/rhonchi ?Abd:  Soft, gravid, non-distended, non-tender throughout, no rebound/guarding ?Ext:  No bilateral LE edema, no bilateral calf tenderness ?MSK: No CVA tenderness ? ?Results for orders placed or performed during the hospital encounter of 07/30/21 (from the past 24 hour(s))  ?Urinalysis, Routine w reflex microscopic     Status: None  ? Collection Time: 07/31/21  8:13 AM  ?Result Value Ref Range  ? Color, Urine YELLOW YELLOW  ? APPearance CLEAR CLEAR  ? Specific Gravity, Urine 1.010 1.005 - 1.030  ? pH 6.5 5.0 - 8.0  ? Glucose, UA NEGATIVE NEGATIVE mg/dL  ? Hgb urine dipstick NEGATIVE NEGATIVE  ? Bilirubin Urine NEGATIVE NEGATIVE  ? Ketones, ur NEGATIVE NEGATIVE mg/dL  ? Protein, ur NEGATIVE NEGATIVE mg/dL  ? Nitrite NEGATIVE NEGATIVE  ? Leukocytes,Ua NEGATIVE NEGATIVE  ?  ? ?A/P:  26 y.o. G1P0000 @ [redacted]w[redacted]d who is admitted for hyperemesis gravidarum. ?  ?- Fetal heart tones daily ?- Diet:  As tolerated ?- IVF:  LR 1L bolus followed by 125cc/hour ?- Antiemetics: Zofran 8mg  IV q8h, Phenergan 25mg  IV q6h, Reglan 10mg  PO  q6h PRN, Scopolamine patch ?- GI: Pepcid 20mg  PO daily ?- VTE Prophylaxis:  SCDs ?- Will monitor inpatient and transition back to PO antiemetics ?  ?UTI x 2 this pregnancy ?- S/p treatment x 2, has not obtained daily suppression ?- Macrobid 100mg  daily ordered ? ?Hypokalemia ?- Replaced with IV KCL 10 meq x 3 ?- Kdur 20 meq daily ordered ? ?Disposition: Likely will still need inpatient support today and will be ready for D/C home either tonight or tomorrow morning. ? ?Drema Dallas, DO ? ?

## 2021-08-02 DIAGNOSIS — O99342 Other mental disorders complicating pregnancy, second trimester: Secondary | ICD-10-CM | POA: Diagnosis present

## 2021-08-02 DIAGNOSIS — O210 Mild hyperemesis gravidarum: Secondary | ICD-10-CM | POA: Diagnosis present

## 2021-08-02 DIAGNOSIS — O211 Hyperemesis gravidarum with metabolic disturbance: Secondary | ICD-10-CM | POA: Diagnosis present

## 2021-08-02 DIAGNOSIS — F419 Anxiety disorder, unspecified: Secondary | ICD-10-CM | POA: Diagnosis present

## 2021-08-02 DIAGNOSIS — Z3A20 20 weeks gestation of pregnancy: Secondary | ICD-10-CM | POA: Diagnosis not present

## 2021-08-02 MED ORDER — NITROFURANTOIN MONOHYD MACRO 100 MG PO CAPS: 100.0000 mg | ORAL_CAPSULE | Freq: Every day | ORAL | 2 refills | Status: DC

## 2021-08-02 MED ORDER — METOCLOPRAMIDE HCL 10 MG PO TABS: 10.0000 mg | ORAL_TABLET | Freq: Four times a day (QID) | ORAL | 3 refills | Status: DC | PRN

## 2021-08-02 MED ORDER — ONDANSETRON 8 MG PO TBDP: 8.0000 mg | ORAL_TABLET | Freq: Three times a day (TID) | ORAL | 3 refills | Status: DC | PRN

## 2021-08-02 MED ORDER — SCOPOLAMINE 1 MG/3DAYS TD PT72: 1.0000 | MEDICATED_PATCH | TRANSDERMAL | 12 refills | Status: DC

## 2021-08-02 MED ORDER — FAMOTIDINE 20 MG PO TABS: 20.0000 mg | ORAL_TABLET | Freq: Every day | ORAL | 2 refills | Status: DC

## 2021-08-02 MED ORDER — PROMETHAZINE HCL 25 MG PO TABS: 25.0000 mg | ORAL_TABLET | Freq: Four times a day (QID) | ORAL | 3 refills | Status: DC | PRN

## 2021-08-02 NOTE — Discharge Summary (Signed)
Physician Discharge Summary  ?Patient ID: ?Joyce Franco ?MRN: [id number] ?DOB/AGE: 1996/02/27 26 y.o. ? ?Admit date: 07/30/2021 ?Discharge date: 08/02/2021 ? ?Admission Diagnoses: ?Hyperemesis gravidarum ? ?Discharge Diagnoses:  ?Principal Problem: ?  Hyperemesis gravidarum ?Active Problems: ?  Anxiety ?Hypokalemia (resolved) ? ?Discharged Condition: good ? ?Hospital Course: Patient is a G1P0000 at [redacted]w[redacted]d who was admitted on 07/30/21 at [redacted]w[redacted]d for hyperemesis gravidarum. Patient has struggled with N/V this entire pregnancy but is unable to keep any food down without vomiting. Has difficulty drinking water and staying hydrated. Currently staying with her mother and is unable to go to work. Denies epigastric pain or heartburn. She currently takes Zofran 8mg  q8h, Phenergan 25mg  q6h, and Scopolamine patch. Has had a total of 15-20lb weight loss this pregnancy. ? ?She was admitted for IVF support and IV anti-emetics. On admission, it was noted patient had severe hypokalemia with a potassium level of 2.7. She was given given three doses of IV potassium supplementation and PO replacement as well. Her potassium normalized to 3.8 prior to discharge. She was transitioned to PO anti-emetics with ability to tolerate PO without emesis. She had an EKG that showed normal sinus rhythm due to her anti-emetic regimen (to rule out prolonged Qtc) and given her history of A. Fib. She was started on daily Macrobid due to history of two UTIs this pregnancy. Patient was found to be stable for discharge home. ? ?Consults: None ? ?Significant Diagnostic Studies: labs:  ? ?  Latest Ref Rng & Units 07/30/2021  ?  9:26 PM 04/12/2021  ?  2:10 PM 03/09/2021  ?  3:39 PM  ?CBC  ?WBC 4.0 - 10.5 K/uL 12.1   11.4   9.6    ?Hemoglobin 12.0 - 15.0 g/dL 12.7   14.7   13.7    ?Hematocrit 36.0 - 46.0 % 35.3   42.3   39.4    ?Platelets 150 - 400 K/uL 347   354   307    ? ? ?  Latest Ref Rng & Units 08/01/2021  ?  8:54 AM 07/31/2021  ?  4:48 AM 07/30/2021  ?  9:26 PM   ?CMP  ?Glucose 70 - 99 mg/dL 89   119   92    ?BUN 6 - 20 mg/dL 5   6   9     ?Creatinine 0.44 - 1.00 mg/dL 0.54   0.58   0.57    ?Sodium 135 - 145 mmol/L 137   137   136    ?Potassium 3.5 - 5.1 mmol/L 3.8   3.3   2.7    ?Chloride 98 - 111 mmol/L 110   106   103    ?CO2 22 - 32 mmol/L 22   25   22     ?Calcium 8.9 - 10.3 mg/dL 8.6   8.4   9.4    ?Total Protein 6.5 - 8.1 g/dL 5.2   5.3   7.3    ?Total Bilirubin 0.3 - 1.2 mg/dL 0.3   0.3   0.8    ?Alkaline Phos 38 - 126 U/L 56   62   76    ?AST 15 - 41 U/L 22   23   28     ?ALT 0 - 44 U/L 33   36   51    ? ?TSH: 0.771 (wnl) ?FT4: 0.74 (wnl) ?Lipase: 30 ?  ?Treatments: IV hydration and Antiemetics, prophylactic antibiotics ? ?Discharge Exam: ?Blood pressure 126/75, pulse 91, temperature 99.1 ?F (37.3 ?C), temperature  source Oral, resp. rate 17, height 5\' 5"  (1.651 m), weight 69.9 kg, last menstrual period 03/18/2021, SpO2 99 %. ?Gen:  NAD, pleasant and cooperative ?Cardio:  RRR ?Pulm:  CTAB, no wheezes/rales/rhonchi ?Abd:  Soft, gravid, non-distended, non-tender throughout, no rebound/guarding ?Ext:  No bilateral LE edema, no bilateral calf tenderness ?  ? ?Disposition: Discharge disposition: 01-Home or Self Care ? ? ? ? ? ? ? ?Allergies as of 08/02/2021   ? ?   Reactions  ? Contrast Media [iodinated Contrast Media] Hives, Other (See Comments)  ? Warm feeling in back of throat 10 minutes after contrast  ? Ibuprofen Nausea And Vomiting  ? ?  ? ?  ?Medication List  ?  ? ?TAKE these medications   ? ?doxylamine (Sleep) 25 MG tablet ?Commonly known as: UNISOM ?Take 25 mg by mouth at bedtime as needed. ?  ?famotidine 20 MG tablet ?Commonly known as: PEPCID ?Take 1 tablet (20 mg total) by mouth daily. ?Start taking on: August 03, 2021 ?  ?metoCLOPramide 10 MG tablet ?Commonly known as: REGLAN ?Take 1 tablet (10 mg total) by mouth every 6 (six) hours as needed for nausea. ?  ?nitrofurantoin (macrocrystal-monohydrate) 100 MG capsule ?Commonly known as: MACROBID ?Take 1 capsule (100  mg total) by mouth daily. ?Start taking on: August 03, 2021 ?  ?ondansetron 8 MG disintegrating tablet ?Commonly known as: ZOFRAN-ODT ?Take 1 tablet (8 mg total) by mouth every 8 (eight) hours as needed for nausea, vomiting or refractory nausea / vomiting. ?What changed:  ?medication strength ?how much to take ?reasons to take this ?  ?Prenatal Vitamins 28-0.8 MG Tabs ?Take 1 tablet by mouth daily. ?  ?promethazine 25 MG tablet ?Commonly known as: PHENERGAN ?Take 1 tablet (25 mg total) by mouth every 6 (six) hours as needed for nausea, vomiting or refractory nausea / vomiting. ?What changed: reasons to take this ?  ?scopolamine 1 MG/3DAYS ?Commonly known as: TRANSDERM-SCOP ?Place 1 patch onto the skin every 3 (three) days. ?What changed: Another medication with the same name was added. Make sure you understand how and when to take each. ?  ?scopolamine 1 MG/3DAYS ?Commonly known as: TRANSDERM-SCOP ?Place 1 patch (1.5 mg total) onto the skin every 3 (three) days. ?What changed: You were already taking a medication with the same name, and this prescription was added. Make sure you understand how and when to take each. ?  ? ?  ? ? Follow-up Information   ? ? Drema Dallas, DO Follow up in 1 week(s).   ?Specialty: Obstetrics and Gynecology ?Why: Our office will arrange a 1-2 week hospital follow-up visit for you. ?Contact information: ?[address] ?[address] ?[place] [address] ?[phone] ? ? ?  ?  ? ?  ?  ? ?  ? ? ?Signed: ?Drema Dallas ?08/02/2021, 11:08 AM ? ? ?

## 2021-08-02 NOTE — Progress Notes (Signed)
[name] is a 26 y.o. female G1P0 at 20 weeks and 5 days admitted for management of hyperemesis  gravidarum.   ? ?Subjective: Reports feeling okay. She ate some bacon and eggs but unable to eat pancakes. She has not vomited her breakfast. Doing okay with transition back to PO anti-emetics but does feel "dry" after IVFs were discontinued. Feels ready for discharge home today and knows to call if she has any issues. ? ?Denies fevers, chills, chest pain, visual changes, SOB, RUQ/epigastric pain, N/V, dysuria, hematuria, or sudden onset/worsening bilateral LE or facial edema. ?  ?Objective: ?Vitals:  ? 08/01/21 1952 08/01/21 2220 08/02/21 0532 08/02/21 0830  ?BP: 111/62 125/76 (!) 106/59 126/75  ?Pulse: 95 88 83 91  ?Resp: 16 16 14 17   ?Temp: 98.6 ?F (37 ?C) 98.9 ?F (37.2 ?C) 98.7 ?F (37.1 ?C) 99.1 ?F (37.3 ?C)  ?TempSrc: Oral Oral Oral Oral  ?SpO2: 98% 98% 100% 99%  ?Weight:      ?Height:      ? ?Gen:  NAD, pleasant and cooperative ?Cardio:  RRR ?Pulm:  CTAB, no wheezes/rales/rhonchi ?Abd:  Soft, gravid, non-distended, non-tender throughout, no rebound/guarding ?Ext:  No bilateral LE edema, no bilateral calf tenderness ? ?No results found for this or any previous visit (from the past 24 hour(s)). ?  ? ?A/P:  26 y.o. G1P0000 @ [redacted]w[redacted]d who is admitted for hyperemesis gravidarum. ?  ?- Fetal heart tones daily ?- Diet:  Regular ?- IVF:  SLIV ?- Antiemetics: Zofran 8mg  PO q8h, Phenergan 25mg  PO q6h, Reglan 10mg  PO q6h PRN, Scopolamine patch ?- GI: Pepcid 20mg  PO daily ?- VTE Prophylaxis:  SCDs ?  ?UTI x 2 this pregnancy ?- S/p treatment x 2, has not obtained daily suppression ?- Urine culture shows multiple species, will re-collect outpatient ?- Macrobid 100mg  daily ordered ? ?Hypokalemia ?- Replaced with IV KCL 10 meq x 3 ?- Normalized, K+ 3.8 yesterday ? ?History of A.fib ?- EKG performed yesterday to rule out Qtc prolongation given current anti-emetic regimen and history of A. Fib ?- NSR yesterday ? ?Disposition: D/C  home today on PO anti-emetics and Pepcid. Will arrange 1-2 week outpatient follow-up. ? ?[redacted]w[redacted]d, DO ? ?

## 2021-09-13 ENCOUNTER — Other Ambulatory Visit: Payer: Self-pay

## 2021-09-13 ENCOUNTER — Inpatient Hospital Stay (HOSPITAL_COMMUNITY)
Admission: AD | Admit: 2021-09-13 | Discharge: 2021-09-14 | Disposition: A | Discharge status: Home / Self Care | Payer: Medicaid Other | Attending: Obstetrics & Gynecology | Admitting: Obstetrics & Gynecology

## 2021-09-13 ENCOUNTER — Encounter (HOSPITAL_COMMUNITY): Payer: Self-pay | Admitting: Obstetrics & Gynecology

## 2021-09-13 DIAGNOSIS — Z3A26 26 weeks gestation of pregnancy: Secondary | ICD-10-CM | POA: Insufficient documentation

## 2021-09-13 DIAGNOSIS — O210 Mild hyperemesis gravidarum: Secondary | ICD-10-CM | POA: Insufficient documentation

## 2021-09-13 HISTORY — DX: Mild hyperemesis gravidarum: O21.0

## 2021-09-13 HISTORY — DX: Headache, unspecified: R51.9

## 2021-09-13 LAB — URINALYSIS, ROUTINE W REFLEX MICROSCOPIC
Bilirubin Urine: NEGATIVE
Glucose, UA: NEGATIVE mg/dL
Hgb urine dipstick: NEGATIVE
Ketones, ur: NEGATIVE mg/dL
Leukocytes,Ua: NEGATIVE
Nitrite: NEGATIVE
Protein, ur: NEGATIVE mg/dL
Specific Gravity, Urine: 1.02 (ref 1.005–1.030)
pH: 6 (ref 5.0–8.0)

## 2021-09-13 LAB — COMPREHENSIVE METABOLIC PANEL
ALT: 33 U/L (ref 0–44)
AST: 27 U/L (ref 15–41)
Albumin: 2.8 g/dL — ABNORMAL LOW (ref 3.5–5.0)
Alkaline Phosphatase: 107 U/L (ref 38–126)
Anion gap: 6 (ref 5–15)
BUN: 6 mg/dL (ref 6–20)
CO2: 23 mmol/L (ref 22–32)
Calcium: 8.7 mg/dL — ABNORMAL LOW (ref 8.9–10.3)
Chloride: 106 mmol/L (ref 98–111)
Creatinine, Ser: 0.58 mg/dL (ref 0.44–1.00)
GFR, Estimated: >60 mL/min (ref >60)
Glucose, Bld: 83 mg/dL (ref 70–99)
Potassium: 3.5 mmol/L (ref 3.5–5.1)
Sodium: 135 mmol/L (ref 135–145)
Total Bilirubin: 0.5 mg/dL (ref 0.3–1.2)
Total Protein: 5.8 g/dL — ABNORMAL LOW (ref 6.5–8.1)

## 2021-09-13 MED ORDER — ONDANSETRON 4 MG PO TBDP
8.0000 mg | ORAL_TABLET | Freq: Once | ORAL | Status: AC
  Administered 2021-09-13: 8 mg via ORAL
  Filled 2021-09-13: qty 2

## 2021-09-13 NOTE — MAU Note (Signed)
Joyce Franco is a 26 y.o. at [redacted]w[redacted]d here in MAU reporting: "I have HG, was here a month ago and about died." Pt states her potassium was low. Reporting dehydration and vomiting. Takes Reglan, Pepcid, Macrobid, promethazine, and Zofran "every day, all day long". Pt states "it helps, but I've just been sick this week." "This week is has been every day". Denies VB or LOF. +FM. Pt reports lower abdominal pain that is constant and is more left sided.  ? ?Onset of complaint: all week  ?Pain score: 10 ?Vitals:  ? 09/13/21 1936  ?BP: 139/84  ?Pulse: (!) 102  ?Resp: 18  ?Temp: 98.9 ?F (37.2 ?C)  ?SpO2: 100%  ? ?   ?FHT:[id number] ?Lab orders placed from triage: u/a  ? ?

## 2021-09-13 NOTE — MAU Provider Note (Signed)
Patient Joyce Franco K Kakar is a 26 y.o. G1P0000 ? At 6377w6d here with complaints of nausea, vomiting that has not been controlled with her usual medicines. She denies vaginal discharge, vaginal bleeding; LOF of fluid. She denies any history of HTN/GHTN or GDM/DM. She reports active fetal movements.  ? ?She normally takes reglan, promethazine, pepcid and zofran as soon as she "opens her eyes' between 7-8. Every 4-6 hours she takes reglan, promethazine and zofran. She was out to eat when she took her medicine at 4812 again but couldn't eat. Then didn't eat this afternoon and then decided to come in this afternoon.  ? ?Patient reports that she has not lost weight but she has not gained weight either.  ?History  ?  ? ?CSN: [id number] ? ?Arrival date and time: 09/13/21 1921 ? ? Event Date/Time  ? First Provider Initiated Contact with Patient 09/13/21 2026   ?  ? ?Chief Complaint  ?Patient presents with  ? Emesis  ? ?Emesis  ?This is a chronic problem. The current episode started in the past 7 days. The problem occurs less than 2 times per day. There has been no fever. Pertinent negatives include no chest pain, chills, coughing, diarrhea or fever. Risk factors include ill contacts.  ? ?OB History   ? ? Gravida  ?1  ? Para  ?0  ? Term  ?0  ? Preterm  ?0  ? AB  ?0  ? Living  ?0  ?  ? ? SAB  ?0  ? IAB  ?0  ? Ectopic  ?0  ? Multiple  ?0  ? Live Births  ?   ?   ?  ?  ? ? ?Past Medical History:  ?Diagnosis Date  ? Asthma   ? Atrial fibrillation (HCC) 2021  ? Endometriosis, uterus   ? Fractured pelvis (HCC)   ? Headache   ? Hyperemesis affecting pregnancy, antepartum   ? Medical history non-contributory   ? Urticaria   ? ? ?Past Surgical History:  ?Procedure Laterality Date  ? ABLATION ON ENDOMETRIOSIS  11/08/2014  ? Procedure: ABLATION ON ENDOMETRIOSIS;  Surgeon: Geryl RankinsEvelyn Varnado, MD;  Location: [facility];  Service: Gynecology;;  ? CARDIOVERSION N/A 10/13/2019  ? Procedure: CARDIOVERSION;  Surgeon: Chrystie NoseHilty, Kenneth C, MD;  [facility];   Service: Cardiovascular;  Laterality: N/A;  ? CHROMOPERTUBATION  11/08/2014  ? Procedure: CHROMOPERTUBATION;  Surgeon: Geryl RankinsEvelyn Varnado, MD;  Location: [facility];  Service: Gynecology;;  ? FOOT SURGERY    ? LAPAROSCOPY N/A 11/08/2014  ? Procedure: LAPAROSCOPY DIAGNOSTIC with peritoneal  biopsy;  Surgeon: Geryl RankinsEvelyn Varnado, MD;  Location: [facility];  Service: Gynecology;  Laterality: N/A;  ? TEE WITHOUT CARDIOVERSION N/A 10/13/2019  ? Procedure: TRANSESOPHAGEAL ECHOCARDIOGRAM (TEE);  Surgeon: Chrystie NoseHilty, Kenneth C, MD;  [facility];  Service: Cardiovascular;  Laterality: N/A;  ? ? ?Family History  ?Problem Relation Age of Onset  ? Healthy Mother   ? Healthy Father   ? ? ?Social History  ? ?Tobacco Use  ? Smoking status: Every Day  ?  Types: Cigars  ?  Passive exposure: Yes  ? Smokeless tobacco: Never  ? Tobacco comments:  ?  boyfriend smokes inside his home  ?Vaping Use  ? Vaping Use: Former  ?Substance Use Topics  ? Alcohol use: Not Currently  ?  Comment: occ  ? Drug use: No  ? ? ?Allergies:  ?Allergies  ?Allergen Reactions  ? Contrast Media [Iodinated Contrast Media] Hives and Other (See Comments)  ?  Warm feeling in back of throat 10 minutes after contrast  ? Ibuprofen Nausea And Vomiting  ? ? ?Medications Prior to Admission  ?Medication Sig Dispense Refill Last Dose  ? doxylamine, Sleep, (UNISOM) 25 MG tablet Take 25 mg by mouth at bedtime as needed.   Past Week  ? famotidine (PEPCID) 20 MG tablet Take 1 tablet (20 mg total) by mouth daily. 90 tablet 2 09/13/2021 at 1230  ? metoCLOPramide (REGLAN) 10 MG tablet Take 1 tablet (10 mg total) by mouth every 6 (six) hours as needed for nausea. 60 tablet 3 09/13/2021 at 1230  ? nitrofurantoin, macrocrystal-monohydrate, (MACROBID) 100 MG capsule Take 1 capsule (100 mg total) by mouth daily. 90 capsule 2 09/13/2021 at 0700  ? ondansetron (ZOFRAN-ODT) 8 MG disintegrating tablet Take 1 tablet (8 mg total) by mouth every 8 (eight) hours as needed for nausea, vomiting or refractory nausea  / vomiting. 90 tablet 3 09/13/2021 at 1200  ? Prenatal Vit-Fe Fumarate-FA (PRENATAL VITAMINS) 28-0.8 MG TABS Take 1 tablet by mouth daily. 30 tablet 0 09/13/2021 at 0700  ? promethazine (PHENERGAN) 25 MG tablet Take 1 tablet (25 mg total) by mouth every 6 (six) hours as needed for nausea, vomiting or refractory nausea / vomiting. 60 tablet 3 09/13/2021 at 1200  ? scopolamine (TRANSDERM-SCOP) 1 MG/3DAYS Place 1 patch onto the skin every 3 (three) days.     ? scopolamine (TRANSDERM-SCOP) 1 MG/3DAYS Place 1 patch (1.5 mg total) onto the skin every 3 (three) days. 10 patch 12   ? ? ?Review of Systems  ?Constitutional: Negative.  Negative for chills and fever.  ?HENT: Negative.    ?Respiratory: Negative.  Negative for cough.   ?Cardiovascular: Negative.  Negative for chest pain.  ?Gastrointestinal:  Positive for vomiting. Negative for diarrhea.  ?Genitourinary: Negative.  Negative for decreased urine volume, vaginal bleeding and vaginal discharge.  ?Musculoskeletal: Negative.   ?Skin: Negative.   ?Neurological: Negative.   ?Hematological: Negative.   ?Psychiatric/Behavioral: Negative.    ?Physical Exam  ? ?Blood pressure 139/84, pulse (!) 102, temperature 98.9 ?F (37.2 ?C), temperature source Oral, resp. rate 18, height 5\' 4"  (1.626 m), weight 80.8 kg, last menstrual period 03/18/2021, SpO2 100 %. ? ?Physical Exam ?Constitutional:   ?   Appearance: Normal appearance.  ?HENT:  ?   Head: Normocephalic.  ?Cardiovascular:  ?   Rate and Rhythm: Normal rate.  ?Pulmonary:  ?   Effort: Pulmonary effort is normal.  ?Abdominal:  ?   General: Abdomen is flat.  ?Genitourinary: ?   General: Normal vulva.  ?Musculoskeletal:     ?   General: Normal range of motion.  ?Skin: ?   General: Skin is warm.  ?Neurological:  ?   General: No focal deficit present.  ?   Mental Status: She is alert.  ?Psychiatric:     ?   Mood and Affect: Mood normal.  ? ? ?MAU Course  ?Procedures ? ?MDM ?-UA is negative for ketones; will start with oral  zofran ?-patient is well-appearing; laughing and talking in bed ?-signs of dehydration.  ?-patient had zofran and tolerated ice chips; patient requesting IV but I explained with no ketones in urine there is no indication for IV fluids. To reassure her, we will check CMP. Patient does not want to wait for results  ? -long discussion with patient about why IV is not necessary; patient expressed frustration that when she called her practice she was routed to an MD that was not part of her  practice. I explained that practices cover each other for call.  ? ?-NST: 150 bpm, mod var, present acel, no decels, no contractions  ?Assessment and Plan  ? ?1. Morning sickness   ?2. [redacted] weeks gestation of pregnancy   ?-patient will continue her medication regimen at home, explained that keeping on top of her medications is the key to managing her HG ? ?-patient feeling strong fetal movements upon discharge ?-patient will call Dr. Connye Burkitt to discuss her concerns as she states she does not feel right and that she needs IV fluids.  ?-return precautions reviewed; encouraged patient to return if anything changes ?-CMP is pending and I will call her if concern for potassium imbalance ?Charlesetta Garibaldi Niko Jakel ?09/13/2021, 8:28 PM  ?

## 2021-09-13 NOTE — MAU Note (Signed)
Pt has not had any vomiting since arrival. Zofran and ice chips provided. Pt states she had to stop with the ice chips because she was begin to have nausea return. Most of ice chips eaten from cup. ?

## 2021-09-14 DIAGNOSIS — O210 Mild hyperemesis gravidarum: Secondary | ICD-10-CM

## 2021-09-14 DIAGNOSIS — Z3A26 26 weeks gestation of pregnancy: Secondary | ICD-10-CM | POA: Diagnosis not present

## 2021-09-23 LAB — OB RESULTS CONSOLE RPR: RPR: NONREACTIVE

## 2021-10-10 ENCOUNTER — Encounter (HOSPITAL_COMMUNITY): Payer: Self-pay | Admitting: Obstetrics and Gynecology

## 2021-10-10 ENCOUNTER — Inpatient Hospital Stay (HOSPITAL_COMMUNITY)
Admission: AD | Admit: 2021-10-10 | Discharge: 2021-10-10 | Disposition: A | Discharge status: Home / Self Care | Payer: Medicaid Other | Attending: Obstetrics and Gynecology | Admitting: Obstetrics and Gynecology

## 2021-10-10 ENCOUNTER — Other Ambulatory Visit: Payer: Self-pay

## 2021-10-10 DIAGNOSIS — G43009 Migraine without aura, not intractable, without status migrainosus: Secondary | ICD-10-CM

## 2021-10-10 DIAGNOSIS — R079 Chest pain, unspecified: Secondary | ICD-10-CM | POA: Diagnosis not present

## 2021-10-10 DIAGNOSIS — O99353 Diseases of the nervous system complicating pregnancy, third trimester: Secondary | ICD-10-CM | POA: Insufficient documentation

## 2021-10-10 DIAGNOSIS — Z3A30 30 weeks gestation of pregnancy: Secondary | ICD-10-CM | POA: Insufficient documentation

## 2021-10-10 DIAGNOSIS — O99891 Other specified diseases and conditions complicating pregnancy: Secondary | ICD-10-CM | POA: Insufficient documentation

## 2021-10-10 LAB — COMPREHENSIVE METABOLIC PANEL
ALT: 14 U/L (ref 0–44)
AST: 16 U/L (ref 15–41)
Albumin: 2.6 g/dL — ABNORMAL LOW (ref 3.5–5.0)
Alkaline Phosphatase: 127 U/L — ABNORMAL HIGH (ref 38–126)
Anion gap: 8 (ref 5–15)
BUN: 7 mg/dL (ref 6–20)
CO2: 23 mmol/L (ref 22–32)
Calcium: 8.2 mg/dL — ABNORMAL LOW (ref 8.9–10.3)
Chloride: 107 mmol/L (ref 98–111)
Creatinine, Ser: 0.56 mg/dL (ref 0.44–1.00)
GFR, Estimated: >60 mL/min (ref >60)
Glucose, Bld: 75 mg/dL (ref 70–99)
Potassium: 3.9 mmol/L (ref 3.5–5.1)
Sodium: 138 mmol/L (ref 135–145)
Total Bilirubin: 0.3 mg/dL (ref 0.3–1.2)
Total Protein: 5.6 g/dL — ABNORMAL LOW (ref 6.5–8.1)

## 2021-10-10 LAB — URINALYSIS, ROUTINE W REFLEX MICROSCOPIC
Bilirubin Urine: NEGATIVE
Glucose, UA: NEGATIVE mg/dL
Hgb urine dipstick: NEGATIVE
Ketones, ur: NEGATIVE mg/dL
Leukocytes,Ua: NEGATIVE
Nitrite: NEGATIVE
Protein, ur: NEGATIVE mg/dL
Specific Gravity, Urine: 1.023 (ref 1.005–1.030)
pH: 5 (ref 5.0–8.0)

## 2021-10-10 LAB — CBC
HCT: 33.3 % — ABNORMAL LOW (ref 36.0–46.0)
Hemoglobin: 11.5 g/dL — ABNORMAL LOW (ref 12.0–15.0)
MCH: 29 pg (ref 26.0–34.0)
MCHC: 34.5 g/dL (ref 30.0–36.0)
MCV: 84.1 fL (ref 80.0–100.0)
Platelets: 212 10*3/uL (ref 150–400)
RBC: 3.96 MIL/uL (ref 3.87–5.11)
RDW: 13.2 % (ref 11.5–15.5)
WBC: 10.5 10*3/uL (ref 4.0–10.5)
nRBC: 0.2 % (ref 0.0–0.2)

## 2021-10-10 LAB — PROTEIN / CREATININE RATIO, URINE
Creatinine, Urine: 321.25 mg/dL
Protein Creatinine Ratio: 0.07 mg/mg{Cre} (ref 0.00–0.15)
Total Protein, Urine: 22 mg/dL

## 2021-10-10 MED ORDER — ACETAMINOPHEN 325 MG PO TABS: 650.0000 mg | ORAL_TABLET | Freq: Once | ORAL | Status: DC

## 2021-10-10 MED ORDER — PROCHLORPERAZINE EDISYLATE 10 MG/2ML IJ SOLN
10.0000 mg | INTRAMUSCULAR | Status: DC | PRN
  Administered 2021-10-10: 10 mg via INTRAVENOUS
  Filled 2021-10-10: qty 2

## 2021-10-10 MED ORDER — LACTATED RINGERS IV SOLN: INTRAVENOUS | Status: DC

## 2021-10-10 MED ORDER — ACETAMINOPHEN-CAFFEINE 500-65 MG PO TABS
2.0000 | ORAL_TABLET | Freq: Once | ORAL | Status: AC
  Administered 2021-10-10: 2 via ORAL
  Filled 2021-10-10: qty 2

## 2021-10-10 MED ORDER — DIPHENHYDRAMINE HCL 50 MG/ML IJ SOLN
25.0000 mg | Freq: Once | INTRAMUSCULAR | Status: AC
  Administered 2021-10-10: 25 mg via INTRAVENOUS
  Filled 2021-10-10: qty 1

## 2021-10-10 NOTE — MAU Note (Signed)
Joyce Franco is a 26 y.o. at [redacted]w[redacted]d here in MAU reporting: since 0400 this morning has noticed increased swelling in hands, feet, and face. Also noted chest pain and headache. Is feeling normal amount of FM but reports movements feel lighter.   Onset of complaint: today  Pain score: headache 7/10, chest pain 6/10  Vitals:   10/10/21 1137  BP: 127/90  Pulse: (!) 109  Resp: 16  Temp: 99 F (37.2 C)  SpO2: 97%     FHT:EFM applied in room  Lab orders placed from triage: UA

## 2021-10-10 NOTE — MAU Provider Note (Signed)
Patient Joyce Franco is a 26 y.o. G1P0000  At 471w5d here with complaints of overall swelling, chest pain in her sternum, HA; she reports floating spots in her vision but none now. She is currently in bed with her hand over her eyes.   She reports that she has new medicine for GERD, and does not think her chest pain is GERD. It feels like a chest tightness.  She also had a TDAP vaccine recently.   She denies LOF, decrease fetal movements, VB, contractions, other concerns for baby.    History     CSN: [id number]  Arrival date and time: 10/10/21 1102   Event Date/Time   First Provider Initiated Contact with Patient 10/10/21 1216      Chief Complaint  Patient presents with   Chest Pain   Headache   Facial Swelling   Chest Pain  This is a new problem. The current episode started today. The onset quality is sudden. The problem occurs constantly. The pain is at a severity of 4/10. The quality of the pain is described as sharp. The pain does not radiate. Associated symptoms include headaches. Pertinent negatives include no back pain or nausea.  Headache  This is a new problem. The current episode started today. The problem occurs constantly. The pain is located in the Right unilateral and occipital region. The pain is at a severity of 7/10. Associated symptoms include photophobia. Pertinent negatives include no abnormal behavior, back pain, blurred vision or nausea. The symptoms are aggravated by bright light.    OB History     Gravida  1   Para  0   Term  0   Preterm  0   AB  0   Living  0      SAB  0   IAB  0   Ectopic  0   Multiple  0   Live Births              Past Medical History:  Diagnosis Date   Asthma    Atrial fibrillation (HCC) 2021   Endometriosis, uterus    Fractured pelvis (HCC)    Headache    Hyperemesis affecting pregnancy, antepartum    Medical history non-contributory    Urticaria     Past Surgical History:  Procedure Laterality Date    ABLATION ON ENDOMETRIOSIS  11/08/2014   Procedure: ABLATION ON ENDOMETRIOSIS;  Surgeon: Geryl RankinsEvelyn Varnado, MD;  Location: [facility];  Service: Gynecology;;   CARDIOVERSION N/A 10/13/2019   Procedure: CARDIOVERSION;  Surgeon: Chrystie NoseHilty, Kenneth C, MD;  [facility];  Service: Cardiovascular;  Laterality: N/A;   CHROMOPERTUBATION  11/08/2014   Procedure: CHROMOPERTUBATION;  Surgeon: Geryl RankinsEvelyn Varnado, MD;  Location: [facility];  Service: Gynecology;;   FOOT SURGERY     LAPAROSCOPY N/A 11/08/2014   Procedure: LAPAROSCOPY DIAGNOSTIC with peritoneal  biopsy;  Surgeon: Geryl RankinsEvelyn Varnado, MD;  Location: [facility];  Service: Gynecology;  Laterality: N/A;   TEE WITHOUT CARDIOVERSION N/A 10/13/2019   Procedure: TRANSESOPHAGEAL ECHOCARDIOGRAM (TEE);  Surgeon: Chrystie NoseHilty, Kenneth C, MD;  [facility];  Service: Cardiovascular;  Laterality: N/A;    Family History  Problem Relation Age of Onset   Healthy Mother    Healthy Father     Social History   Tobacco Use   Smoking status: Every Day    Types: Cigars    Passive exposure: Yes   Smokeless tobacco: Never   Tobacco comments:    boyfriend smokes inside his home  Vaping  Use   Vaping Use: Former  Substance Use Topics   Alcohol use: Not Currently    Comment: occ   Drug use: No    Allergies:  Allergies  Allergen Reactions   Contrast Media [Iodinated Contrast Media] Hives and Other (See Comments)    Warm feeling in back of throat 10 minutes after contrast   Ibuprofen Nausea And Vomiting    Medications Prior to Admission  Medication Sig Dispense Refill Last Dose   doxylamine, Sleep, (UNISOM) 25 MG tablet Take 25 mg by mouth at bedtime as needed.      famotidine (PEPCID) 20 MG tablet Take 1 tablet (20 mg total) by mouth daily. 90 tablet 2    metoCLOPramide (REGLAN) 10 MG tablet Take 1 tablet (10 mg total) by mouth every 6 (six) hours as needed for nausea. 60 tablet 3    nitrofurantoin, macrocrystal-monohydrate, (MACROBID) 100 MG capsule Take 1 capsule  (100 mg total) by mouth daily. 90 capsule 2    ondansetron (ZOFRAN-ODT) 8 MG disintegrating tablet Take 1 tablet (8 mg total) by mouth every 8 (eight) hours as needed for nausea, vomiting or refractory nausea / vomiting. 90 tablet 3    Prenatal Vit-Fe Fumarate-FA (PRENATAL VITAMINS) 28-0.8 MG TABS Take 1 tablet by mouth daily. 30 tablet 0    promethazine (PHENERGAN) 25 MG tablet Take 1 tablet (25 mg total) by mouth every 6 (six) hours as needed for nausea, vomiting or refractory nausea / vomiting. 60 tablet 3    scopolamine (TRANSDERM-SCOP) 1 MG/3DAYS Place 1 patch onto the skin every 3 (three) days.      scopolamine (TRANSDERM-SCOP) 1 MG/3DAYS Place 1 patch (1.5 mg total) onto the skin every 3 (three) days. 10 patch 12     Review of Systems  Constitutional: Negative.   HENT: Negative.    Eyes:  Positive for photophobia. Negative for blurred vision.  Cardiovascular:  Positive for chest pain.  Gastrointestinal:  Negative for nausea.  Genitourinary: Negative.   Musculoskeletal: Negative.  Negative for back pain.  Neurological:  Positive for headaches.   Physical Exam   Blood pressure 132/84, pulse 91, temperature 99 F (37.2 C), temperature source Oral, resp. rate 16, height 5\' 4"  (1.626 m), weight 85.7 kg, last menstrual period 03/18/2021, SpO2 96 %.  Physical Exam Constitutional:      Appearance: She is well-developed.  Cardiovascular:     Rate and Rhythm: Normal rate.  Pulmonary:     Effort: Pulmonary effort is normal.     Breath sounds: Normal breath sounds.  Abdominal:     Palpations: Abdomen is soft.  Skin:    General: Skin is warm.  Neurological:     General: No focal deficit present.     Mental Status: She is alert.  Psychiatric:        Mood and Affect: Mood normal.  Slight periorbital edema bilaterally  MAU Course  Procedures  MDM NST: 135 bpm, mod var, present acel, no decels, no ctx PCR is normal CBC is normal, CMP is normal  EKG is normal  Patient had  migraine cocktail, snack and slept in MAU and reports her pain is now a 0/10. Her blood pressure is no longer elevated, suspect that elevation was due to pain. She continues to have periorbital edema , which warrants close follow-up.  Patient Vitals for the past 24 hrs:  BP Temp Temp src Pulse Resp SpO2 Height Weight  10/10/21 1615 119/60 -- -- 75 -- -- -- --  10/10/21 1600 12/10/21)  114/58 -- -- 99 -- -- -- --  10/10/21 1545 (!) 121/57 -- -- 76 -- -- -- --  10/10/21 1530 (!) 114/56 -- -- 79 -- -- -- --  10/10/21 1515 117/63 -- -- 79 -- -- -- --  10/10/21 1500 117/64 -- -- 82 -- -- -- --  10/10/21 1434 129/81 -- -- 86 -- -- -- --  10/10/21 1400 135/83 -- -- 72 -- 100 % -- --  10/10/21 1345 132/85 -- -- (!) 102 -- 99 % -- --  10/10/21 1330 130/87 -- -- 82 -- 98 % -- --  10/10/21 1312 129/85 -- -- 84 -- -- -- --  10/10/21 1230 132/88 -- -- 89 -- 97 % -- --  10/10/21 1215 (!) 144/93 -- -- 89 -- 98 % -- --  10/10/21 1201 132/84 -- -- 91 -- 96 % -- --  10/10/21 1137 127/90 99 F (37.2 C) Oral (!) 109 16 97 % 5\' 4"  (1.626 m) 85.7 kg  '  Assessment and Plan   1. Migraine without aura and without status migrainosus, not intractable    -patient amenable to discharge; reviewed warning signs of pre-e and when to come back; she and her mom verbalized understanding -reviewed warning signs of labor and decreased fetal movements -TC to Dr. to schedule patient for follow-up Bp check this week in office Connye Burkitt Keilah Lemire 10/10/2021, 12:17 PM

## 2021-10-11 ENCOUNTER — Inpatient Hospital Stay (HOSPITAL_COMMUNITY)
Admission: AD | Admit: 2021-10-11 | Discharge: 2021-10-12 | Disposition: A | Discharge status: Home / Self Care | Payer: Medicaid Other | Attending: Obstetrics & Gynecology | Admitting: Obstetrics & Gynecology

## 2021-10-11 ENCOUNTER — Encounter (HOSPITAL_COMMUNITY): Payer: Self-pay

## 2021-10-11 DIAGNOSIS — O1203 Gestational edema, third trimester: Secondary | ICD-10-CM | POA: Diagnosis not present

## 2021-10-11 DIAGNOSIS — R519 Headache, unspecified: Secondary | ICD-10-CM | POA: Insufficient documentation

## 2021-10-11 DIAGNOSIS — O26893 Other specified pregnancy related conditions, third trimester: Secondary | ICD-10-CM | POA: Diagnosis not present

## 2021-10-11 DIAGNOSIS — M7989 Other specified soft tissue disorders: Secondary | ICD-10-CM | POA: Insufficient documentation

## 2021-10-11 DIAGNOSIS — M79606 Pain in leg, unspecified: Secondary | ICD-10-CM | POA: Diagnosis not present

## 2021-10-11 DIAGNOSIS — O10913 Unspecified pre-existing hypertension complicating pregnancy, third trimester: Secondary | ICD-10-CM | POA: Diagnosis not present

## 2021-10-11 DIAGNOSIS — Z3A31 31 weeks gestation of pregnancy: Secondary | ICD-10-CM | POA: Insufficient documentation

## 2021-10-11 DIAGNOSIS — O99891 Other specified diseases and conditions complicating pregnancy: Secondary | ICD-10-CM | POA: Insufficient documentation

## 2021-10-11 LAB — URINALYSIS, ROUTINE W REFLEX MICROSCOPIC
Bilirubin Urine: NEGATIVE
Glucose, UA: NEGATIVE mg/dL
Hgb urine dipstick: NEGATIVE
Ketones, ur: NEGATIVE mg/dL
Leukocytes,Ua: NEGATIVE
Nitrite: NEGATIVE
Protein, ur: NEGATIVE mg/dL
Specific Gravity, Urine: 1.023 (ref 1.005–1.030)
pH: 5 (ref 5.0–8.0)

## 2021-10-11 MED ORDER — FAMOTIDINE IN NACL 20-0.9 MG/50ML-% IV SOLN
20.0000 mg | Freq: Once | INTRAVENOUS | Status: AC
  Administered 2021-10-11: 20 mg via INTRAVENOUS
  Filled 2021-10-11: qty 50

## 2021-10-11 MED ORDER — PROCHLORPERAZINE EDISYLATE 10 MG/2ML IJ SOLN
10.0000 mg | Freq: Once | INTRAMUSCULAR | Status: AC
Start: 2021-10-11 — End: 2021-10-11
  Administered 2021-10-11: 10 mg via INTRAVENOUS
  Filled 2021-10-11: qty 2

## 2021-10-11 MED ORDER — ACETAMINOPHEN-CAFFEINE 500-65 MG PO TABS
2.0000 | ORAL_TABLET | Freq: Once | ORAL | Status: AC
  Administered 2021-10-11: 2 via ORAL
  Filled 2021-10-11: qty 2

## 2021-10-11 MED ORDER — LACTATED RINGERS IV BOLUS
1000.0000 mL | Freq: Once | INTRAVENOUS | Status: AC
Start: 2021-10-12 — End: 2021-10-12
  Administered 2021-10-12: 1000 mL via INTRAVENOUS

## 2021-10-11 MED ORDER — DIPHENHYDRAMINE HCL 50 MG/ML IJ SOLN
25.0000 mg | INTRAMUSCULAR | Status: AC
  Administered 2021-10-11: 25 mg via INTRAVENOUS
  Filled 2021-10-11: qty 1

## 2021-10-11 MED ORDER — LACTATED RINGERS IV BOLUS
1000.0000 mL | Freq: Once | INTRAVENOUS | Status: AC
  Administered 2021-10-11: 1000 mL via INTRAVENOUS

## 2021-10-11 MED ORDER — SODIUM CHLORIDE 0.9 % IV SOLN
8.0000 mg | Freq: Once | INTRAVENOUS | Status: AC
  Administered 2021-10-12: 8 mg via INTRAVENOUS
  Filled 2021-10-11: qty 4

## 2021-10-11 NOTE — MAU Note (Signed)
Pt sleeping with eyes closed-mother at bedside-call bell within reach.

## 2021-10-11 NOTE — MAU Provider Note (Signed)
History     CSN: [id number]  Arrival date and time: 10/11/21 1954   Event Date/Time   First Provider Initiated Contact with Patient 10/11/21 2139      Chief Complaint  Patient presents with   Headache   Hypertension   Shortness of Breath   Leg Swelling   Ms. Joyce Franco is a 26 y.o. year old 671P0000 female at 4332w0d weeks gestation who presents to MAU reporting elevated BP, generalized swelling, pain in upper thighs, RT sided- H/A with eye pressure, vomited twice today without nausea., and she is seeing floaters. She was seen yesterday for the same complaints, was treated with relief and d/c'd home with instructions to f/u in OB office next week for BP recheck. She reports (+) FM. She has a h/o Hyperemesis Gravidarum; where she has been hospitalized . [facility]; next appt is next week. Her mother is present and contributing to the history taking.   OB History     Gravida  1   Para  0   Term  0   Preterm  0   AB  0   Living  0      SAB  0   IAB  0   Ectopic  0   Multiple  0   Live Births              Past Medical History:  Diagnosis Date   Asthma    Atrial fibrillation (HCC) 2021   Endometriosis, uterus    Fractured pelvis (HCC)    Headache    Hyperemesis affecting pregnancy, antepartum    Medical history non-contributory    Urticaria     Past Surgical History:  Procedure Laterality Date   ABLATION ON ENDOMETRIOSIS  11/08/2014   Procedure: ABLATION ON ENDOMETRIOSIS;  Surgeon: Geryl RankinsEvelyn Varnado, MD;  Location: [facility];  Service: Gynecology;;   CARDIOVERSION N/A 10/13/2019   Procedure: CARDIOVERSION;  Surgeon: Chrystie NoseHilty, Kenneth C, MD;  [facility];  Service: Cardiovascular;  Laterality: N/A;   CHROMOPERTUBATION  11/08/2014   Procedure: CHROMOPERTUBATION;  Surgeon: Geryl RankinsEvelyn Varnado, MD;  Location: [facility];  Service: Gynecology;;   FOOT SURGERY     LAPAROSCOPY N/A 11/08/2014   Procedure: LAPAROSCOPY DIAGNOSTIC with peritoneal   biopsy;  Surgeon: Geryl RankinsEvelyn Varnado, MD;  Location: [facility];  Service: Gynecology;  Laterality: N/A;   TEE WITHOUT CARDIOVERSION N/A 10/13/2019   Procedure: TRANSESOPHAGEAL ECHOCARDIOGRAM (TEE);  Surgeon: Chrystie NoseHilty, Kenneth C, MD;  [facility];  Service: Cardiovascular;  Laterality: N/A;    Family History  Problem Relation Age of Onset   Healthy Mother    Healthy Father     Social History   Tobacco Use   Smoking status: Every Day    Types: Cigars    Passive exposure: Yes   Smokeless tobacco: Never   Tobacco comments:    boyfriend smokes inside his home  Vaping Use   Vaping Use: Former  Substance Use Topics   Alcohol use: Not Currently    Comment: occ   Drug use: No    Allergies:  Allergies  Allergen Reactions   Contrast Media [Iodinated Contrast Media] Hives and Other (See Comments)    Warm feeling in back of throat 10 minutes after contrast   Ibuprofen Nausea And Vomiting    Medications Prior to Admission  Medication Sig Dispense Refill Last Dose   acetaminophen (TYLENOL) 325 MG tablet Take 650 mg by mouth every 6 (six) hours as needed for  mild pain or moderate pain.   10/11/2021 at 1600   diphenhydrAMINE (BENADRYL) 25 MG tablet Take 25 mg by mouth every 6 (six) hours as needed for sleep.   10/11/2021 at 1600   famotidine (PEPCID) 20 MG tablet Take 1 tablet (20 mg total) by mouth daily. 90 tablet 2 10/11/2021   metoCLOPramide (REGLAN) 10 MG tablet Take 1 tablet (10 mg total) by mouth every 6 (six) hours as needed for nausea. 60 tablet 3 10/11/2021   Prenatal Vit-Fe Fumarate-FA (PRENATAL VITAMINS) 28-0.8 MG TABS Take 1 tablet by mouth daily. 30 tablet 0 10/11/2021   promethazine (PHENERGAN) 25 MG tablet Take 1 tablet (25 mg total) by mouth every 6 (six) hours as needed for nausea, vomiting or refractory nausea / vomiting. 60 tablet 3 10/11/2021   doxylamine, Sleep, (UNISOM) 25 MG tablet Take 25 mg by mouth at bedtime as needed.      nitrofurantoin, macrocrystal-monohydrate,  (MACROBID) 100 MG capsule Take 1 capsule (100 mg total) by mouth daily. 90 capsule 2    ondansetron (ZOFRAN-ODT) 8 MG disintegrating tablet Take 1 tablet (8 mg total) by mouth every 8 (eight) hours as needed for nausea, vomiting or refractory nausea / vomiting. 90 tablet 3    scopolamine (TRANSDERM-SCOP) 1 MG/3DAYS Place 1 patch onto the skin every 3 (three) days.      scopolamine (TRANSDERM-SCOP) 1 MG/3DAYS Place 1 patch (1.5 mg total) onto the skin every 3 (three) days. 10 patch 12     Review of Systems  Constitutional: Negative.   HENT: Negative.    Eyes:  Positive for photophobia and visual disturbance (floaters in vision).  Cardiovascular:  Positive for leg swelling.       Elevated BPs at home: 128/86, 129/91 @ 1500; 135/96 @ 1930  Gastrointestinal:  Positive for vomiting.  Endocrine: Negative.   Musculoskeletal:  Positive for arthralgias and myalgias.  Skin: Negative.   Allergic/Immunologic: Negative.   Neurological:  Positive for headaches.  Hematological: Negative.   Psychiatric/Behavioral: Negative.     Physical Exam   Patient Vitals for the past 24 hrs:  BP Temp Temp src Pulse Resp SpO2 Height Weight  10/12/21 0245 121/68 -- -- -- -- 97 % -- --  10/12/21 0244 -- 98.1 F (36.7 C) Oral 85 17 -- -- --  10/12/21 0101 116/63 -- -- 81 19 -- -- --  10/12/21 0030 121/68 -- -- 87 20 -- -- --  10/12/21 0015 123/64 -- -- 85 -- -- -- --  10/12/21 0000 122/74 -- -- 95 -- -- -- --  10/11/21 2345 113/62 -- -- 86 -- -- -- --  10/11/21 2330 117/67 -- -- 80 -- -- -- --  10/11/21 2315 112/64 -- -- 86 -- -- -- --  10/11/21 2300 111/68 -- -- 85 -- -- -- --  10/11/21 2251 114/65 -- -- 84 17 -- -- --  10/11/21 2210 -- -- -- -- -- 98 % -- --  10/11/21 2205 -- -- -- -- -- 98 % -- --  10/11/21 2201 (!) 111/49 -- -- 86 18 -- -- --  10/11/21 2200 -- -- -- -- -- 99 % -- --  10/11/21 2155 -- -- -- -- -- 98 % -- --  10/11/21 2145 134/71 -- -- 95 -- 98 % -- --  10/11/21 2128 123/89 -- -- 97 --  -- -- --  10/11/21 2014 132/78 99 F (37.2 C) -- (!) 111 20 -- 5\' 4"  (1.626 m) 90.2 kg  Physical Exam Vitals and nursing note reviewed.  Constitutional:      Appearance: Normal appearance. She is obese.  HENT:     Head: Normocephalic and atraumatic.  Cardiovascular:     Rate and Rhythm: Tachycardia present.  Pulmonary:     Effort: Pulmonary effort is normal.  Abdominal:     Palpations: Abdomen is soft.     Tenderness: There is abdominal tenderness (RLQ).  Genitourinary:    Comments: Not indicated Musculoskeletal:        General: Swelling and tenderness present.     Cervical back: Normal range of motion.     Right lower leg: Edema (1+pitting) present.     Left lower leg: Edema (L>R, 1+pitting) present.  Skin:    General: Skin is warm and dry.  Neurological:     Mental Status: She is alert and oriented to person, place, and time.  Psychiatric:        Mood and Affect: Mood normal.        Behavior: Behavior normal.        Thought Content: Thought content normal.        Judgment: Judgment normal.    REACTIVE NST - FHR: 135 bpm / moderate variability / accels present / decels absent / TOCO: none  MAU Course  Procedures  MDM CCUA LR bolus x 2 liters Benadryl 25 mg IVP Compazine 10 mg IVP Pepcid 20 mg IVPB Excedrin Tension Headache 2 tabs Zofran 8 mg IVPB Flexeril 10 mg po -- reduced H/A to 5/10, "feels like pressure behind the eyes"  Results for orders placed or performed during the hospital encounter of 10/11/21 (from the past 24 hour(s))  Urinalysis, Routine w reflex microscopic     Status: Abnormal   Collection Time: 10/11/21  8:21 PM  Result Value Ref Range   Color, Urine YELLOW YELLOW   APPearance HAZY (A) CLEAR   Specific Gravity, Urine 1.023 1.005 - 1.030   pH 5.0 5.0 - 8.0   Glucose, UA NEGATIVE NEGATIVE mg/dL   Hgb urine dipstick NEGATIVE NEGATIVE   Bilirubin Urine NEGATIVE NEGATIVE   Ketones, ur NEGATIVE NEGATIVE mg/dL   Protein, ur NEGATIVE  NEGATIVE mg/dL   Nitrite NEGATIVE NEGATIVE   Leukocytes,Ua NEGATIVE NEGATIVE     Assessment and Plan  Headache in pregnancy, antepartum, third trimester - Information provided on form for tracking headache - Rx for Flexeril 10 mg BID prn pain   Swelling of lower extremity during pregnancy in third trimester  - Advised on increasing daily water intake to help improve edema in BLE  Pregnancy related leg pain in third trimester, antepartum - Flexeril or Tylenol for pain prn  [redacted] weeks gestation of pregnancy    - Discharge patient - Keep scheduled appt with [facility] next week for f/u BP check - Reassurance given that BPs here have been WNL - Patient verbalized an understanding of the plan of care and agrees.    Raelyn Mora, CNM 10/11/2021, 9:40 PM

## 2021-10-11 NOTE — MAU Note (Signed)
.  KEZIYAH KNEALE is a 26 y.o. at [redacted]w[redacted]d here in MAU reporting: elevated BP, generalized swelling, right sided HA with eye pressure, Sensitivity to light, and SOB. Pt was seen yesterday in MAU for same s/s and they continued and the edema is worse causing pain in upper thigh. Pt took her BP at 1500 128/86, 129/91, and at 1930 it was 135/96. Pt reports vomiting without nausea twice today as well. Pt reports floaters as well. Pt denies DFM, VB, LOF, abnormal discharge, right sided epigastric pain, recent intercourse, and complications in the pregnancy.   Onset of complaint: 2 days ago Pain score: 10/10 legs, 7/10 HA Vitals:   10/11/21 2014  BP: 132/78  Pulse: (!) 111  Resp: 20  Temp: 99 F (37.2 C)     FHT:145 Lab orders placed from triage:  UA

## 2021-10-12 DIAGNOSIS — R519 Headache, unspecified: Secondary | ICD-10-CM | POA: Diagnosis not present

## 2021-10-12 DIAGNOSIS — O26893 Other specified pregnancy related conditions, third trimester: Secondary | ICD-10-CM | POA: Diagnosis not present

## 2021-10-12 DIAGNOSIS — Z3A31 31 weeks gestation of pregnancy: Secondary | ICD-10-CM | POA: Diagnosis not present

## 2021-10-12 MED ORDER — CYCLOBENZAPRINE HCL 5 MG PO TABS
10.0000 mg | ORAL_TABLET | Freq: Once | ORAL | Status: AC
Start: 2021-10-12 — End: 2021-10-12
  Administered 2021-10-12: 10 mg via ORAL
  Filled 2021-10-12: qty 2

## 2021-10-12 MED ORDER — CYCLOBENZAPRINE HCL 10 MG PO TABS: 10.0000 mg | ORAL_TABLET | Freq: Two times a day (BID) | ORAL | 0 refills | Status: DC | PRN

## 2021-10-12 NOTE — MAU Note (Addendum)
Pt vomited small amount of red emesis. Pt states nausea has not resolved and she would like something for nausea. Order received from provider. Explained to pt med comes from pharmacy and as soon as it arrives I will bring it in. Voiced understanding

## 2021-10-12 NOTE — MAU Note (Signed)
Pt sleeping-no additional vomiting

## 2021-10-12 NOTE — MAU Note (Signed)
Pt states nausea resolved-headache remains at 7 on 0-10 scale but she states it is not as intense behind her eyes. Legs continue to be restless

## 2021-10-15 ENCOUNTER — Other Ambulatory Visit: Payer: Self-pay | Admitting: Obstetrics and Gynecology

## 2021-10-15 DIAGNOSIS — M7989 Other specified soft tissue disorders: Secondary | ICD-10-CM

## 2021-10-16 ENCOUNTER — Other Ambulatory Visit: Payer: Medicaid Other

## 2021-10-16 ENCOUNTER — Ambulatory Visit
Admission: RE | Admit: 2021-10-16 | Discharge: 2021-10-16 | Disposition: A | Discharge status: Home / Self Care | Payer: Medicaid Other | Source: Ambulatory Visit | Attending: Obstetrics and Gynecology | Admitting: Obstetrics and Gynecology

## 2021-10-16 DIAGNOSIS — M7989 Other specified soft tissue disorders: Secondary | ICD-10-CM

## 2021-10-24 ENCOUNTER — Other Ambulatory Visit: Payer: Self-pay | Admitting: Obstetrics and Gynecology

## 2021-10-24 DIAGNOSIS — Z3689 Encounter for other specified antenatal screening: Secondary | ICD-10-CM

## 2021-10-27 ENCOUNTER — Other Ambulatory Visit: Payer: Self-pay | Admitting: Obstetrics and Gynecology

## 2021-10-27 ENCOUNTER — Ambulatory Visit: Payer: Medicaid Other | Attending: Obstetrics and Gynecology

## 2021-10-27 ENCOUNTER — Ambulatory Visit: Payer: Medicaid Other | Admitting: *Deleted

## 2021-10-27 ENCOUNTER — Encounter: Payer: Self-pay | Admitting: *Deleted

## 2021-10-27 ENCOUNTER — Ambulatory Visit (HOSPITAL_BASED_OUTPATIENT_CLINIC_OR_DEPARTMENT_OTHER): Payer: Medicaid Other | Admitting: Obstetrics and Gynecology

## 2021-10-27 VITALS — BP 138/93 | HR 99

## 2021-10-27 DIAGNOSIS — O365930 Maternal care for other known or suspected poor fetal growth, third trimester, not applicable or unspecified: Secondary | ICD-10-CM

## 2021-10-27 DIAGNOSIS — Z3689 Encounter for other specified antenatal screening: Secondary | ICD-10-CM

## 2021-10-27 DIAGNOSIS — D573 Sickle-cell trait: Secondary | ICD-10-CM | POA: Diagnosis present

## 2021-10-27 DIAGNOSIS — O133 Gestational [pregnancy-induced] hypertension without significant proteinuria, third trimester: Secondary | ICD-10-CM

## 2021-10-27 DIAGNOSIS — Z3A33 33 weeks gestation of pregnancy: Secondary | ICD-10-CM | POA: Diagnosis present

## 2021-10-27 DIAGNOSIS — O99019 Anemia complicating pregnancy, unspecified trimester: Secondary | ICD-10-CM

## 2021-10-28 ENCOUNTER — Other Ambulatory Visit: Payer: Self-pay | Admitting: *Deleted

## 2021-10-28 ENCOUNTER — Telehealth: Payer: Self-pay

## 2021-10-28 DIAGNOSIS — O365930 Maternal care for other known or suspected poor fetal growth, third trimester, not applicable or unspecified: Secondary | ICD-10-CM

## 2021-10-30 ENCOUNTER — Ambulatory Visit: Payer: Medicaid Other | Admitting: *Deleted

## 2021-10-30 ENCOUNTER — Ambulatory Visit (HOSPITAL_BASED_OUTPATIENT_CLINIC_OR_DEPARTMENT_OTHER): Payer: Medicaid Other | Admitting: *Deleted

## 2021-10-30 ENCOUNTER — Other Ambulatory Visit: Payer: Self-pay

## 2021-10-30 ENCOUNTER — Encounter (HOSPITAL_COMMUNITY): Payer: Self-pay | Admitting: Obstetrics and Gynecology

## 2021-10-30 ENCOUNTER — Ambulatory Visit (HOSPITAL_BASED_OUTPATIENT_CLINIC_OR_DEPARTMENT_OTHER): Payer: Medicaid Other | Admitting: Maternal & Fetal Medicine

## 2021-10-30 ENCOUNTER — Inpatient Hospital Stay (HOSPITAL_COMMUNITY)
Admission: AD | Admit: 2021-10-30 | Discharge: 2021-11-06 | DRG: 807 | Disposition: A | Discharge status: Home / Self Care | Payer: Medicaid Other | Attending: Obstetrics and Gynecology | Admitting: Obstetrics and Gynecology

## 2021-10-30 ENCOUNTER — Ambulatory Visit (HOSPITAL_BASED_OUTPATIENT_CLINIC_OR_DEPARTMENT_OTHER): Payer: Medicaid Other

## 2021-10-30 VITALS — BP 155/96 | HR 105

## 2021-10-30 DIAGNOSIS — O9902 Anemia complicating childbirth: Secondary | ICD-10-CM | POA: Diagnosis present

## 2021-10-30 DIAGNOSIS — Z3A33 33 weeks gestation of pregnancy: Secondary | ICD-10-CM | POA: Diagnosis not present

## 2021-10-30 DIAGNOSIS — Z886 Allergy status to analgesic agent status: Secondary | ICD-10-CM | POA: Diagnosis not present

## 2021-10-30 DIAGNOSIS — R000 Tachycardia, unspecified: Secondary | ICD-10-CM | POA: Diagnosis not present

## 2021-10-30 DIAGNOSIS — O134 Gestational [pregnancy-induced] hypertension without significant proteinuria, complicating childbirth: Secondary | ICD-10-CM | POA: Diagnosis present

## 2021-10-30 DIAGNOSIS — O368130 Decreased fetal movements, third trimester, not applicable or unspecified: Secondary | ICD-10-CM | POA: Diagnosis present

## 2021-10-30 DIAGNOSIS — O365930 Maternal care for other known or suspected poor fetal growth, third trimester, not applicable or unspecified: Secondary | ICD-10-CM | POA: Diagnosis present

## 2021-10-30 DIAGNOSIS — O8612 Endometritis following delivery: Secondary | ICD-10-CM | POA: Diagnosis not present

## 2021-10-30 DIAGNOSIS — O1493 Unspecified pre-eclampsia, third trimester: Secondary | ICD-10-CM | POA: Diagnosis present

## 2021-10-30 DIAGNOSIS — O1410 Severe pre-eclampsia, unspecified trimester: Principal | ICD-10-CM

## 2021-10-30 DIAGNOSIS — R079 Chest pain, unspecified: Secondary | ICD-10-CM | POA: Diagnosis not present

## 2021-10-30 DIAGNOSIS — O99824 Streptococcus B carrier state complicating childbirth: Secondary | ICD-10-CM | POA: Diagnosis present

## 2021-10-30 DIAGNOSIS — O99893 Other specified diseases and conditions complicating puerperium: Secondary | ICD-10-CM | POA: Diagnosis not present

## 2021-10-30 DIAGNOSIS — I480 Paroxysmal atrial fibrillation: Secondary | ICD-10-CM | POA: Diagnosis not present

## 2021-10-30 DIAGNOSIS — F17210 Nicotine dependence, cigarettes, uncomplicated: Secondary | ICD-10-CM

## 2021-10-30 DIAGNOSIS — O99333 Smoking (tobacco) complicating pregnancy, third trimester: Secondary | ICD-10-CM | POA: Diagnosis not present

## 2021-10-30 DIAGNOSIS — R072 Precordial pain: Secondary | ICD-10-CM | POA: Diagnosis not present

## 2021-10-30 DIAGNOSIS — O99334 Smoking (tobacco) complicating childbirth: Secondary | ICD-10-CM | POA: Diagnosis present

## 2021-10-30 DIAGNOSIS — O133 Gestational [pregnancy-induced] hypertension without significant proteinuria, third trimester: Secondary | ICD-10-CM

## 2021-10-30 DIAGNOSIS — O1414 Severe pre-eclampsia complicating childbirth: Secondary | ICD-10-CM | POA: Diagnosis present

## 2021-10-30 DIAGNOSIS — O1413 Severe pre-eclampsia, third trimester: Secondary | ICD-10-CM | POA: Insufficient documentation

## 2021-10-30 DIAGNOSIS — D573 Sickle-cell trait: Secondary | ICD-10-CM | POA: Diagnosis present

## 2021-10-30 DIAGNOSIS — R071 Chest pain on breathing: Secondary | ICD-10-CM | POA: Diagnosis not present

## 2021-10-30 DIAGNOSIS — O365931 Maternal care for other known or suspected poor fetal growth, third trimester, fetus 1: Secondary | ICD-10-CM | POA: Insufficient documentation

## 2021-10-30 DIAGNOSIS — O285 Abnormal chromosomal and genetic finding on antenatal screening of mother: Secondary | ICD-10-CM

## 2021-10-30 LAB — COMPREHENSIVE METABOLIC PANEL
ALT: 15 U/L (ref 0–44)
AST: 18 U/L (ref 15–41)
Albumin: 2.8 g/dL — ABNORMAL LOW (ref 3.5–5.0)
Alkaline Phosphatase: 178 U/L — ABNORMAL HIGH (ref 38–126)
Anion gap: 12 (ref 5–15)
BUN: 10 mg/dL (ref 6–20)
CO2: 20 mmol/L — ABNORMAL LOW (ref 22–32)
Calcium: 8.8 mg/dL — ABNORMAL LOW (ref 8.9–10.3)
Chloride: 107 mmol/L (ref 98–111)
Creatinine, Ser: 0.64 mg/dL (ref 0.44–1.00)
GFR, Estimated: >60 mL/min (ref >60)
Glucose, Bld: 77 mg/dL (ref 70–99)
Potassium: 3.2 mmol/L — ABNORMAL LOW (ref 3.5–5.1)
Sodium: 139 mmol/L (ref 135–145)
Total Bilirubin: 0.5 mg/dL (ref 0.3–1.2)
Total Protein: 6.1 g/dL — ABNORMAL LOW (ref 6.5–8.1)

## 2021-10-30 LAB — TYPE AND SCREEN
ABO/RH(D): O POS
Antibody Screen: NEGATIVE

## 2021-10-30 LAB — CBC
HCT: 32.3 % — ABNORMAL LOW (ref 36.0–46.0)
Hemoglobin: 11.5 g/dL — ABNORMAL LOW (ref 12.0–15.0)
MCH: 28.5 pg (ref 26.0–34.0)
MCHC: 35.6 g/dL (ref 30.0–36.0)
MCV: 80.1 fL (ref 80.0–100.0)
Platelets: 290 10*3/uL (ref 150–400)
RBC: 4.03 MIL/uL (ref 3.87–5.11)
RDW: 13 % (ref 11.5–15.5)
WBC: 11.7 10*3/uL — ABNORMAL HIGH (ref 4.0–10.5)
nRBC: 0.2 % (ref 0.0–0.2)

## 2021-10-30 LAB — PROTEIN / CREATININE RATIO, URINE
Creatinine, Urine: 154.17 mg/dL
Protein Creatinine Ratio: 0.15 mg/mg{Cre} (ref 0.00–0.15)
Total Protein, Urine: 23 mg/dL

## 2021-10-30 LAB — LACTATE DEHYDROGENASE: LDH: 154 U/L (ref 98–192)

## 2021-10-30 MED ORDER — OXYTOCIN-SODIUM CHLORIDE 30-0.9 UT/500ML-% IV SOLN
2.5000 [IU]/h | INTRAVENOUS | Status: DC
Start: 2021-10-30 — End: 2021-11-06
  Administered 2021-10-31: 2.5 [IU]/h via INTRAVENOUS

## 2021-10-30 MED ORDER — OXYCODONE-ACETAMINOPHEN 5-325 MG PO TABS: 1.0000 | ORAL_TABLET | ORAL | Status: DC | PRN

## 2021-10-30 MED ORDER — CALCIUM CARBONATE ANTACID 500 MG PO CHEW: 2.0000 | CHEWABLE_TABLET | ORAL | Status: DC | PRN

## 2021-10-30 MED ORDER — BUTALBITAL-APAP-CAFFEINE 50-325-40 MG PO TABS
2.0000 | ORAL_TABLET | Freq: Four times a day (QID) | ORAL | Status: DC | PRN
Start: 2021-10-30 — End: 2021-11-06
  Administered 2021-10-30 – 2021-11-04 (×5): 2 via ORAL
  Filled 2021-10-30 (×5): qty 2

## 2021-10-30 MED ORDER — PRENATAL MULTIVITAMIN CH
1.0000 | ORAL_TABLET | Freq: Every day | ORAL | Status: DC
  Administered 2021-10-30: 1 via ORAL
  Filled 2021-10-30: qty 1

## 2021-10-30 MED ORDER — SCOPOLAMINE 1 MG/3DAYS TD PT72: 1.0000 | MEDICATED_PATCH | TRANSDERMAL | Status: DC | PRN

## 2021-10-30 MED ORDER — PENICILLIN G POT IN DEXTROSE 60000 UNIT/ML IV SOLN
3.0000 10*6.[IU] | INTRAVENOUS | Status: DC
  Administered 2021-10-31 (×5): 3 10*6.[IU] via INTRAVENOUS
  Filled 2021-10-30 (×9): qty 50

## 2021-10-30 MED ORDER — OXYTOCIN BOLUS FROM INFUSION
333.0000 mL | Freq: Once | INTRAVENOUS | Status: AC
  Administered 2021-10-31: 333 mL via INTRAVENOUS

## 2021-10-30 MED ORDER — LACTATED RINGERS IV SOLN: INTRAVENOUS | Status: DC

## 2021-10-30 MED ORDER — SODIUM CHLORIDE 0.9% FLUSH
3.0000 mL | Freq: Two times a day (BID) | INTRAVENOUS | Status: DC
  Administered 2021-10-30: 3 mL via INTRAVENOUS

## 2021-10-30 MED ORDER — PROMETHAZINE HCL 25 MG PO TABS
25.0000 mg | ORAL_TABLET | Freq: Four times a day (QID) | ORAL | Status: DC
  Administered 2021-10-31 (×2): 25 mg via ORAL
  Filled 2021-10-30 (×8): qty 1

## 2021-10-30 MED ORDER — OXYTOCIN-SODIUM CHLORIDE 30-0.9 UT/500ML-% IV SOLN
1.0000 m[IU]/min | INTRAVENOUS | Status: DC
  Administered 2021-10-31: 2 m[IU]/min via INTRAVENOUS
  Filled 2021-10-30: qty 500

## 2021-10-30 MED ORDER — OXYCODONE-ACETAMINOPHEN 5-325 MG PO TABS: 2.0000 | ORAL_TABLET | ORAL | Status: DC | PRN

## 2021-10-30 MED ORDER — ACETAMINOPHEN 325 MG PO TABS: 650.0000 mg | ORAL_TABLET | ORAL | Status: DC | PRN

## 2021-10-30 MED ORDER — LIDOCAINE HCL (PF) 1 % IJ SOLN: 30.0000 mL | INTRAMUSCULAR | Status: DC | PRN

## 2021-10-30 MED ORDER — SUCRALFATE 1 GM/10ML PO SUSP
1.0000 g | Freq: Three times a day (TID) | ORAL | Status: DC
  Administered 2021-11-01 – 2021-11-05 (×8): 1 g via ORAL
  Filled 2021-10-30 (×28): qty 10

## 2021-10-30 MED ORDER — SODIUM CHLORIDE 0.9 % IV SOLN
5.0000 10*6.[IU] | Freq: Once | INTRAVENOUS | Status: AC
  Administered 2021-10-30: 5 10*6.[IU] via INTRAVENOUS
  Filled 2021-10-30: qty 5

## 2021-10-30 MED ORDER — PANTOPRAZOLE SODIUM 40 MG PO TBEC
40.0000 mg | DELAYED_RELEASE_TABLET | Freq: Every day | ORAL | Status: DC
  Administered 2021-10-31 – 2021-11-06 (×7): 40 mg via ORAL
  Filled 2021-10-30 (×9): qty 1

## 2021-10-30 MED ORDER — MISOPROSTOL 25 MCG QUARTER TABLET: 25.0000 ug | ORAL_TABLET | ORAL | Status: DC | PRN

## 2021-10-30 MED ORDER — HYDRALAZINE HCL 20 MG/ML IJ SOLN: 10.0000 mg | INTRAMUSCULAR | Status: DC | PRN

## 2021-10-30 MED ORDER — LACTATED RINGERS IV SOLN: 500.0000 mL | INTRAVENOUS | Status: DC | PRN

## 2021-10-30 MED ORDER — CYCLOBENZAPRINE HCL 10 MG PO TABS
10.0000 mg | ORAL_TABLET | Freq: Three times a day (TID) | ORAL | Status: DC | PRN
  Administered 2021-11-04 (×2): 10 mg via ORAL
  Filled 2021-10-30 (×2): qty 1

## 2021-10-30 MED ORDER — ZOLPIDEM TARTRATE 5 MG PO TABS: 5.0000 mg | ORAL_TABLET | Freq: Every evening | ORAL | Status: DC | PRN

## 2021-10-30 MED ORDER — ONDANSETRON 4 MG PO TBDP
8.0000 mg | ORAL_TABLET | Freq: Three times a day (TID) | ORAL | Status: DC
  Filled 2021-10-30: qty 2

## 2021-10-30 MED ORDER — HYDROXYZINE HCL 50 MG PO TABS: 50.0000 mg | ORAL_TABLET | Freq: Four times a day (QID) | ORAL | Status: DC | PRN

## 2021-10-30 MED ORDER — MAGNESIUM SULFATE 40 GM/1000ML IV SOLN
2.0000 g/h | INTRAVENOUS | Status: AC
  Administered 2021-10-31 – 2021-11-01 (×2): 2 g/h via INTRAVENOUS
  Filled 2021-10-30 (×3): qty 1000

## 2021-10-30 MED ORDER — OXYTOCIN-SODIUM CHLORIDE 30-0.9 UT/500ML-% IV SOLN: 1.0000 m[IU]/min | INTRAVENOUS | Status: DC

## 2021-10-30 MED ORDER — FENTANYL CITRATE (PF) 100 MCG/2ML IJ SOLN
50.0000 ug | INTRAMUSCULAR | Status: DC | PRN
  Administered 2021-10-31: 100 ug via INTRAVENOUS
  Filled 2021-10-30: qty 2

## 2021-10-30 MED ORDER — MAGNESIUM SULFATE BOLUS VIA INFUSION
4.0000 g | Freq: Once | INTRAVENOUS | Status: AC
  Administered 2021-10-30: 4 g via INTRAVENOUS
  Filled 2021-10-30: qty 1000

## 2021-10-30 MED ORDER — TERBUTALINE SULFATE 1 MG/ML IJ SOLN: 0.2500 mg | Freq: Once | INTRAMUSCULAR | Status: DC | PRN

## 2021-10-30 MED ORDER — ACETAMINOPHEN 325 MG PO TABS
650.0000 mg | ORAL_TABLET | ORAL | Status: DC | PRN
  Filled 2021-10-30: qty 2

## 2021-10-30 MED ORDER — NIFEDIPINE ER OSMOTIC RELEASE 30 MG PO TB24
30.0000 mg | ORAL_TABLET | Freq: Every day | ORAL | Status: DC
  Administered 2021-10-30 – 2021-11-02 (×4): 30 mg via ORAL
  Filled 2021-10-30 (×4): qty 1

## 2021-10-30 MED ORDER — DOCUSATE SODIUM 100 MG PO CAPS
100.0000 mg | ORAL_CAPSULE | Freq: Every day | ORAL | Status: DC
  Filled 2021-10-30: qty 1

## 2021-10-30 MED ORDER — SCOPOLAMINE 1 MG/3DAYS TD PT72
1.0000 | MEDICATED_PATCH | TRANSDERMAL | Status: DC
  Filled 2021-10-30: qty 1

## 2021-10-30 MED ORDER — SOD CITRATE-CITRIC ACID 500-334 MG/5ML PO SOLN: 30.0000 mL | ORAL | Status: DC | PRN

## 2021-10-30 MED ORDER — NITROFURANTOIN MONOHYD MACRO 100 MG PO CAPS
100.0000 mg | ORAL_CAPSULE | Freq: Every day | ORAL | Status: DC
  Administered 2021-10-31: 100 mg via ORAL
  Filled 2021-10-30 (×2): qty 1

## 2021-10-30 MED ORDER — BETAMETHASONE SOD PHOS & ACET 6 (3-3) MG/ML IJ SUSP
12.0000 mg | INTRAMUSCULAR | Status: AC
  Administered 2021-10-30 – 2021-10-31 (×2): 12 mg via INTRAMUSCULAR
  Filled 2021-10-30: qty 5

## 2021-10-30 MED ORDER — SODIUM CHLORIDE 0.9% FLUSH: 3.0000 mL | INTRAVENOUS | Status: DC | PRN

## 2021-10-30 MED ORDER — ONDANSETRON 4 MG PO TBDP
8.0000 mg | ORAL_TABLET | Freq: Three times a day (TID) | ORAL | Status: DC | PRN
Start: 2021-10-30 — End: 2021-11-01

## 2021-10-30 MED ORDER — ONDANSETRON HCL 4 MG/2ML IJ SOLN
4.0000 mg | Freq: Four times a day (QID) | INTRAMUSCULAR | Status: DC | PRN
  Administered 2021-10-31: 4 mg via INTRAVENOUS
  Filled 2021-10-30: qty 2

## 2021-10-30 MED ORDER — SODIUM CHLORIDE 0.9 % IV SOLN: 250.0000 mL | INTRAVENOUS | Status: DC | PRN

## 2021-10-30 MED ORDER — HYDRALAZINE HCL 20 MG/ML IJ SOLN: 5.0000 mg | INTRAMUSCULAR | Status: DC | PRN

## 2021-10-30 MED ORDER — MISOPROSTOL 50MCG HALF TABLET
50.0000 ug | ORAL_TABLET | Freq: Four times a day (QID) | ORAL | Status: DC
  Administered 2021-10-30 – 2021-10-31 (×2): 50 ug via BUCCAL
  Filled 2021-10-30 (×4): qty 1

## 2021-10-30 MED ORDER — LABETALOL HCL 5 MG/ML IV SOLN: 20.0000 mg | INTRAVENOUS | Status: DC | PRN

## 2021-10-30 MED ORDER — METOCLOPRAMIDE HCL 10 MG PO TABS: 10.0000 mg | ORAL_TABLET | Freq: Four times a day (QID) | ORAL | Status: DC | PRN

## 2021-10-30 MED ORDER — LABETALOL HCL 5 MG/ML IV SOLN: 40.0000 mg | INTRAVENOUS | Status: DC | PRN

## 2021-10-30 MED ORDER — POTASSIUM CHLORIDE CRYS ER 20 MEQ PO TBCR
20.0000 meq | EXTENDED_RELEASE_TABLET | Freq: Two times a day (BID) | ORAL | Status: AC
Start: 2021-10-30 — End: 2021-11-01
  Administered 2021-10-30 – 2021-10-31 (×2): 20 meq via ORAL
  Filled 2021-10-30 (×4): qty 1

## 2021-10-30 NOTE — Plan of Care (Signed)
  Problem: Education: Goal: Knowledge of General Education information will improve Description: Including pain rating scale, medication(s)/side effects and non-pharmacologic comfort measures Outcome: Completed/Met

## 2021-10-30 NOTE — Progress Notes (Signed)
Spoke with NICU attending to confirm capacity due to high NICU census. Beds are available but rooms may need to be shared. Okay to proceed with IOL as planned. L&D charge made aware patient will be transferred for IOL. Orders placed.  Steva Ready, DO

## 2021-10-30 NOTE — H&P (Signed)
HPI: 26 y.o. G1P0000 @ [redacted]w[redacted]d estimated gestational age (as dated by LMP c/w 7 week ultrasound) presents for observation for higher than baseline blood pressures in setting of known gestational HTN and rule out preeclampsia. Patient was seen by Dr. Grace Bushy today (MFM) for ultrasound and was noted to have blood pressures in the 150s/90s. She reports a headache today as well unresolved with Fioricet so far.  Given patient's symptoms of persistent headache, FGR 1.9% with elevated dopplers - MFM agrees with proceeding with induction of labor for preeclampsia with severe features.  Leakage of fluid:  No Vaginal bleeding:  No Contractions:  No Fetal movement:  Yes  Prenatal care has been provided by Dr. Katrinka Blazing. Lawana Hartzell [facility])  ROS:  Denies fevers, chills, chest pain, visual changes, SOB, RUQ/epigastric pain, N/V, dysuria, hematuria, or sudden onset/worsening bilateral LE or facial edema.  Pregnancy complicated by: --Fetal growth restriction 1.9% with elevated dopplers  --Gestational Hypertension  --Hyperemesis gravidarum (Medication Regimen: Zofran 8mg  q8h, Phenergan 25mg  q6h, Scopolamine patch, Reglan 10mg  q6h PRN, Protonix 40mg  daily, Sucralfate PRN)  --Hx atrial fibrillation which required cardioversion in 2021 (Medication: Previously on Eliquis x 1 month and Metoprolol 25mg  daily  Cardiologist: [facility] (Dr. )  Last Echo (10/13/2019): EF 60-65%, left ventricle has normal function, sinus rhythm  Cleared by Cardiology as this was suspected to be a one time event)  --UTI x 2 this pregnancy (on Macrobid 100mg  daily suppression)  --Asthma (asymptomatic, no medications)  --Anxiety (stable, no medications)  --Ibuprofen allergy (causes vomiting)  --Sickle cell trait (FOB screened negative)  --Hx MJ use  --Hx abnormal LFTs (unclear etiology, patient reports spontaneous resolve)  --Hx pelvic fracture (Noted in Epic EHR problem list, Reviewed with  patient - reports hairline fracture as a child when bumping into a dresser)   Prenatal Transfer Tool  Maternal Diabetes: No Genetic Screening: Normal Maternal Ultrasounds/Referrals: IUGR Fetal Ultrasounds or other Referrals:  Referred to Materal Fetal Medicine  Maternal Substance Abuse:  No Significant Maternal Medications:  Meds include: Other: See above Significant Maternal Lab Results: Other: GBS Unknown   Prenatal Labs Blood type:  O Positive Antibody screen:  Negative CBC:  H/H 12.0/35.4 Rubella: Immune RPR:  Non-reactive Hep B:  Negative Hep C:  Negative HIV:  Negative GC/CT:  Negative Glucola:  118.3 (wnl)  Immunizations: Tdap: Given prenatally Flu: Given prenatally  OBHx:  OB History     Gravida  1   Para  0   Term  0   Preterm  0   AB  0   Living  0      SAB  0   IAB  0   Ectopic  0   Multiple  0   Live Births             PMHx:  See above Meds:  PNV, See above Allergy:   Allergies  Allergen Reactions   Contrast Media [Iodinated Contrast Media] Hives and Other (See Comments)    Warm feeling in back of throat 10 minutes after contrast   Ibuprofen Nausea And Vomiting   SurgHx: Diagnostic laparoscopy, bilateral feet surgery SocHx:   Denies Tobacco, ETOH, illicit drugs  O: LMP 03/09/2021  Gen. AAOx3, NAD CV.  RRR  Resp. CTAB, no wheezes/rales/rhonchi Abd. Gravid, soft, non-tender throughout, no rebound/guarding Extr.  No bilateral LE edema, no calf tenderness bilaterally SVE: deferred, pt prefers to wait until prior to IOL   Last [facility]:   Narrative & Impression  ----------------------------------------------------------------------  OBSTETRICS REPORT                       (Signed Final 10/30/2021 02:24 pm) ---------------------------------------------------------------------- Patient Info  ID #:       [id number]                          D.O.B.:  Jul 31, 1995 (25 yrs)  Name:       Joyce Franco                   Visit Date: 10/30/2021  09:42 am ---------------------------------------------------------------------- Performed By  Attending:        Lin Landsmanorenthian Booker      Ref. Address:     [facility]                    MD                                                             [address]                                                             [address]                                                             [place], [place]                                                             [phone]  Performed By:     Burt KnackJennifer Ives, RDMS    [facility]         [facility]                                                             [facility]  Referred By:      Steva Ready ---------------------------------------------------------------------- Orders  #  Description                           Code        Ordered By  1  [place] MFM FETAL BPP                      27062.3     RAVI SHANKAR     W/NONSTRESS  2  [place] MFM UA CORD DOPPLER                76820.02    RAVI [facility] ----------------------------------------------------------------------  #  Order #                     Accession #                Episode #  1  [id number]                   [id number]                 [id number]  2  [id number]                   [id number]                 [id number] ---------------------------------------------------------------------- Indications  Maternal care for known or suspected poor      O36.5930  fetal growth, third trimester, not applicable or  unspecified IUGR  Gestational hypertension without significant   O13.3  proteinuria, third trimester  NIPS LR female  Tobacco use complicating pregnancy, third      O99.333  trimester  History of sickle cell trait                   Z86.2  [redacted] weeks gestation of pregnancy                 Z3A.33 ---------------------------------------------------------------------- Fetal Evaluation  Num Of Fetuses:         1  Fetal Heart Rate(bpm):  161  Cardiac Activity:       Observed  Presentation:           Cephalic  Placenta:               Anterior  P. Cord Insertion:      Not well visualized  Amniotic Fluid  AFI FV:      Within normal limits  AFI Sum(cm)     %Tile       Largest Pocket(cm)  19.74           74          6.53  RUQ(cm)       RLQ(cm)       LUQ(cm)        LLQ(cm)  5.35          2.66          5.2            6.53 ---------------------------------------------------------------------- Biophysical Evaluation  Amniotic F.V:   Within normal limits       F. Tone:        Observed  F. Movement:    Observed                   N.S.T:          Nonreactive  F. Breathing:   Observed                   Score:          8/10 ---------------------------------------------------------------------- OB History  Gravidity:    1 ---------------------------------------------------------------------- Gestational Age  LMP:           32w 1d        Date:  03/19/21                 EDD:   12/24/21  Clinical EDD:  33w 4d                                        EDD:   12/14/21  Best:          33w 4d     Det. By:  Clinical EDD             EDD:   12/14/21 ---------------------------------------------------------------------- Doppler - Fetal Vessels  Umbilical Artery   S/D     %tile      RI    %tile      PI    %tile     PSV    ADFV    RDFV                                                     (cm/s)   6.24   > 97.5    0.84   > 97.5     1.6   > 97.5     31.7      No      No ---------------------------------------------------------------------- Cervix Uterus Adnexa  Cervix  Not visualized (advanced GA >24wks) ---------------------------------------------------------------------- Impression  MFM Brief Note  Ms. Difatta is a G1P0 at 33w 4d who is here for antenatal  testing for severe FGR.  She is seen at the  request of Dr. Steva Ready.  Today she complains of a headache with blood pressure of  150/96 and 145/97 mmHg in addition she reports decreased  fetal movement.  Antenatal testing due to FGR with known elevated UA  Dopplers  Biophysical profile 8/10 with good fetal movement and  amniotic fluid volume  UA Dopplers are normal with no evidence of AEDF or REDF  The NST was non-reactive and non-reassuring with minimal  variability.  I discussed my concern with Ms. Rieth and a family member.  She had recent labs on 06/09 that were normal. Given her  elevated blood pressure, headache, decreased fetal  movement and severe FGR- I recommended that she go to  the hospital for preeclampsia work up with a preliminary  diagnosis of preeclampsia with severe features.  I discussed this case with Dr. Connye Burkitt who is in agreement  with admission and possible delivery after BMZ if her  headache doesn't resolve and or labs return abnormal. She  will initiate magnesium sulfate for seizure prophylaxis.  All questions answered.  I spent 30 minutes with > 50% in face to face consultation  with Ms. Hollywood and care coordination.  Novella Olive, MD ----------------------------------------------------------------------              Lin Landsman, MD Electronically Signed Final Report   10/30/2021 02:24 pm    Last growth  [place] (10/27/21):  EFW 1615g, 3lbs 9oz (1.9%), AFI 12.1cm, cephalic, anterior placenta, UA S/D ratio 30.67, BPP 10/10  FHT: 145 baseline, minimal to moderate variability, + accels,  - decels Toco: none  Labs: see orders  A/P:  26 y.o. G1P0000 @ [redacted]w[redacted]d who is admitted with preeclampsia with severe features (mild range blood pressures and persistent headache).  - Admit to [facility] -- transfer to L&D - Admit labs (CBC, T&S, CMP, LDH, PCR) - CEFM/Toco - FWB:  Category II (but currently very reassuring with moderate variability) - Diet:  Regular - IVF:  SLIV - VTE  Prophylaxis:  SCDs - GBS Status:  Unknown - collected this admission - Presentation:  Cephalic - FLM: Betamethasone x 2 doses ordered - Procardia XL 30mg  daily started for Bps - Magnesium sulfate for seizure prophylaxis - Method of IOL pending cervical exam  , DO [phone] (office)

## 2021-10-30 NOTE — Progress Notes (Signed)
Dr. Grace Bushy advised Pt to [facility] for direct admit to [facility].

## 2021-10-30 NOTE — Progress Notes (Signed)
MFM Brief Note  Joyce Franco is a G1P0 at 33w 4d who is here for antenatal testing for severe FGR.  She is seen at the request of Dr. Steva Ready.  Today she complains of a headache with blood pressure of 150/96 and 145/97 mmHg in addition she reports decreased fetal movement.   Antenatal testing due to FGR with known elevated UA Dopplers Biophysical profile 8/10 with good fetal movement and amniotic fluid volume UA Dopplers are normal with no evidence of AEDF or REDF   The NST was non-reactive and non-reassuring with minimal variability.  I discussed my concern with Joyce Franco and a family member. She had recent labs on 06/09 that were normal. Given her elevated blood pressure, headache, decreased fetal movement and severe FGR- I recommended that she go to the hospital for preeclampsia work up with a preliminary diagnosis of preeclampsia with severe features.   I discussed this case with Dr. Connye Burkitt who is in agreement with admission and possible delivery after BMZ if her headache doesn't resolve and or labs return abnormal. She will initiate magnesium sulfate for seizure prophylaxis.   All questions answered.  I spent 30 minutes with > 50% in face to face consultation with Joyce Franco and care coordination.  Novella Olive, MD

## 2021-10-30 NOTE — Plan of Care (Signed)
  Problem: Education: Goal: Knowledge of disease or condition will improve Outcome: Progressing Goal: Knowledge of the prescribed therapeutic regimen will improve Outcome: Progressing Goal: Individualized Educational Video(s) Outcome: Progressing   Problem: Clinical Measurements: Goal: Complications related to the disease process, condition or treatment will be avoided or minimized Outcome: Progressing   Problem: Health Behavior/Discharge Planning: Goal: Ability to manage health-related needs will improve Outcome: Progressing   Problem: Activity: Goal: Risk for activity intolerance will decrease Outcome: Progressing   Problem: Nutrition: Goal: Adequate nutrition will be maintained Outcome: Progressing   Problem: Coping: Goal: Level of anxiety will decrease Outcome: Progressing   Problem: Elimination: Goal: Will not experience complications related to bowel motility Outcome: Progressing Goal: Will not experience complications related to urinary retention Outcome: Progressing   Problem: Pain Managment: Goal: General experience of comfort will improve Outcome: Progressing   Problem: Skin Integrity: Goal: Risk for impaired skin integrity will decrease Outcome: Progressing   Problem: Education: Goal: Knowledge of disease or condition will improve Outcome: Progressing Goal: Knowledge of the prescribed therapeutic regimen will improve Outcome: Progressing   Problem: Fluid Volume: Goal: Peripheral tissue perfusion will improve Outcome: Progressing   Problem: Clinical Measurements: Goal: Complications related to disease process, condition or treatment will be avoided or minimized Outcome: Progressing   Problem: Coping: Goal: Ability to verbalize concerns and feelings about labor and delivery will improve Outcome: Progressing   Problem: Life Cycle: Goal: Ability to make normal progression through stages of labor will improve Outcome: Progressing Goal: Ability to  effectively push during vaginal delivery will improve Outcome: Progressing   Problem: Safety: Goal: Risk of complications during labor and delivery will decrease Outcome: Progressing   Problem: Pain Management: Goal: Relief or control of pain from uterine contractions will improve Outcome: Progressing

## 2021-10-30 NOTE — Procedures (Signed)
Joyce Franco [birth year] [redacted]w[redacted]d  Fetus A Non-Stress Test Interpretation for 10/30/21  Indication: IUGR  Fetal Heart Rate A Mode: External Baseline Rate (A): 155 bpm Variability: Absent, Minimal Accelerations: 10 x 10 (1 10 X 10 during NST) Decelerations: None Multiple birth?: No  Uterine Activity Mode: Toco Contraction Frequency (min): none Resting Tone Palpated: Relaxed  Interpretation (Fetal Testing) Nonstress Test Interpretation: Non-reactive Overall Impression: Non-reassuring Comments: tracing reviewed by Dr. Grace Bushy

## 2021-10-31 ENCOUNTER — Inpatient Hospital Stay (HOSPITAL_COMMUNITY): Payer: Medicaid Other | Admitting: Anesthesiology

## 2021-10-31 ENCOUNTER — Encounter (HOSPITAL_COMMUNITY): Payer: Self-pay | Admitting: Obstetrics and Gynecology

## 2021-10-31 LAB — CBC
HCT: 33 % — ABNORMAL LOW (ref 36.0–46.0)
HCT: 34.3 % — ABNORMAL LOW (ref 36.0–46.0)
Hemoglobin: 11.8 g/dL — ABNORMAL LOW (ref 12.0–15.0)
Hemoglobin: 12.1 g/dL (ref 12.0–15.0)
MCH: 28.6 pg (ref 26.0–34.0)
MCH: 28.5 pg (ref 26.0–34.0)
MCHC: 35.8 g/dL (ref 30.0–36.0)
MCHC: 35.3 g/dL (ref 30.0–36.0)
MCV: 80.1 fL (ref 80.0–100.0)
MCV: 80.9 fL (ref 80.0–100.0)
Platelets: 288 10*3/uL (ref 150–400)
Platelets: 302 10*3/uL (ref 150–400)
RBC: 4.12 MIL/uL (ref 3.87–5.11)
RBC: 4.24 MIL/uL (ref 3.87–5.11)
RDW: 13 % (ref 11.5–15.5)
RDW: 13.2 % (ref 11.5–15.5)
WBC: 14.8 10*3/uL — ABNORMAL HIGH (ref 4.0–10.5)
WBC: 15.2 10*3/uL — ABNORMAL HIGH (ref 4.0–10.5)
nRBC: 0.1 % (ref 0.0–0.2)
nRBC: 0.1 % (ref 0.0–0.2)

## 2021-10-31 LAB — COMPREHENSIVE METABOLIC PANEL
ALT: 16 U/L (ref 0–44)
AST: 21 U/L (ref 15–41)
Albumin: 2.7 g/dL — ABNORMAL LOW (ref 3.5–5.0)
Alkaline Phosphatase: 166 U/L — ABNORMAL HIGH (ref 38–126)
Anion gap: 9 (ref 5–15)
BUN: 5 mg/dL — ABNORMAL LOW (ref 6–20)
CO2: 22 mmol/L (ref 22–32)
Calcium: 7.1 mg/dL — ABNORMAL LOW (ref 8.9–10.3)
Chloride: 104 mmol/L (ref 98–111)
Creatinine, Ser: 0.64 mg/dL (ref 0.44–1.00)
GFR, Estimated: >60 mL/min (ref >60)
Glucose, Bld: 127 mg/dL — ABNORMAL HIGH (ref 70–99)
Potassium: 3.6 mmol/L (ref 3.5–5.1)
Sodium: 135 mmol/L (ref 135–145)
Total Bilirubin: 0.3 mg/dL (ref 0.3–1.2)
Total Protein: 5.7 g/dL — ABNORMAL LOW (ref 6.5–8.1)

## 2021-10-31 LAB — GC/CHLAMYDIA PROBE AMP (~~LOC~~) NOT AT ARMC
Chlamydia: NEGATIVE
Comment: NEGATIVE
Comment: NORMAL
Neisseria Gonorrhea: NEGATIVE

## 2021-10-31 LAB — RPR: RPR Ser Ql: NONREACTIVE

## 2021-10-31 MED ORDER — LIDOCAINE HCL (PF) 1 % IJ SOLN
INTRAMUSCULAR | Status: DC | PRN
  Administered 2021-10-31: 6 mL via EPIDURAL
  Administered 2021-10-31: 4 mL via EPIDURAL

## 2021-10-31 MED ORDER — PHENYLEPHRINE 80 MCG/ML (10ML) SYRINGE FOR IV PUSH (FOR BLOOD PRESSURE SUPPORT): 80.0000 ug | PREFILLED_SYRINGE | INTRAVENOUS | Status: DC | PRN

## 2021-10-31 MED ORDER — DIPHENHYDRAMINE HCL 50 MG/ML IJ SOLN: 12.5000 mg | INTRAMUSCULAR | Status: DC | PRN

## 2021-10-31 MED ORDER — MISOPROSTOL 25 MCG QUARTER TABLET
25.0000 ug | ORAL_TABLET | Freq: Once | ORAL | Status: AC
  Administered 2021-10-31: 25 ug via VAGINAL
  Filled 2021-10-31: qty 1

## 2021-10-31 MED ORDER — FENTANYL-BUPIVACAINE-NACL 0.5-0.125-0.9 MG/250ML-% EP SOLN
12.0000 mL/h | EPIDURAL | Status: DC | PRN
  Administered 2021-10-31: 12 mL/h via EPIDURAL
  Filled 2021-10-31: qty 250

## 2021-10-31 MED ORDER — EPHEDRINE 5 MG/ML INJ: 10.0000 mg | INTRAVENOUS | Status: DC | PRN

## 2021-10-31 MED ORDER — LACTATED RINGERS IV SOLN
500.0000 mL | Freq: Once | INTRAVENOUS | Status: AC
  Administered 2021-10-31: 500 mL via INTRAVENOUS

## 2021-10-31 MED ORDER — MISOPROSTOL 50MCG HALF TABLET
50.0000 ug | ORAL_TABLET | ORAL | Status: DC | PRN
  Administered 2021-10-31: 50 ug via BUCCAL

## 2021-10-31 MED ORDER — PHENYLEPHRINE 80 MCG/ML (10ML) SYRINGE FOR IV PUSH (FOR BLOOD PRESSURE SUPPORT)
80.0000 ug | PREFILLED_SYRINGE | INTRAVENOUS | Status: DC | PRN
  Filled 2021-10-31: qty 10

## 2021-10-31 NOTE — Progress Notes (Signed)
Subjective:    Agrees to cervical exam and tolerated with guidance for relaxation.   Objective:    VS: BP 136/69   Pulse 88   Temp 97.6 F (36.4 C) (Axillary)   Resp 16   Ht 5\' 4"  (1.626 m)   Wt 85.1 kg   LMP 03/09/2021   SpO2 99%   BMI 32.20 kg/m  FHR : baseline 125 / variability moderate / accelerations present / absent decelerations Toco: contractions occasional Membranes: intact Dilation: Closed Effacement (%): Thick Station: Ballotable Presentation: Vertex (Confirmed Via Ultrasound) Exam by:: [email or web address], CNM   Assessment/Plan:   26 y.o. G1P0000 [redacted]w[redacted]d  IOL for pre-e w/ severe features (mild range BP and HA) IUGR- 1.6%ile   Labor:  administer misoprostol #2 Preeclampsia:  on magnesium sulfate, no signs or symptoms of toxicity, intake and ouput balanced, and labs stable, mild range BP since last note, Procardia XL 30 mg daily Fetal Wellbeing:  Category I, BMZ 1st dose 1216 on 6/29 Pain Control:   plans nitrous and epidural I/D:   GBS unknown, culture pending, PCN prophylaxis Anticipated MOD:  NSVD  7/29 DNP, CNM 28/30/2023 4:30 AM

## 2021-10-31 NOTE — Progress Notes (Addendum)
  Subjective: Patient denies current headache. No visual disturbance or ruq pain. +FM . Contractions every 2-5 minutes mild.  No LOF no vaginal bleeding   Objective: BP (!) 131/108 (BP Location: Left Arm)   Pulse (!) 102   Temp 99.1 F (37.3 C) (Oral)   Resp 17   Ht 5\' 4"  (1.626 m)   Wt 85.1 kg   LMP 03/09/2021   SpO2 100%   BMI 32.20 kg/m  I/O last 3 completed shifts: In: 3792.7 [P.O.:1634; I.V.:2050.4; IV Piggyback:108.3] Out: 2450 [Urine:2450] Total I/O In: 700 [I.V.:700] Out: 1000 [Urine:1000]  FHT:  FHR: 130 bpm, variability: moderate,  accelerations:  Present,  decelerations:  Absent UC:   irregular, every 2-6 minutes SVE:  1/80/-2  Labs: Lab Results  Component Value Date   WBC 11.7 (H) 10/30/2021   HGB 11.5 (L) 10/30/2021   HCT 32.3 (L) 10/30/2021   MCV 80.1 10/30/2021   PLT 290 10/30/2021   Results for orders placed or performed during the hospital encounter of 10/30/21 (from the past 24 hour(s))  Protein / creatinine ratio, urine     Status: None   Collection Time: 10/30/21  3:29 PM  Result Value Ref Range   Creatinine, Urine 154.17 mg/dL   Total Protein, Urine 23 mg/dL   Protein Creatinine Ratio 0.15 0.00 - 0.15 mg/mg[Cre]  RPR     Status: None   Collection Time: 10/30/21  6:30 PM  Result Value Ref Range   RPR Ser Ql NON REACTIVE NON REACTIVE    Assessment / Plan: Induction of labor due to preeclampsia with severe features Patient is s/p buccal cytotec x 3  Labor:  cytotec 25 mcg per vagina  Preeclampsia:  on magnesium sulfate, no signs or symptoms of toxicity, intake and ouput balanced, and labs stable Fetal Wellbeing:  Category I Pain Control:  Labor support without medications I/D:   Penicillin Anticipated MOD:  NSVD  11/01/21, MD 10/31/2021, 12:41 PM

## 2021-10-31 NOTE — Anesthesia Preprocedure Evaluation (Signed)
Anesthesia Evaluation  Patient identified by MRN, date of birth, ID band Patient awake    Reviewed: Allergy & Precautions, H&P , NPO status , Patient's Chart, lab work & pertinent test results  History of Anesthesia Complications Negative for: history of anesthetic complications  Airway Mallampati: II  TM Distance: >3 FB     Dental   Pulmonary asthma , former smoker,    Pulmonary exam normal        Cardiovascular hypertension, + dysrhythmias Atrial Fibrillation  Rhythm:regular Rate:Normal     Neuro/Psych negative neurological ROS  negative psych ROS   GI/Hepatic negative GI ROS, Neg liver ROS,   Endo/Other  negative endocrine ROS  Renal/GU negative Renal ROS  negative genitourinary   Musculoskeletal   Abdominal   Peds  Hematology negative hematology ROS (+)   Anesthesia Other Findings Severe preeclampsia on mag  Reproductive/Obstetrics (+) Pregnancy                            Anesthesia Physical Anesthesia Plan  ASA: 3  Anesthesia Plan: Epidural   Post-op Pain Management:    Induction:   PONV Risk Score and Plan:   Airway Management Planned:   Additional Equipment:   Intra-op Plan:   Post-operative Plan:   Informed Consent: I have reviewed the patients History and Physical, chart, labs and discussed the procedure including the risks, benefits and alternatives for the proposed anesthesia with the patient or authorized representative who has indicated his/her understanding and acceptance.       Plan Discussed with:   Anesthesia Plan Comments:         Anesthesia Quick Evaluation

## 2021-10-31 NOTE — Progress Notes (Signed)
Joyce Franco is a 26 y.o. G1P0000 at [redacted]w[redacted]d by LMP admitted for induction of labor due to preeclampsia with severe features .  Subjective: Patient reports painful contractions nolof no vaginal bleeding. +FM. No headache or visual disturbances currently.   Objective: BP (!) 152/90   Pulse 97   Temp 99.1 F (37.3 C) (Oral)   Resp 18   Ht 5\' 4"  (1.626 m)   Wt 85.1 kg   LMP 03/09/2021   SpO2 99%   BMI 32.20 kg/m  I/O last 3 completed shifts: In: 3792.7 [P.O.:1634; I.V.:2050.4; IV Piggyback:108.3] Out: 2450 [Urine:2450] Total I/O In: 1474.8 [I.V.:1224.8; IV Piggyback:250] Out: 2450 [Urine:2450]  FHT:  FHR: 130 bpm, variability: moderate,  accelerations:  Present,  decelerations:  Present variable .13/10/2020 UC:   regular, every 2-3 minutes SVE:   Dilation: 2.5 Effacement (%): 80 Station: -2 Exam by:: Dr. [email or web address]  Labs: Lab Results  Component Value Date   WBC 14.8 (H) 10/31/2021   HGB 11.8 (L) 10/31/2021   HCT 33.0 (L) 10/31/2021   MCV 80.1 10/31/2021   PLT 288 10/31/2021    Assessment / Plan: 33 weeks and 5 days with preeclampsia with severe features based on neurologic symptoms   Labor:  currently on 16 mU of pitocin. S/p cytotec buccal x 3 and vaginal x 1..  Preeclampsia:  on magnesium sulfate, no signs or symptoms of toxicity, intake and ouput balanced, and labs stable Fetal Wellbeing:  Category I and Category II Pain Control:   planning an epidural  I/D:   penicillin Anticipated MOD:  NSVD  CCOB Dr. 11/02/2021 covering after 530 pm today   Sallye Ober, MD 10/31/2021, 5:27 PM

## 2021-10-31 NOTE — Anesthesia Procedure Notes (Signed)
Epidural Patient location during procedure: OB Start time: 10/31/2021 6:12 PM End time: 10/31/2021 6:24 PM  Staffing Anesthesiologist: Lucretia Kern, MD Performed: anesthesiologist   Preanesthetic Checklist Completed: patient identified, IV checked, risks and benefits discussed, monitors and equipment checked, pre-op evaluation and timeout performed  Epidural Patient position: sitting Prep: DuraPrep Patient monitoring: heart rate, continuous pulse ox and blood pressure Approach: midline Location: L3-L4 Injection technique: LOR air  Needle:  Needle type: Tuohy  Needle gauge: 17 G Needle length: 9 cm Needle insertion depth: 6 cm Catheter type: closed end flexible Catheter size: 19 Gauge Catheter at skin depth: 11 cm Test dose: negative  Assessment Events: blood not aspirated, injection not painful, no injection resistance, no paresthesia and negative IV test  Additional Notes Reason for block:procedure for pain

## 2021-10-31 NOTE — Progress Notes (Addendum)
Subjective:    Pt denies feeling cramping or contractions. Abs soft, pt reports feeling fetal movement. HA well-managed with Fioricet, reports seeing "sparkles" for >1 month with no current changes, denies epigastric pain. Discussed plan to perform a bedside US to confirm presentation before next Cytotec and pt agrees.   Objective:    VS: BP 127/74   Pulse 87   Temp 97.6 F (36.4 C) (Axillary)   Resp 18   Ht 5\' 4"  (1.626 m)   Wt 85.1 kg   LMP 03/09/2021   SpO2 99%   BMI 32.20 kg/m  FHR : baseline 125 / variability minimal to moderate / accelerations present / absent decelerations Toco: occasional ctx Membranes: intact VE deferred Per 13/10/2020 CNM, Ultrasound confirmed vertex presentation  Labs: CMP     Component Value Date/Time   NA 139 10/30/2021 1206   K 3.2 (L) 10/30/2021 1206   CL 107 10/30/2021 1206   CO2 20 (L) 10/30/2021 1206   GLUCOSE 77 10/30/2021 1206   BUN 10 10/30/2021 1206   CREATININE 0.64 10/30/2021 1206   CALCIUM 8.8 (L) 10/30/2021 1206   PROT 6.1 (L) 10/30/2021 1206   ALBUMIN 2.8 (L) 10/30/2021 1206   AST 18 10/30/2021 1206   ALT 15 10/30/2021 1206   ALKPHOS 178 (H) 10/30/2021 1206   BILITOT 0.5 10/30/2021 1206   GFRNONAA >60 10/30/2021 1206   GFRAA >60 11/18/2019 1823   CBC    Component Value Date/Time   WBC 11.7 (H) 10/30/2021 1206   RBC 4.03 10/30/2021 1206   HGB 11.5 (L) 10/30/2021 1206   HCT 32.3 (L) 10/30/2021 1206   PLT 290 10/30/2021 1206   MCV 80.1 10/30/2021 1206   MCH 28.5 10/30/2021 1206   MCHC 35.6 10/30/2021 1206   RDW 13.0 10/30/2021 1206   LYMPHSABS 2.2 04/12/2021 1410   MONOABS 0.6 04/12/2021 1410   EOSABS 0.0 04/12/2021 1410   BASOSABS 0.0 04/12/2021 1410   PCR 0.15 Assessment/Plan:   26 y.o. G1P0000 [redacted]w[redacted]d IOL for pre-e w/ severe features (mild range BP and HA) IUGR- 1.6%ile  Labor:  administer misoprostol #2 Preeclampsia:  on magnesium sulfate, no signs or symptoms of toxicity, intake and ouput balanced, and labs  stable, normotensive to mild range BP since MgSO4 was started, Procardia XL 30 mg daily Fetal Wellbeing:  Category I, BMZ 1st dose 1216 on 6/29, vertex per bedside 7/29 Pain Control:   plans nitrous and epidural I/D:   GBS unknown, culture pending, PCN prophylaxis Anticipated MOD:  NSVD  [place] DNP, CNM 10/31/2021 12:32 AM

## 2021-11-01 LAB — COMPREHENSIVE METABOLIC PANEL
ALT: 14 U/L (ref 0–44)
AST: 18 U/L (ref 15–41)
Albumin: 2.5 g/dL — ABNORMAL LOW (ref 3.5–5.0)
Alkaline Phosphatase: 139 U/L — ABNORMAL HIGH (ref 38–126)
Anion gap: 4 — ABNORMAL LOW (ref 5–15)
BUN: <5 mg/dL — ABNORMAL LOW (ref 6–20)
CO2: 23 mmol/L (ref 22–32)
Calcium: 6.9 mg/dL — ABNORMAL LOW (ref 8.9–10.3)
Chloride: 107 mmol/L (ref 98–111)
Creatinine, Ser: 0.66 mg/dL (ref 0.44–1.00)
GFR, Estimated: >60 mL/min (ref >60)
Glucose, Bld: 83 mg/dL (ref 70–99)
Potassium: 4 mmol/L (ref 3.5–5.1)
Sodium: 134 mmol/L — ABNORMAL LOW (ref 135–145)
Total Bilirubin: 0.2 mg/dL — ABNORMAL LOW (ref 0.3–1.2)
Total Protein: 5.4 g/dL — ABNORMAL LOW (ref 6.5–8.1)

## 2021-11-01 LAB — CBC
HCT: 29.7 % — ABNORMAL LOW (ref 36.0–46.0)
Hemoglobin: 10.5 g/dL — ABNORMAL LOW (ref 12.0–15.0)
MCH: 28.4 pg (ref 26.0–34.0)
MCHC: 35.4 g/dL (ref 30.0–36.0)
MCV: 80.3 fL (ref 80.0–100.0)
Platelets: 284 10*3/uL (ref 150–400)
RBC: 3.7 MIL/uL — ABNORMAL LOW (ref 3.87–5.11)
RDW: 13.1 % (ref 11.5–15.5)
WBC: 20.1 10*3/uL — ABNORMAL HIGH (ref 4.0–10.5)
nRBC: 0 % (ref 0.0–0.2)

## 2021-11-01 LAB — CULTURE, BETA STREP (GROUP B ONLY)

## 2021-11-01 MED ORDER — SIMETHICONE 80 MG PO CHEW: 80.0000 mg | CHEWABLE_TABLET | ORAL | Status: DC | PRN

## 2021-11-01 MED ORDER — PRENATAL MULTIVITAMIN CH
1.0000 | ORAL_TABLET | Freq: Every day | ORAL | Status: DC
  Administered 2021-11-01 – 2021-11-05 (×5): 1 via ORAL
  Filled 2021-11-01 (×5): qty 1

## 2021-11-01 MED ORDER — SENNOSIDES-DOCUSATE SODIUM 8.6-50 MG PO TABS
2.0000 | ORAL_TABLET | ORAL | Status: DC
  Administered 2021-11-02 – 2021-11-03 (×3): 2 via ORAL
  Filled 2021-11-01 (×4): qty 2

## 2021-11-01 MED ORDER — COCONUT OIL OIL: 1.0000 | TOPICAL_OIL | Status: DC | PRN

## 2021-11-01 MED ORDER — TETANUS-DIPHTH-ACELL PERTUSSIS 5-2.5-18.5 LF-MCG/0.5 IM SUSY: 0.5000 mL | PREFILLED_SYRINGE | Freq: Once | INTRAMUSCULAR | Status: DC

## 2021-11-01 MED ORDER — ACETAMINOPHEN 325 MG PO TABS
650.0000 mg | ORAL_TABLET | ORAL | Status: DC | PRN
  Administered 2021-11-02 – 2021-11-04 (×5): 650 mg via ORAL
  Filled 2021-11-01 (×4): qty 2

## 2021-11-01 MED ORDER — ZOLPIDEM TARTRATE 5 MG PO TABS: 5.0000 mg | ORAL_TABLET | Freq: Every evening | ORAL | Status: DC | PRN

## 2021-11-01 MED ORDER — PROMETHAZINE HCL 25 MG PO TABS: 25.0000 mg | ORAL_TABLET | Freq: Four times a day (QID) | ORAL | Status: DC | PRN

## 2021-11-01 MED ORDER — DIBUCAINE (PERIANAL) 1 % EX OINT: 1.0000 | TOPICAL_OINTMENT | CUTANEOUS | Status: DC | PRN

## 2021-11-01 MED ORDER — BENZOCAINE-MENTHOL 20-0.5 % EX AERO: 1.0000 | INHALATION_SPRAY | CUTANEOUS | Status: DC | PRN

## 2021-11-01 MED ORDER — WITCH HAZEL-GLYCERIN EX PADS: 1.0000 | MEDICATED_PAD | CUTANEOUS | Status: DC | PRN

## 2021-11-01 MED ORDER — DIPHENHYDRAMINE HCL 25 MG PO CAPS
25.0000 mg | ORAL_CAPSULE | Freq: Four times a day (QID) | ORAL | Status: DC | PRN
  Administered 2021-11-05: 25 mg via ORAL
  Filled 2021-11-01: qty 1

## 2021-11-01 MED ORDER — OXYCODONE-ACETAMINOPHEN 5-325 MG PO TABS
1.0000 | ORAL_TABLET | ORAL | Status: DC | PRN
  Administered 2021-11-05: 1 via ORAL
  Filled 2021-11-01: qty 1

## 2021-11-01 NOTE — Lactation Note (Addendum)
This note was copied from a baby's chart.  NICU Lactation Consultation Note  Patient Name: Joyce Franco MYTRZ'N Date: 11/01/2021 Age:26 years   Subjective Reason for consult: Initial assessment; 1st time breastfeeding; NICU baby; Preterm <34wks  Lactation conducted initial consult with Ms. Eick. I assisted her with her first pumping session (12 hours postpartum) and flange fitting. Drops of colostrum noted. I reviewed the [facility] guide for NICU.  Ms. Arrey is interested in breastfeeding baby "Torri" and would like to practice lick and learn when baby is ready.  Lactation to follow up and monitor progress.  Objective Infant data: Mother's Current Feeding Choice: Breast Milk  Infant feeding assessment  Maternal data: G1P0101  Vaginal, Spontaneous Current breast feeding challenges:: NICU; pre-e; separaton  Does the patient have breastfeeding experience prior to this delivery?: No  Pumping frequency: rec 8 times a day; q3 hours Pumped volume: 0 mL (droplets) Flange Size: 24  WIC Referral Sent?: Yes  Assessment Infant: Feeding Status: NPO   Maternal: Milk volume: Normal   Intervention/Plan Interventions: Breast feeding basics reviewed; Hand express; DEBP; Education; "The NICU and Your Baby" book  Tools: Pump; Flanges Pump Education: Setup, frequency, and cleaning  Plan: Consult Status: NICU follow-up  NICU Follow-up type: New admission follow up    Walker Shadow 11/01/2021, 10:29 AM

## 2021-11-01 NOTE — Progress Notes (Signed)
Post Partum Day 1 Subjective: no complaints and tolerating PO  She denies fevers, chills, nausea, vomiting, chest pain or shortness of breath.   Objective: Blood pressure (!) 141/99, pulse (!) 104, temperature 97.8 F (36.6 C), temperature source Oral, resp. rate 14, height 5\' 4"  (1.626 m), weight 85.1 kg, last menstrual period 03/09/2021, SpO2 100 %, unknown if currently breastfeeding.    11/01/2021   11:27 AM 11/01/2021    7:26 AM 11/01/2021    4:00 AM  Vitals with BMI  Systolic 141 139 01/02/2022  Diastolic 99 95 78  Pulse 104 103 78      06/29 0700  06/30 0659 06/30 0700  07/01 0659 07/01 0700  07/02 0659  P.O. 1514 120 240  I.V. (mL/kg) 1933.1 (22.7) 1738.5 (20.4)   IV Piggyback 108.3 250   Total Intake(mL/kg) 3555.4 (41.8) 2108.5 (24.8) 240 (2.8)  Urine (mL/kg/hr) 2450 4725 (2.3) 1225 (2.5)  Stool  650   Blood  250   Total Output 2450 5625 1225  Net +1105.4 -3516.5 -985   Physical Exam:  General: alert, cooperative, and no distress Lochia: appropriate Uterine Fundus: firm DVT Evaluation: No evidence of DVT seen on physical exam. CVS: S1, s2, RRR Pulmonary: Clear to auscultation bilaterally. Extremities: Warm and well perfused, no edema. 1+ patellar reflex bilaterally. CBC    Component Value Date/Time   WBC 20.1 (H) 11/01/2021 0405   RBC 3.70 (L) 11/01/2021 0405   HGB 10.5 (L) 11/01/2021 0405   HCT 29.7 (L) 11/01/2021 0405   PLT 284 11/01/2021 0405   MCV 80.3 11/01/2021 0405   MCH 28.4 11/01/2021 0405   MCHC 35.4 11/01/2021 0405   RDW 13.1 11/01/2021 0405   LYMPHSABS 2.2 04/12/2021 1410   MONOABS 0.6 04/12/2021 1410   EOSABS 0.0 04/12/2021 1410   BASOSABS 0.0 04/12/2021 1410    CMP     Component Value Date/Time   NA 134 (L) 11/01/2021 0405   K 4.0 11/01/2021 0405   CL 107 11/01/2021 0405   CO2 23 11/01/2021 0405   GLUCOSE 83 11/01/2021 0405   BUN <5 (L) 11/01/2021 0405   CREATININE 0.66 11/01/2021 0405   CALCIUM 6.9 (L) 11/01/2021 0405   PROT 5.4 (L)  11/01/2021 0405   ALBUMIN 2.5 (L) 11/01/2021 0405   AST 18 11/01/2021 0405   ALT 14 11/01/2021 0405   ALKPHOS 139 (H) 11/01/2021 0405   BILITOT 0.2 (L) 11/01/2021 0405   GFRNONAA >60 11/01/2021 0405   GFRAA >60 11/18/2019 1823    Recent Labs    10/31/21 2245 11/01/21 0405  HGB 12.1 10.5*  HCT 34.3* 29.7*    Assessment/Plan: Plan for discharge tomorrow and Social Work consult 25 y/o P1 PPD 1 s/p vaginal delivery, after induction of labor for preeclampsia with severe features, stable status - Continue with magnesium sulfate for 24 hours postpartum. - Continue with monitoring for signs and symptoms of preeclampsia as well as magnesium toxicity. -Continue with procardia for BP management.   LOS: 2 days  01/02/22, MD 11/01/2021, 12:37 PM

## 2021-11-01 NOTE — Anesthesia Postprocedure Evaluation (Signed)
Anesthesia Post Note  Patient: Joyce Franco  Procedure(s) Performed: AN AD HOC LABOR EPIDURAL     Patient location during evaluation: Mother Baby Anesthesia Type: Epidural Level of consciousness: awake Pain management: satisfactory to patient Vital Signs Assessment: post-procedure vital signs reviewed and stable Respiratory status: spontaneous breathing Cardiovascular status: stable Anesthetic complications: no   No notable events documented.  Last Vitals:  Vitals:   11/01/21 0726 11/01/21 1127  BP: (!) 139/95 (!) 141/99  Pulse: (!) 103 (!) 104  Resp: 16 14  Temp: 36.9 C 36.6 C  SpO2: 100% 100%    Last Pain:  Vitals:   11/01/21 1127  TempSrc: Oral  PainSc:    Pain Goal: Patients Stated Pain Goal: 4 (10/30/21 1144)                 Cephus Shelling

## 2021-11-02 MED ORDER — NIFEDIPINE ER OSMOTIC RELEASE 30 MG PO TB24
30.0000 mg | ORAL_TABLET | Freq: Once | ORAL | Status: AC
Start: 2021-11-02 — End: 2021-11-02
  Administered 2021-11-02: 30 mg via ORAL
  Filled 2021-11-02: qty 1

## 2021-11-02 MED ORDER — NIFEDIPINE ER OSMOTIC RELEASE 60 MG PO TB24
90.0000 mg | ORAL_TABLET | Freq: Every day | ORAL | Status: DC
Start: 2021-11-03 — End: 2021-11-03

## 2021-11-02 MED ORDER — NIFEDIPINE ER OSMOTIC RELEASE 30 MG PO TB24
30.0000 mg | ORAL_TABLET | Freq: Once | ORAL | Status: DC
Start: 2021-11-02 — End: 2021-11-02

## 2021-11-02 MED ORDER — NIFEDIPINE ER OSMOTIC RELEASE 60 MG PO TB24: 60.0000 mg | ORAL_TABLET | Freq: Every day | ORAL | Status: DC

## 2021-11-02 NOTE — Progress Notes (Addendum)
Post Partum Day 2 Subjective: no complaints, up ad lib, voiding, and tolerating PO  Objective: Blood pressure (!) 157/93, pulse (!) 103, temperature 98.3 F (36.8 C), temperature source Oral, resp. rate 16, height 5\' 4"  (1.626 m), weight 85.1 kg, last menstrual period 03/09/2021, SpO2 98 %, unknown if currently breastfeeding.     11/02/2021    7:32 PM 11/02/2021    3:42 PM 11/02/2021   11:36 AM  Vitals with BMI  Systolic 153 157 01/03/2022  Diastolic 86 93 92  Pulse 96 103 93    Physical Exam:  General: alert, cooperative, and no distress Lochia: appropriate Uterine Fundus: firm Incision: None DVT Evaluation: No evidence of DVT seen on physical exam. No significant calf/ankle edema.  Recent Labs    10/31/21 2245 11/01/21 0405  HGB 12.1 10.5*  HCT 34.3* 29.7*    06/30 0700  07/01 0659 07/01 0700  07/02 0659 07/02 0700  07/03 0659  P.O. 120 1800 0  I.V. (mL/kg) 1738.5 (20.4) 1967.8 (23.1)   IV Piggyback 250    Total Intake(mL/kg) 2108.5 (24.8) 3767.8 (44.3) 0 (0)  Urine (mL/kg/hr) 4725 (2.3) 2975 (1.5) 0 (0)  Stool 650    Blood 250    Total Output 5625 2975 0  Net -3516.5 +792.8 0       Urine Occurrence  1 x     Assessment/Plan: 26 y/o P1 PPD 1 s/p vaginal delivery, after induction of labor for preeclampsia with severe features, s/p 24 hrs of magnesium sulfate, with  continued elevated blood pressures,  - Patient originally on Procardia XL 30 mg Q daily. She was administered an extra 30 mg tablet today at 11 am due to elevated blood pressures.  Will plan on increasing dosage by another Procardia XL 30 mg tonight if with continued uncontrolled high BP. - Will defer discharge planning to tomorrow. - s/p social work consult.   LOS: 3 days   34, MD 11/02/2021, 7:30 PM

## 2021-11-02 NOTE — Clinical Social Work Maternal (Signed)
CLINICAL SOCIAL WORK MATERNAL/CHILD NOTE  Patient Details  Name: Joyce Franco MRN: [id number] Date of Birth: October 03, 1995  Date:  11/02/2021  Clinical Social Worker Initiating Note:  Idamae Lusher Date/Time: Initiated:  11/02/21/1716     Child's Name:  Joyce Franco   Biological Parents:  Mother, Father (FOB not present during assessment, Joyce Franco DOB 04/08/1988)   Need for Interpreter:  None   Reason for Referral:  Current Substance Use/Substance Use During Pregnancy  , Parental Support of Premature Babies < 32 weeks/or Critically Ill babies   Address:  [place] [address]    Phone number:  [phone] (home)     Additional phone number:   Household Members/Support Persons (HM/SP):   Household Member/Support Person 1   HM/SP Name Relationship DOB or Age  HM/SP -1 Joyce Franco Mother    HM/SP -2        HM/SP -3        HM/SP -4        HM/SP -5        HM/SP -6        HM/SP -7        HM/SP -8          Natural Supports (not living in the home):  Spouse/significant other   Professional Supports: None   Employment:   Not assessed   Type of Work:   Not assessed  Education:    Not assessed  Homebound arranged:    Museum/gallery curator Resources:  Medicaid   Other Resources:   ([place] referral placed by Lactation)   Cultural/Religious Considerations Which May Impact Care:  None identified  Strengths:  Ability to meet basic needs  , Compliance with medical plan     Psychotropic Medications:         Pediatrician:     Provided list  Pediatrician List:   [place]      Pediatrician Fax Number:    Risk Factors/Current Problems:  Substance Use  , Mental Health Concerns     Cognitive State:  Able to Concentrate  , Linear Thinking  , Alert  , Goal Oriented     Mood/Affect:  Calm  , Relaxed  , Overwhelmed  , Comfortable     CSW Assessment: CSW met with MOB at  bedside to complete assessment due to NICU admission, history of substance use during pregnancy, and history of anxiety. When CSW entered the room, MOB was observed sitting in hospital bed pumping. MOB's mother was sitting on chair nearby. CSW asked to speak with MOB alone; however, MOB provided verbal consent for CSW to speak in front of her mother about anything. MOB presented as calm and cooperative and remained engaged throughout assessment.   CSW inquired about MOB's mental health history. MOB confirms that she has a history of anxiety, reporting that her symptoms of anxiety began during her pregnancy. MOB attributes her anxiety as situational due to being sick during her pregnancy. MOB reports anxiety symptoms marked by worrying, feeling jitteriness, racing thoughts, and difficulty sleeping. CSW inquired how MOB has been feeling emotionally since giving birth. MOB reports she continues to experience feelings of anxiety, reporting feeling "a different type of anxiety being in the NICU." MOB shared she is "trying to adjust to having a baby in the NICU" and feeling "tired". CSW normalized MOB's feelings and provided  emotional validation. CSW inquired about MOB's coping skills. MOB reports she has been unable to do much due to being on bedrest but was able to play with her dogs, walk, and spend time outside to cope with anxiety symptoms. MOB denies current SI/HI/DV. MOB reports she is not in therapy or taking psychotropic medications but may be interested in trying both. CSW provided MOB with a list of mental health resources. MOB reports she receives support from her mother and FOB.   CSW informed MOB about hospital drug screen policy due to documented use of MOB using marijuana in early pregnancy. CSW explained that infant's CDS would be monitored and a CPS report would be made if warranted. MOB provided verbal understanding. CSW inquired about MOB's substance use history. MOB reports she smoked marijuana  during pregnancy, last use was "the other day." MOB denies using any other illicit substances. Per MOB's prenatal care records, MOB smoked marijuana for nausea.   CSW and MOB discussed infant's NICU admission. CSW informed MOB about the NICU, what to expect, and resources/supports available while infant is admitted to the NICU. MOB reported that she feels well informed about infant's care. CSW explained that she will be assigned a CSW during her infant's stay in the NICU and MOB requested that CSW reach out to her every few days. MOB expressed feelings of sadness, grief, and worry about infant being in NICU and not being able to take maternity pictures or have a baby shower. MOB reports she plans to have a baby shower at a later time. CSW validated and normalized MOB's feelings, encouraging her to continue to process her emotions. MOB denied any transportation barriers visiting infant in the NICU. MOB reported that meal vouchers would be helpful when she is discharged from the hospital. MOB reports she has some baby items, including a basinet. However, MOB reports she was not able to have a baby shower yet and is still in need of a car seat and preemie clothes. CSW offered to make a referral to [employer], MOB expressed appreciation. Per chart review, lactation placed a [facility] referral for MOB. Food Stamps were not asked about. CSW provided MOB with a pediatrician list. CSW encouraged MOB inform CSW about any additional resource needs.   CSW did not review SIDS (Sudden Infant Death Syndrome) due to infant being in the NICU.    CSW Plan/Description:  Perinatal Mood and Anxiety Disorder (PMADs) Education, [place], Other Information/Referral to [employer], CSW Will Continue to Monitor Umbilical Cord Tissue Drug Screen Results and Make Report if Warranted, No Further Intervention Required/No Barriers to Discharge, Psychosocial Support and Ongoing Assessment of [place], LCSWA 11/02/2021, 5:21 PM

## 2021-11-02 NOTE — Lactation Note (Signed)
This note was copied from a baby's chart.  NICU Lactation Consultation Note  Patient Name: Joyce Franco [id number] Date: 11/02/2021 Age:28 hours   Subjective Reason for consult: Follow-up assessment; NICU baby; Preterm <34wks  Lactation followed up with Joyce Franco. She was with baby Torri on the NICU. She states that she is pumping regularly and obtaining increasing volumes of EBM. I verified 20 mls of EBM recently pump.  I stated to Joyce Franco that I have placed a referral to [facility], and she may call them to follow up and to obtain a symphony DEBP.  Joyce Franco also states that she does have a personal breast pump at home.   Objective Infant data: Mother's Current Feeding Choice: Breast Milk and Donor Milk   Maternal data: G1P0101  Vaginal, Spontaneous  Current breast feeding challenges:: NICU  Does the patient have breastfeeding experience prior to this delivery?: No  Pumping frequency: q2-3 hours during the day and q3-4 hours at night Pumped volume: 20 mL Flange Size: 24   WIC Program: Yes WIC Referral Sent?: Yes Pump: Personal (Pump in Style at home)  Assessment Infant: Feeding Status: NPO   Maternal: Milk volume: Normal  Intervention/Plan Interventions: Breast feeding basics reviewed; Education  Tools: Pump Pump Education: Setup, frequency, and cleaning  Plan: Consult Status: NICU follow-up  NICU Follow-up type: New admission follow up; Verify onset of copious milk; Verify absence of engorgement    Walker Shadow 11/02/2021, 12:47 PM

## 2021-11-03 ENCOUNTER — Ambulatory Visit: Payer: Medicaid Other

## 2021-11-03 MED ORDER — NIFEDIPINE ER OSMOTIC RELEASE 90 MG PO TB24: 90.0000 mg | ORAL_TABLET | Freq: Every day | ORAL | 3 refills | Status: DC

## 2021-11-03 MED ORDER — ACETAMINOPHEN 325 MG PO TABS: 650.0000 mg | ORAL_TABLET | Freq: Four times a day (QID) | ORAL | 0 refills | Status: DC | PRN

## 2021-11-03 MED ORDER — NIFEDIPINE ER OSMOTIC RELEASE 60 MG PO TB24
90.0000 mg | ORAL_TABLET | Freq: Every day | ORAL | Status: DC
  Administered 2021-11-03 – 2021-11-06 (×4): 90 mg via ORAL
  Filled 2021-11-03 (×4): qty 1

## 2021-11-03 NOTE — Progress Notes (Addendum)
Postpartum Note Day #3  S:  Patient doing well.  Pain controlled.  Tolerating regular diet.   Ambulating and voiding without difficulty. No headaches since delivery. Reports previously having seen occasional floaters. Baby girl doing well in NICU. Denies fevers, chills, chest pain, SOB, N/V, or worsening bilateral LE edema.  Lochia: Moderate Infant feeding:  Pumping (baby in NICU) Circumcision:  N/A, female infant Contraception:  None  O: Temp:  [98.3 F (36.8 C)-99 F (37.2 C)] 99 F (37.2 C) (07/03 0834) Pulse Rate:  [86-123] 116 (07/03 1236) Resp:  [16-19] 19 (07/03 1233) BP: (136-157)/(70-102) 147/96 (07/03 1236) SpO2:  [98 %-100 %] 100 % (07/03 0834) Gen: NAD, pleasant and cooperative, currently pumping Resp: No increased work of breathing Abdomen: soft, non-distended, non-tender throughout Uterus: firm, non-tender, below umbilicus Ext: No bilateral LE edema, no bilateral calf tenderness, SCDs on and working  Labs:  Recent Labs    10/31/21 2245 11/01/21 0405  HGB 12.1 10.5*  HCT 34.3* 29.7*    A/P: Patient is a 26 y.o. G1P0101 PPD#3 s/p SVD (IOL for preeclampsia with SF).  - Pain well controlled  - GU: UOP is adequate - GI: Tolerating regular diet - Activity: encouraged sitting up to chair and ambulation as tolerated - DVT Prophylaxis: SCDs, ambulation - Labs: as above  Disposition:  Possible D/C home tomorrow - awaiting improvement in blood pressures. Discussed possible need for adding another agent.  Steva Ready, DO [phone] (office)

## 2021-11-03 NOTE — Progress Notes (Signed)
In to perform postpartum rounds - patient about to shower. Vitals reviewed. Will return for full assessment.  Steva Ready, DO

## 2021-11-03 NOTE — Lactation Note (Signed)
This note was copied from a baby's chart.  NICU Lactation Consultation Note  Patient Name: Joyce Franco [id number]'F Date: 11/03/2021 Age:28 hours   Subjective Reason for consult: Follow-up assessment; Engorgement Mother's milk is coming in and she is pumping regularly. We reviewed s/s of engorgement and trt prn. We also discussed IDF-1. Mother requests assistance with lick and learn at 3pm feeding time. She will pre-pump.  Objective Infant data: Mother's Current Feeding Choice: Breast Milk  Maternal data: G1P0101  Vaginal, Spontaneous Current breast feeding challenges:: NICU  Pumping frequency: q3h Pumped volume: 60 mL  WIC Program: Yes WIC Referral Sent?: Yes Pump: Personal (Pump in Style at home)  Assessment Maternal: Milk volume: Normal   Intervention/Plan Interventions: Education; Ice; Visual merchandiser education  Tools: Pump Pump Education: Setup, frequency, and cleaning  Plan: Consult Status: NICU follow-up  NICU Follow-up type: Assist with IDF-1 (Mother to pre-pump before breastfeeding)  Mother to pump q3h [facility] will plan return visit at 3pm to assist with placement of infant at pre-pumped breast   Elder Negus 11/03/2021, 11:25 AM

## 2021-11-03 NOTE — Lactation Note (Signed)
This note was copied from a baby's chart. Lactation Consultation Note I returned to assist mother with placing infant at breast during gavage feeding. Mother pre-pumped. Infant did not wake but slept at breast. We reviewed normalcy of behavior and IDF expectations in the days/weeks to come.   Patient Name: Joyce Franco LSLHT'D Date: 11/03/2021   Age:28 hours   Elder Negus 11/03/2021, 3:50 PM

## 2021-11-03 NOTE — Progress Notes (Signed)
Call was made to patient. She has been in NICU visiting her baby. Blood pressures reviewed. Range over the last 24 hours have been 132-151/90s-102. Procardia XL 90mg  started today. Patient is concerned that her blood pressure elevates with activity/movement. Recommend additional evening for inpatient stay to allow further observation of normalization of her blood pressures prior to discharge home. Likely D/C home tomorrow. No additional BP agent for now but low threshold to add.  , DO

## 2021-11-04 ENCOUNTER — Inpatient Hospital Stay (HOSPITAL_COMMUNITY): Payer: Medicaid Other

## 2021-11-04 DIAGNOSIS — R072 Precordial pain: Secondary | ICD-10-CM | POA: Diagnosis not present

## 2021-11-04 DIAGNOSIS — R071 Chest pain on breathing: Secondary | ICD-10-CM

## 2021-11-04 DIAGNOSIS — O1413 Severe pre-eclampsia, third trimester: Secondary | ICD-10-CM

## 2021-11-04 DIAGNOSIS — I480 Paroxysmal atrial fibrillation: Secondary | ICD-10-CM

## 2021-11-04 LAB — URINALYSIS, ROUTINE W REFLEX MICROSCOPIC
Bilirubin Urine: NEGATIVE
Glucose, UA: NEGATIVE mg/dL
Ketones, ur: NEGATIVE mg/dL
Nitrite: NEGATIVE
Protein, ur: 30 mg/dL — AB
Specific Gravity, Urine: 1.029 (ref 1.005–1.030)
pH: 6 (ref 5.0–8.0)

## 2021-11-04 LAB — CBC WITH DIFFERENTIAL/PLATELET
Abs Immature Granulocytes: 0.15 10*3/uL — ABNORMAL HIGH (ref 0.00–0.07)
Basophils Absolute: 0 10*3/uL (ref 0.0–0.1)
Basophils Relative: 0 %
Eosinophils Absolute: 0.2 10*3/uL (ref 0.0–0.5)
Eosinophils Relative: 1 %
HCT: 32.1 % — ABNORMAL LOW (ref 36.0–46.0)
Hemoglobin: 11.1 g/dL — ABNORMAL LOW (ref 12.0–15.0)
Immature Granulocytes: 1 %
Lymphocytes Relative: 11 %
Lymphs Abs: 1.4 10*3/uL (ref 0.7–4.0)
MCH: 28.2 pg (ref 26.0–34.0)
MCHC: 34.6 g/dL (ref 30.0–36.0)
MCV: 81.7 fL (ref 80.0–100.0)
Monocytes Absolute: 0.7 10*3/uL (ref 0.1–1.0)
Monocytes Relative: 5 %
Neutro Abs: 10.1 10*3/uL — ABNORMAL HIGH (ref 1.7–7.7)
Neutrophils Relative %: 82 %
Platelets: 308 10*3/uL (ref 150–400)
RBC: 3.93 MIL/uL (ref 3.87–5.11)
RDW: 13.2 % (ref 11.5–15.5)
WBC: 12.5 10*3/uL — ABNORMAL HIGH (ref 4.0–10.5)
nRBC: 0 % (ref 0.0–0.2)

## 2021-11-04 LAB — ECHOCARDIOGRAM COMPLETE BUBBLE STUDY
AR max vel: 1.99 cm2
AV Area VTI: 2.01 cm2
AV Area mean vel: 2.01 cm2
AV Mean grad: 9 mmHg
AV Peak grad: 17.6 mmHg
Ao pk vel: 2.1 m/s
S' Lateral: 2.7 cm

## 2021-11-04 LAB — COMPREHENSIVE METABOLIC PANEL
ALT: 42 U/L (ref 0–44)
AST: 37 U/L (ref 15–41)
Albumin: 3.1 g/dL — ABNORMAL LOW (ref 3.5–5.0)
Alkaline Phosphatase: 121 U/L (ref 38–126)
Anion gap: 10 (ref 5–15)
BUN: 8 mg/dL (ref 6–20)
CO2: 23 mmol/L (ref 22–32)
Calcium: 8.8 mg/dL — ABNORMAL LOW (ref 8.9–10.3)
Chloride: 106 mmol/L (ref 98–111)
Creatinine, Ser: 0.66 mg/dL (ref 0.44–1.00)
GFR, Estimated: >60 mL/min (ref >60)
Glucose, Bld: 90 mg/dL (ref 70–99)
Potassium: 3.9 mmol/L (ref 3.5–5.1)
Sodium: 139 mmol/L (ref 135–145)
Total Bilirubin: 0.5 mg/dL (ref 0.3–1.2)
Total Protein: 6.8 g/dL (ref 6.5–8.1)

## 2021-11-04 LAB — TROPONIN I (HIGH SENSITIVITY)
Troponin I (High Sensitivity): 6 ng/L (ref <18)
Troponin I (High Sensitivity): 6 ng/L (ref <18)

## 2021-11-04 LAB — LACTATE DEHYDROGENASE: LDH: 200 U/L — ABNORMAL HIGH (ref 98–192)

## 2021-11-04 MED ORDER — DIPHENHYDRAMINE HCL 50 MG/ML IJ SOLN: 50.0000 mg | Freq: Once | INTRAMUSCULAR | Status: AC

## 2021-11-04 MED ORDER — DIPHENHYDRAMINE HCL 25 MG PO CAPS
50.0000 mg | ORAL_CAPSULE | Freq: Once | ORAL | Status: AC
  Administered 2021-11-05: 50 mg via ORAL
  Filled 2021-11-04: qty 2

## 2021-11-04 MED ORDER — DIPHENHYDRAMINE HCL 50 MG/ML IJ SOLN: 50.0000 mg | Freq: Once | INTRAMUSCULAR | Status: DC

## 2021-11-04 MED ORDER — DIPHENHYDRAMINE HCL 25 MG PO CAPS: 50.0000 mg | ORAL_CAPSULE | Freq: Once | ORAL | Status: DC

## 2021-11-04 MED ORDER — LABETALOL HCL 100 MG PO TABS
100.0000 mg | ORAL_TABLET | Freq: Two times a day (BID) | ORAL | Status: DC
  Administered 2021-11-04: 100 mg via ORAL
  Filled 2021-11-04: qty 1

## 2021-11-04 MED ORDER — SODIUM CHLORIDE 0.9 % IV SOLN
3.0000 g | Freq: Four times a day (QID) | INTRAVENOUS | Status: AC
  Administered 2021-11-04 – 2021-11-05 (×5): 3 g via INTRAVENOUS
  Filled 2021-11-04 (×5): qty 8

## 2021-11-04 MED ORDER — PREDNISONE 20 MG PO TABS
50.0000 mg | ORAL_TABLET | Freq: Four times a day (QID) | ORAL | Status: AC
  Administered 2021-11-04 – 2021-11-05 (×3): 50 mg via ORAL
  Filled 2021-11-04 (×3): qty 3

## 2021-11-04 MED ORDER — LABETALOL HCL 200 MG PO TABS
200.0000 mg | ORAL_TABLET | Freq: Two times a day (BID) | ORAL | Status: DC
  Administered 2021-11-04 – 2021-11-05 (×2): 200 mg via ORAL
  Filled 2021-11-04 (×2): qty 1

## 2021-11-04 NOTE — Lactation Note (Signed)
This note was copied from a baby's chart. Lactation Consultation Note  Patient Name: Joyce Franco [id number]'[id number] Date: 11/04/2021 Reason for consult: Follow-up assessment;Mother's request;Preterm <34wks;Engorgement Age:28 days  LC called by RN Maryruth Hancock, to examine breast. On arrival, Mom pumped off 60 ml both breasts. Breasts are soft with areas dependent swelling at base of breast and  plugged ducts in area of left breast outer quadrant.   Mom states comfortable fit with 24 flanges with no complaints of nipple pain. Milk moving easily with pumping from both breasts. RN to provided coconut oil, Mom do some gently massage towards nipple in midst of pumping to help move milk and relieve discomfort.  Breasts are not red and show no signs of wedge like rash. After pumping off milk, most parts of breasts were softer/  Mom is running a fever as per RN and is taking acetaminophen and started on antibiotics.  Mom to post pump on maintenance setting for 20 mins every 2-3 hrs. Mom to call for [facility] assistance if breast become hard and unable to move milk.  All questions answered at the end of the visit.  Mom aware not to use heat.  Mom to label and date milk for transport to NICU.   Maternal Data    Feeding Mother's Current Feeding Choice: Breast Milk  LATCH Score                    Lactation Tools Discussed/Used Tools: Pump;Flanges Flange Size: 24 Breast pump type: Double-Electric Breast Pump Pump Education: Setup, frequency, and cleaning;Milk Storage Reason for Pumping: releive discomfort Pumping frequency: post pump every 2 -3 hrs for 20 mins on maintenance setting.  Interventions    Discharge    Consult Status Consult Status: Follow-up Date: 11/05/21 Follow-up type: In-patient    Lowana Hable  Nicholson-Springer 11/04/2021, 7:20 PM

## 2021-11-04 NOTE — Lactation Note (Signed)
This note was copied from a baby's chart. Lactation Consultation Note  Patient Name: Joyce Franco LKGMW'N Date: 11/04/2021   Age:28 days Per RN Kandace Blitz), on [facility] via [place]. Mom is doing well tonight her engorgement is resolved and  mom is using DEBP as previously advised, mom doesn't need to be seen by [facility] services tonight.  Maternal Data    Feeding    LATCH Score                    Lactation Tools Discussed/Used    Interventions    Discharge    Consult Status      Danelle Earthly 11/04/2021, 11:39 PM

## 2021-11-04 NOTE — Progress Notes (Signed)
  Echocardiogram 2D Echocardiogram has been performed.  Roosvelt Maser F 11/04/2021, 6:04 PM

## 2021-11-04 NOTE — Progress Notes (Addendum)
Post Partum Day 4 Called by RN to evaluate patient for fever of 101.4 at 4.20 pm. Patient reports she has been feeling unwell most of the day, then felt the fever coming on. She continues with unchanged chest pain 4/10 intensity despite tylenol, protonix and flexeril use. Also with lower back pain bilaterally that started when the fever began. She denies nausea/vomiting.  She is ambulating and voiding without difficulty. Her abdominal pain is well controlled. Her bleeding is small amount.    Objective: Blood pressure (!) 147/85, pulse (!) 127, temperature 100.3 F (37.9 C), temperature source Axillary, resp. rate (!) 22, height 5\' 4"  (1.626 m), weight 85.1 kg, last menstrual period 03/09/2021, SpO2 100 %, unknown if currently breastfeeding.    11/04/2021    4:19 PM 11/04/2021    1:30 PM 11/04/2021   11:00 AM  Vitals with BMI  Systolic 147 144   Diastolic 85 90   Pulse 127 119 123     Physical Exam:  General: alert, cooperative, and mild distress CVS: s1, s2, tachycardic Pulmonary: Clear to auscultation bilaterally.  Lochia: appropriate Abdomen: Soft, uterine Fundus: Firm below umbilicus, tender on palpation DVT Evaluation: No evidence of DVT seen on physical exam. No significant calf/ankle edema. Back: With tenderness to palpation on bilateral lower back areas, no CVA tenderness bilaterally.   CBC    Component Value Date/Time   WBC 12.5 (H) 11/04/2021 1120   RBC 3.93 11/04/2021 1120   HGB 11.1 (L) 11/04/2021 1120   HCT 32.1 (L) 11/04/2021 1120   PLT 308 11/04/2021 1120   MCV 81.7 11/04/2021 1120   MCH 28.2 11/04/2021 1120   MCHC 34.6 11/04/2021 1120   RDW 13.2 11/04/2021 1120   LYMPHSABS 1.4 11/04/2021 1120   MONOABS 0.7 11/04/2021 1120   EOSABS 0.2 11/04/2021 1120   BASOSABS 0.0 11/04/2021 1120    CMP     Component Value Date/Time   NA 139 11/04/2021 1120   K 3.9 11/04/2021 1120   CL 106 11/04/2021 1120   CO2 23 11/04/2021 1120   GLUCOSE 90 11/04/2021 1120   BUN 8  11/04/2021 1120   CREATININE 0.66 11/04/2021 1120   CALCIUM 8.8 (L) 11/04/2021 1120   PROT 6.8 11/04/2021 1120   ALBUMIN 3.1 (L) 11/04/2021 1120   AST 37 11/04/2021 1120   ALT 42 11/04/2021 1120   ALKPHOS 121 11/04/2021 1120   BILITOT 0.5 11/04/2021 1120   GFRNONAA >60 11/04/2021 1120   GFRAA >60 11/18/2019 1823   Assessment/Plan: 26 y/o Para 1 postpartum day 4 after preterm delivery at 33 weeks, patient was induced for pre-eclampsia with severe features, with chest pain, and fever, Cardiovascular:  Preeclampsia with severe features:  - patient is now delivered and received postpartum magnesium sulfate for 24 hours.   -Continue with oral labetalol and procardia for blood pressure control. 2. Chest pain:     - S/p cardiology consultation, patient is for TTE and Chest CT Angiogram.             - EKG showed sinus tachycardia.  Troponin 1 was                 negative three times.    Infectious disease:  With fever, fundal tenderness and lower back pain. Will start antibiotics- IV unasyn for presumed postpartum endometritis. Check UA and urine culture.  Patient was GBS positive.   LOS: 5 days   Prescilla Sours, MD 11/04/2021, 5:06 PM   MD addendum: I asked RN  to examine patient's breasts and she reported that patient has bilateral breast engorgement but no redness over breasts. Will have lactational consultant evaluate the patient.  11/04/2021. 7:06 PM.

## 2021-11-04 NOTE — Lactation Note (Signed)
This note was copied from a baby's chart.  NICU Lactation Consultation Note  Patient Name: Joyce Franco [id number] Date: 11/04/2021 Age:28 days   Subjective Reason for consult: Follow-up assessment Mother continues to pump frequently and has improved breast comfort today. She continues to put baby sts at pre-pumped breast during some infant feedings for lick and learn practice.   Objective Infant data: Mother's Current Feeding Choice: Breast Milk  Infant feeding assessment Scale for Readiness: 1  Maternal data: G1P0101  Vaginal, Spontaneous   Pumping frequency: q3h Pumped volume: 120 mL   WIC Program: Yes WIC Referral Sent?: Yes Pump: Personal (Pump in Style at home)  Assessment  Maternal: Milk volume: Normal   Intervention/Plan Interventions: Education; Infant Driven Feeding Algorithm education  Tools: 71F feeding tube / Syringe  Plan: Consult Status: NICU follow-up  NICU Follow-up type: Assist with IDF-1 (Mother to pre-pump before breastfeeding); Weekly NICU follow up  Continue current poc.   Elder Negus 11/04/2021, 11:31 AM

## 2021-11-04 NOTE — Consult Note (Signed)
Cardiology Consultation:   Patient ID: Joyce Franco MRN: [id number]; DOB: [birth year]  Admit date: 10/30/2021 Date of Consult: 11/04/2021  PCP:  Oneita Hurt, No   [facility] Providers Cardiologist:  None    Patient Profile:   Joyce Franco is a 26 y.o. female with a hx of atrial fibrillation s/p TEE/DCCV in 10/2019, asthma, and endometriosis who is being seen 11/04/2021 for the evaluation of chest pain and HTN at the request of Dr. Sallye Ober.  History of Present Illness:   Joyce Franco is a 26 year old female with history of paroxysmal Afib diagnosed in 10/2019 in the setting of endometriosis. TSH was normal. TEE with LVEF 60-65%, normal RV, no significant valve disease. She ultimately underwent TEE/DCCV with successful return to NSR. She was last seen in follow-up in 10/2019 where she was doing well from a CV standpoint. She completed 1 month of apixaban but never followed up with Cardiology afterwards.  She presented on this admission with pre-eclampsia with severely elevated blood pressures.  She was induced at 33 weeks and delivered on 10/31/21 via vaginal delivery. Post-delivery course complicated by persistent hypertension and pleuritic chest pain for which Cardiology was consulted. Trop 6>6. ECG with sinus tachycardia with HR 123bpm. SBP 140-150/90s.   Currently, the patient reports continued pain with inspiration and shortness of breath. No lightheadedness, dizziness, LE edema, orthopnea or PND.    Past Medical History:  Diagnosis Date   Asthma    Atrial fibrillation (HCC) 2021   Endometriosis, uterus    Fractured pelvis (HCC)    Headache    Hyperemesis affecting pregnancy, antepartum    Medical history non-contributory    Urticaria     Past Surgical History:  Procedure Laterality Date   ABLATION ON ENDOMETRIOSIS  11/08/2014   Procedure: ABLATION ON ENDOMETRIOSIS;  Surgeon: Geryl Rankins, MD;  Location: [facility];  Service: Gynecology;;   CARDIOVERSION N/A 10/13/2019   Procedure:  CARDIOVERSION;  Surgeon: Chrystie Nose, MD;  Location: [facility] ENDOSCOPY;  Service: Cardiovascular;  Laterality: N/A;   CHROMOPERTUBATION  11/08/2014   Procedure: CHROMOPERTUBATION;  Surgeon: Geryl Rankins, MD;  Location: [facility];  Service: Gynecology;;   FOOT SURGERY     LAPAROSCOPY N/A 11/08/2014   Procedure: LAPAROSCOPY DIAGNOSTIC with peritoneal  biopsy;  Surgeon: Geryl Rankins, MD;  Location: [facility];  Service: Gynecology;  Laterality: N/A;   TEE WITHOUT CARDIOVERSION N/A 10/13/2019   Procedure: TRANSESOPHAGEAL ECHOCARDIOGRAM (TEE);  Surgeon: Chrystie Nose, MD;  Location: [facility] ENDOSCOPY;  Service: Cardiovascular;  Laterality: N/A;     Home Medications:  Prior to Admission medications   Medication Sig Start Date End Date Taking? Authorizing Provider  acetaminophen (TYLENOL) 325 MG tablet Take 650 mg by mouth every 6 (six) hours as needed for mild pain or moderate pain. Patient not taking: Reported on 10/27/2021    [provider]  acetaminophen (TYLENOL) 325 MG tablet Take 2 tablets (650 mg total) by mouth every 6 (six) hours as needed for moderate pain or mild pain (for pain scale < 4  OR  temperature  >/=  100.5 F). 11/03/21   Steva Ready, DO  cyclobenzaprine (FLEXERIL) 10 MG tablet Take 1 tablet (10 mg total) by mouth 2 (two) times daily as needed for muscle spasms. 10/12/21   Raelyn Mora, CNM  diphenhydrAMINE (BENADRYL) 25 MG tablet Take 25 mg by mouth every 6 (six) hours as needed for sleep. Patient not taking: Reported on 10/27/2021    [provider]  doxylamine, Sleep, Dartha Lodge)  25 MG tablet Take 25 mg by mouth at bedtime as needed.    [provider]  famotidine (PEPCID) 20 MG tablet Take 1 tablet (20 mg total) by mouth daily. Patient not taking: Reported on 10/27/2021 08/03/21 11/01/21  Steva Ready, DO  metoCLOPramide (REGLAN) 10 MG tablet Take 1 tablet (10 mg total) by mouth every 6 (six) hours as needed for nausea. 08/02/21   Steva Ready, DO  NIFEdipine  (PROCARDIA XL/NIFEDICAL-XL) 90 MG 24 hr tablet Take 1 tablet (90 mg total) by mouth daily. 11/04/21   Steva Ready, DO  ondansetron (ZOFRAN-ODT) 8 MG disintegrating tablet Take 1 tablet (8 mg total) by mouth every 8 (eight) hours as needed for nausea, vomiting or refractory nausea / vomiting. 08/02/21   Steva Ready, DO  Prenatal Vit-Fe Fumarate-FA (PRENATAL VITAMINS) 28-0.8 MG TABS Take 1 tablet by mouth daily. 04/13/21   Jacalyn Lefevre, MD  promethazine (PHENERGAN) 25 MG tablet Take 1 tablet (25 mg total) by mouth every 6 (six) hours as needed for nausea, vomiting or refractory nausea / vomiting. 08/02/21   Steva Ready, DO  scopolamine (TRANSDERM-SCOP) 1 MG/3DAYS Place 1 patch onto the skin every 3 (three) days.    [provider]  scopolamine (TRANSDERM-SCOP) 1 MG/3DAYS Place 1 patch (1.5 mg total) onto the skin every 3 (three) days. 08/02/21   Steva Ready, DO    Inpatient Medications: Scheduled Meds:  [START ON 11/05/2021] diphenhydrAMINE  50 mg Oral Once   Or   [START ON 11/05/2021] diphenhydrAMINE  50 mg Intravenous Once   labetalol  200 mg Oral BID   NIFEdipine  90 mg Oral Daily   pantoprazole  40 mg Oral Daily   predniSONE  50 mg Oral Q6H   prenatal multivitamin  1 tablet Oral Q1200   senna-docusate  2 tablet Oral Q24H   sucralfate  1 g Oral TID WC & HS   Tdap  0.5 mL Intramuscular Once   Continuous Infusions:  lactated ringers     lactated ringers Stopped (11/01/21 2157)   oxytocin Stopped (11/01/21 0130)   PRN Meds: acetaminophen, benzocaine-Menthol, butalbital-acetaminophen-caffeine, calcium carbonate, coconut oil, cyclobenzaprine, witch hazel-glycerin **AND** dibucaine, diphenhydrAMINE, fentaNYL (SUBLIMAZE) injection, hydrALAZINE **AND** hydrALAZINE **AND** labetalol **AND** labetalol **AND** Measure blood pressure, hydrOXYzine, lactated ringers, lidocaine (PF), metoCLOPramide, ondansetron, oxyCODONE-acetaminophen, oxyCODONE-acetaminophen, promethazine,  scopolamine, simethicone, sodium citrate-citric acid, terbutaline, zolpidem  Allergies:    Allergies  Allergen Reactions   Contrast Media [Iodinated Contrast Media] Hives and Other (See Comments)    Warm feeling in back of throat 10 minutes after contrast   Ibuprofen Nausea And Vomiting    Social History:   Social History   Socioeconomic History   Marital status: Single    Spouse name: Not on file   Number of children: Not on file   Years of education: Not on file   Highest education level: Not on file  Occupational History   Not on file  Tobacco Use   Smoking status: Former    Types: Cigars    Passive exposure: Yes   Smokeless tobacco: Never   Tobacco comments:    boyfriend smokes inside his home  Vaping Use   Vaping Use: Former   Substances: Nicotine, THC, CBD, Flavoring  Substance and Sexual Activity   Alcohol use: Not Currently    Comment: occ   Drug use: No   Sexual activity: Yes  Other Topics Concern   Not on file  Social History Narrative   Not on file   Social Determinants  of Health   Financial Resource Strain: Not on file  Food Insecurity: Not on file  Transportation Needs: Not on file  Physical Activity: Not on file  Stress: Not on file  Social Connections: Not on file  Intimate Partner Violence: Not on file    Family History:    Family History  Problem Relation Age of Onset   Healthy Mother    Healthy Father      ROS:  Please see the history of present illness.  Review of Systems  Constitutional:  Positive for malaise/fatigue.  HENT:  Negative for hearing loss.   Respiratory:  Positive for shortness of breath.   Cardiovascular:  Positive for chest pain. Negative for palpitations, orthopnea, claudication, leg swelling and PND.  Gastrointestinal:  Negative for nausea and vomiting.  Musculoskeletal:  Negative for falls.  Neurological:  Positive for headaches. Negative for dizziness and loss of consciousness.      Physical Exam/Data:    Vitals:   11/04/21 0340 11/04/21 1020 11/04/21 1100 11/04/21 1330  BP: 137/78 (!) 152/95  (!) 144/90  Pulse: 94  (!) 123 (!) 119  Resp: 16 19  20   Temp: 98 F (36.7 C) 98.8 F (37.1 C)  98 F (36.7 C)  TempSrc: Oral Oral  Axillary  SpO2: 99% 99%  98%  Weight:      Height:       No intake or output data in the 24 hours ending 11/04/21 1533    10/30/2021   11:48 AM 10/11/2021    8:14 PM 10/10/2021   11:37 AM  Last 3 Weights  Weight (lbs) 187 lb 9.6 oz 198 lb 12.8 oz 189 lb  Weight (kg) 85.095 kg 90.175 kg 85.73 kg     Body mass index is 32.2 kg/m.  General:  Well nourished, well developed, in no acute distress HEENT: normal Neck: no JVD Vascular: No carotid bruits; Distal pulses 2+ bilaterally Cardiac:  Tachycardic, regular, no murmurs Lungs:  clear to auscultation bilaterally, no wheezing, rhonchi or rales  Abd: soft, nontender, no hepatomegaly  Ext: no edema Musculoskeletal:  No deformities, BUE and BLE strength normal and equal Skin: warm and dry  Neuro:  CNs 2-12 intact, no focal abnormalities noted Psych:  Normal affect   EKG:  The EKG was personally reviewed and demonstrates:  Sinus tachycardia Telemetry:  Telemetry was personally reviewed and demonstrates:  Not on telemetry  Relevant CV Studies: TEE-CV 10/13/19 1. Left ventricular ejection fraction, by estimation, is 60 to 65%. The  left ventricle has normal function. The left ventricle has no regional  wall motion abnormalities.   2. Right ventricular systolic function is normal. The right ventricular  size is normal.   3. No left atrial/left atrial appendage thrombus was detected.   4. The mitral valve is grossly normal. Trivial mitral valve  regurgitation.   5. The aortic valve is tricuspid. Aortic valve regurgitation is not  visualized.   Conclusion(s)/Recommendation(s): No LA/LAA thrombus identified. Successful  cardioversion performed with restoration of normal sinus rhythm.     Laboratory  Data:  High Sensitivity Troponin:   Recent Labs  Lab 11/04/21 1120 11/04/21 1323  TROPONINIHS 6 6     Chemistry Recent Labs  Lab 10/31/21 1324 11/01/21 0405 11/04/21 1120  NA 135 134* 139  K 3.6 4.0 3.9  CL 104 107 106  CO2 22 23 23   GLUCOSE 127* 83 90  BUN 5* <5* 8  CREATININE 0.64 0.66 0.66  CALCIUM 7.1* 6.9* 8.8*  GFRNONAA >60 >60 >60  ANIONGAP 9 4* 10    Recent Labs  Lab 10/31/21 1324 11/01/21 0405 11/04/21 1120  PROT 5.7* 5.4* 6.8  ALBUMIN 2.7* 2.5* 3.1*  AST 21 18 37  ALT 16 14 42  ALKPHOS 166* 139* 121  BILITOT 0.3 0.2* 0.5   Lipids No results for input(s): "CHOL", "TRIG", "HDL", "LABVLDL", "LDLCALC", "CHOLHDL" in the last 168 hours.  Hematology Recent Labs  Lab 10/31/21 2245 11/01/21 0405 11/04/21 1120  WBC 15.2* 20.1* 12.5*  RBC 4.24 3.70* 3.93  HGB 12.1 10.5* 11.1*  HCT 34.3* 29.7* 32.1*  MCV 80.9 80.3 81.7  MCH 28.5 28.4 28.2  MCHC 35.3 35.4 34.6  RDW 13.2 13.1 13.2  PLT 302 284 308   Thyroid No results for input(s): "TSH", "FREET4" in the last 168 hours.  BNPNo results for input(s): "BNP", "PROBNP" in the last 168 hours.  DDimer No results for input(s): "DDIMER" in the last 168 hours.   Radiology/Studies:  No results found.   Assessment and Plan:   #Pleuritic Chest Pain: #SOB: #Sinus Tachycardia: Patient reports sudden onset pleuritic chest pain and SOB while visiting her baby in the NICU. Trop negative and flat at 6. ECG with sinus tachycardia. She is not currently hypoxic and appears comfortable on exam. TTE pending. Given symptoms and hypercoagulability in the post-partum period, would recommend obtaining CTA to ensure no evidence of PE. Patient has a contrast allergy and therefore will be pre-medicated prior to the study. -Obtain CTA PE protocol  -Follow-up TTE -Manage HTN as below -Will need pre-treatment with prednisone and benadryl prior to CTA  #Pre-Eclampsia: #Gestational HTN: #Persistent HTN in Post-Partum  Period: Patient diagnosed with gestational HTN during her pregnancy which progressed to severe pre-eclampsia requiring induction at 33 weeks. BP has been persistently elevated in the post-partum period with associated HA. Also having pleuritic chest pain and SOB that is being worked up as above. Will continue procardia and labetalol at this time and up-titrate as needed. -Continue nifedipine 90mg  daily -Continue labetalol 200mg  BID; can up-titrate as tolerated  #History of Paroxysmal Afib:  Presented in 10/2019 with new onset Afib with RVR ultimately receiving TEE/DCCV at that time. Completed 1 month of apixaban. TEE with normal BiV function and no significant valve disease. Unclear trigger of Afib and patient was lost to follow-up. Likely merits further work-up as outpatient. -Will need outpatient follow-up   Risk Assessment/Risk Scores:  [id number]}      CHA2DS2-VASc Score = 2  This indicates a 2.2% annual risk of stroke. The patient's score is based upon: CHF History: 0 HTN History: 1 Diabetes History: 0 Stroke History: 0 Vascular Disease History: 0 Age Score: 0 Gender Score: 1          For questions or updates, please contact [facility] Please consult [email or web address] for contact info under    Signed, , MD  11/04/2021 3:33 PM

## 2021-11-04 NOTE — Progress Notes (Addendum)
Post Partum Day 4  I was called by RN to evaluate patient for complaints of chest pain.  As per patient chest pain began about 3 hours ago after she arrived at the NICU to see her baby. Pain is in mid chest area radiates to her back. 4/10 intensity. Worsens when she takes a deep breath in.  Also with mild shortness of breath. She also has a headache since yesterday night, 4/10 intensity. RN has given her tylenol and protonix this AM which have not helped the symptoms. She denies fevers/chills/cough/nausea and vomiting. She is able to ambulate well. She is tolerating regular diet. Her bleeding is normal. Her abdominal pain is well controlled. Her baby is stable in the NICU.   Objective: Blood pressure (!) 152/95, pulse (!) 123, temperature 98.8 F (37.1 C), temperature source Oral, resp. rate 19, height 5\' 4"  (1.626 m), weight 85.1 kg, last menstrual period 03/09/2021, SpO2 99 %, unknown if currently breastfeeding.  Physical Exam:  General: alert, cooperative, and mild distress CVS: s1, s2, Tachycardic, pulse 120, regular rhythm.  Tender to palpation on mid sternum area.  Pulmonary:  Clear to auscultation bilaterally.   Abdomen: soft, non tender, non distended, uterine fundus firm below umbilicus. Lochia: appropriate Extremities: DVT Evaluation: No evidence of DVT seen on physical exam.  No cords or calf tenderness. No significant calf/ankle edema. 1+ patellar reflex bilaterally.  Assessment/Plan: 26 y/o Para 1 postpartum day 4 after preterm delivery at 33 weeks, patient was induced for pre-eclampsia with severe features, with elevated blood pressures and on procardia, and now with chest pain, with possible cardiac origin, - Oral labetalol started. - Check preeclampsia labs, troponin 1, EKG and cardiac echo. - Cardiology was consulted Dr. 34 and he recommended starting oral labetalol on patient and he will be by to see patient later in the day. Cardiology consultation appreciated.   LOS:  5 days  Rosemary Holms, MD 11/04/2021, 11:27 AM

## 2021-11-05 ENCOUNTER — Inpatient Hospital Stay (HOSPITAL_COMMUNITY): Payer: Medicaid Other

## 2021-11-05 ENCOUNTER — Other Ambulatory Visit: Payer: Medicaid Other

## 2021-11-05 ENCOUNTER — Ambulatory Visit: Payer: Medicaid Other

## 2021-11-05 DIAGNOSIS — O1413 Severe pre-eclampsia, third trimester: Secondary | ICD-10-CM

## 2021-11-05 LAB — CBC WITH DIFFERENTIAL/PLATELET
Abs Immature Granulocytes: 0.11 10*3/uL — ABNORMAL HIGH (ref 0.00–0.07)
Basophils Absolute: 0 10*3/uL (ref 0.0–0.1)
Basophils Relative: 0 %
Eosinophils Absolute: 0 10*3/uL (ref 0.0–0.5)
Eosinophils Relative: 0 %
HCT: 29.8 % — ABNORMAL LOW (ref 36.0–46.0)
Hemoglobin: 10.4 g/dL — ABNORMAL LOW (ref 12.0–15.0)
Immature Granulocytes: 1 %
Lymphocytes Relative: 10 %
Lymphs Abs: 1.1 10*3/uL (ref 0.7–4.0)
MCH: 28.5 pg (ref 26.0–34.0)
MCHC: 34.9 g/dL (ref 30.0–36.0)
MCV: 81.6 fL (ref 80.0–100.0)
Monocytes Absolute: 0.1 10*3/uL (ref 0.1–1.0)
Monocytes Relative: 1 %
Neutro Abs: 9 10*3/uL — ABNORMAL HIGH (ref 1.7–7.7)
Neutrophils Relative %: 88 %
Platelets: 310 10*3/uL (ref 150–400)
RBC: 3.65 MIL/uL — ABNORMAL LOW (ref 3.87–5.11)
RDW: 13.7 % (ref 11.5–15.5)
WBC: 10.3 10*3/uL (ref 4.0–10.5)
nRBC: 0 % (ref 0.0–0.2)

## 2021-11-05 LAB — SURGICAL PATHOLOGY

## 2021-11-05 MED ORDER — IOHEXOL 350 MG/ML SOLN
100.0000 mL | Freq: Once | INTRAVENOUS | Status: AC | PRN
  Administered 2021-11-05: 100 mL via INTRAVENOUS

## 2021-11-05 MED ORDER — LABETALOL HCL 100 MG PO TABS
100.0000 mg | ORAL_TABLET | Freq: Once | ORAL | Status: AC
Start: 2021-11-05 — End: 2021-11-05
  Administered 2021-11-05: 100 mg via ORAL
  Filled 2021-11-05: qty 1

## 2021-11-05 MED ORDER — LABETALOL HCL 200 MG PO TABS
300.0000 mg | ORAL_TABLET | Freq: Two times a day (BID) | ORAL | Status: DC
  Administered 2021-11-05 – 2021-11-06 (×2): 300 mg via ORAL
  Filled 2021-11-05 (×2): qty 1

## 2021-11-05 MED ORDER — LABETALOL HCL 300 MG PO TABS: 300.0000 mg | ORAL_TABLET | Freq: Two times a day (BID) | ORAL | 2 refills | Status: DC

## 2021-11-05 NOTE — Lactation Note (Signed)
This note was copied from a baby's chart.  NICU Lactation Consultation Note  Patient Name: Joyce Franco [id number] Date: 11/05/2021 Age:28 days  Subjective Reason for consult: Follow-up assessment; NICU baby; Infant < 6lbs; Late-preterm 34-36.6wks; Primapara; 1st time breastfeeding  Visited with mom of 51 days old LPI (adjusted) NICU female, she's a P1 and reports the full onset of lactogenesis II. Joyce Franco also voiced that the engorgement episode is gone, it has got better and that she just needs to keep up with the pumping to avoid getting milk "build up". Joyce Franco has been working on IDF 1 with baby Joyce Franco, she's aware that she needs to empty the breasts first prior doing "lick and learn". She also requested information regarding the milk bank, she plans to donate some of her breastmilk; she said "I know my baby got donor milk at first and I'd like to give some back to the milk bank". Provided a flyer with [facility] mother's milk bank contact information. Reviewed discharge education (she's expecting to be D/C tomorrow), lactogenesis II/III, pumping schedule and anticipatory guidelines.  Objective Infant data: Mother's Current Feeding Choice: Breast Milk  Infant feeding assessment Scale for Readiness: 3  Maternal data: G1P0101  Vaginal, Spontaneous Pumping frequency: 4 times/24 hours Pumped volume: 180 mL Flange Size: 24 WIC Program: Yes WIC Referral Sent?: Yes Pump: Personal (Pump in Style at home)  Assessment Infant: In NICU  Maternal: Milk volume: Normal  Intervention/Plan Interventions: Breast feeding basics reviewed; DEBP; Education; Infant Driven Feeding Algorithm education Tools: Pump; Flanges Pump Education: Setup, frequency, and cleaning; Milk Storage  Plan of care: Encouraged mom to try pumping consistently every 3 hours, at least 8 pumping sessions/24 hours She'll apply ice as needed  Female visitor present. All questions and concerns answered, family to  contact [facility] services PRN.  Consult Status: NICU follow-up NICU Follow-up type: Maternal D/C visit; Verify absence of engorgement; Assist with IDF-1 (Mother to pre-pump before breastfeeding)   Joyce Franco 11/05/2021, 11:17 AM

## 2021-11-05 NOTE — Progress Notes (Addendum)
Postpartum Note Day #5  S:  Patient is feeling okay. Reports mid-chest tightness as her symptoms of chest pain. Does not feel anxious. Has adequate familial support and feels good about her daughter in the NICU with all of her support there. Reports pelvic cramping/pain. States yesterday she started having heavier bleeding with clots. No foul smelling lochia. Lactation came to see her yesterday due to breast engorgement bilaterally.  Denies fevers, chills, chest pain, visual changes, SOB, RUQ/epigastric pain, N/V, dysuria, hematuria, or sudden onset/worsening bilateral LE or facial edema.  Lochia: Moderate Infant feeding:  Pumping (baby in NICU) Circumcision:  N/A, female infant Contraception:  None  O: Temp:  [98 F (36.7 C)-101.4 F (38.6 C)] 98.3 F (36.8 C) (07/05 0300) Pulse Rate:  [102-127] 102 (07/05 0010) Resp:  [16-22] 16 (07/05 0300) BP: (128-152)/(64-95) 129/74 (07/05 0300) SpO2:  [97 %-100 %] 99 % (07/05 0300) Gen: NAD, pleasant and cooperative, currently pumping Cardio: RRR Resp: CTAB, no wheezes/rales/rhonchi Abdomen: soft, non-distended, fundal tenderness present Uterus: firm, non-tender, below umbilicus, fundal tenderness present Ext: No bilateral LE edema, no bilateral calf tenderness  Labs:  Recent Labs    11/04/21 1120 11/05/21 0507  HGB 11.1* 10.4*  HCT 32.1* 29.8*    A/P: Patient is a 26 y.o. G1P0101 PPD#5 s/p SVD (IOL for preeclampsia with SF). Postpartum course complicated by chest pain and presumed postpartum endometritis.  S/p SVD - Pain well controlled  - GU: UOP is adequate - GI: Tolerating regular diet - Activity: encouraged sitting up to chair and ambulation as tolerated - DVT Prophylaxis: SCDs, ambulation - Labs: as above  Preeclampsia with severe features - S/p 24 hours of magnesium sulfate postpartum - Medication: Procardia XL 90mg  daily, Labetalol 200mg  BID (started on 7/4) - Blood pressures normal to mild range, last BP 129/74 -  Preeclampsia labs unremarkable, PCR 0.15 (6/29)  Hx paroxysmal A. Fib with current pleuritic chest pain and tachycardia - Cardiology following - appreciate recommendations - Labetalol started 200mg  BID started on 7/4 - EKG on 7/4 revealed sinus tachycardia - Troponin x 3 negative - Previous Echo in 2021 unremarkable, Echo on 7/4 unremarkable with EF 60-65% - CTA Chest ordered for this AM to rule out PE - Will need outpatient follow-up with Cardiology  Postpartum fever - Differentials include presumed postpartum endometritis due to fundal tenderness vs breast engorgement vs combination of both vs UTI vs unlikely URI given absence of symptoms - Tmax/last: 101.4 Farenheit on 7/4 at 1619  - Antibiotics: Unasyn 3g q6h ordered x 24 hours - last dose at 1800 tonight - Imaging: TVUS ordered today to rule out retained products of conception  Disposition: D/C home possible tomorrow to await afebrile status at least 24 hour after completion of antibiotics and pending completion of Cardiology evaluation.  2022, DO [phone] (office)

## 2021-11-05 NOTE — Progress Notes (Signed)
Patient has been out of room X2.  Has been in NICU (very reasonably) Chart reviewed:  Briefly 26 yo Severe Pre-eclampsia, post partum fever, and hx of PAF last in sinus (sinus tachycardia)    Has noted some pleuritic chest pain with other providers.  Vitals:   11/05/21 0300 11/05/21 0903  BP: 129/74 (!) 156/93  Pulse:  (!) 104  Resp: 16 16  Temp: 98.3 F (36.8 C) 98.2 F (36.8 C)  SpO2: 99% 100%   Tele: NA Personally reviewed relevant tests;  Normal LV Size, no vascular calcium no PE Echo normal LV function, normal RV function normal atrial size  Would recommend  - Continue nifedipine - I have increased labetalol 300 mg PO BID - will attempt to see patient early 11/06/21 for potential discharge  Riley Lam, MD FASE Cardiologist [facility]  [address] [place], [address] [place], [place] [address] [phone]  12:06 PM

## 2021-11-05 NOTE — Progress Notes (Signed)
Call was made to patient to discuss imaging results and last BP. Patient just completed visit in the NICU and returning to [facility] for next antibiotic dose. CTA Chest was negative for PE; however, incidental 2.1cm right thyroid nodule was noted which will require outpatient US/follow-up for further evaluation. Pelvis US (transabdominal only performed due to patient's pain) - overall unremarkable - endometrial canal measures 69mm containing heterogenous material - questionable blood, unable to rule out retain products. I spoke with Dr. Tyron Russell (radiologist) who states it appears most consistent with blood in the endometrium. All findings were discussed with the patient in full detail.  Last BP was 156/93 - patient states she was not active at the time. Recommend increase in Labetalol to 300mg  BID which has already been changed by Cardiology. Continue Procardia  XL 30mg  at this time. Recommend continued inpatient management until at least 24 hours afebrile from postpartum endometritis and while optimizing blood pressures.  , DO

## 2021-11-05 NOTE — Progress Notes (Signed)
CSW followed up with MOB at bedside to offer support and assess for needs, concerns, and resources; CSW introduced self and explained role. MOB was sitting in recliner and holding, accompanied by a female guest. CSW inquired about how MOB was doing, MOB reported that she was doing good and denied any postpartum depression signs/symptoms. MOB shared about medical complications and how she is ready for discharge. CSW actively listened and provided well wishes. MOB reported that she feels well informed about infant's care. CSW inquired about any needs/concerns, MOB reported that meal vouchers would be helpful. CSW agreed to provide meal vouchers after MOB is discharged. CSW provided MOB with CSW contact information and encouraged MOB to contact CSW if any needs/concerns arise.    CSW will continue to offer support and resources to family while infant remains in NICU.    Celso Sickle, LCSW Clinical Social Worker [facility] Cell#: [phone]

## 2021-11-06 ENCOUNTER — Ambulatory Visit: Payer: Medicaid Other

## 2021-11-06 DIAGNOSIS — O1493 Unspecified pre-eclampsia, third trimester: Secondary | ICD-10-CM

## 2021-11-06 LAB — CULTURE, OB URINE: Culture: 80000 — AB

## 2021-11-06 NOTE — Discharge Summary (Signed)
Postpartum Discharge Summary  Date of Service updated 11/06/2021     Patient Name: Joyce Franco DOB: 1995/07/27 MRN: [id number]  Date of admission: 10/30/2021 Delivery date:10/31/2021  Delivering provider: Burman Foster B  Date of discharge: 11/06/2021  Admitting diagnosis: Preeclampsia, third trimester [O14.93] Intrauterine pregnancy: [redacted]w[redacted]d    Secondary diagnosis:  Principal Problem:   Preeclampsia, severe Active Problems:   Preeclampsia, third trimester  Additional problems: None    Discharge diagnosis: Preterm Pregnancy Delivered, Preeclampsia (severe), and postpartum endometritis                                                Post partum procedures: Antibiotics  Augmentation: AROM, Pitocin, and Cytotec Complications: None  Hospital course: Induction of Labor With Vaginal Delivery   26y.o. yo G1P0101 at 328w5das admitted to the hospital 10/30/2021 for induction of labor.  Indication for induction: Preeclampsia.  Patient had an uncomplicated labor course as follows: Membrane Rupture Time/Date: 8:57 PM ,10/31/2021   Delivery Method:Vaginal, Spontaneous  Episiotomy: None  Lacerations:  None  Details of delivery can be found in separate delivery note.  Patient had a routine postpartum course. Patient is discharged home 11/06/21.  Newborn Data: Birth date:10/31/2021  Birth time:9:26 PM  Gender:Female  Living status:Living  Apgars:8 ,9  Weight:1530 g   Magnesium Sulfate received: Yes: Seizure prophylaxis BMZ received: Yes Rhophylac:N/A MMR:N/A T-DaP: unknown Flu: N/A Transfusion:No  Physical exam  Vitals:   11/05/21 2213 11/05/21 2320 11/06/21 0504 11/06/21 0738  BP: 123/70 (!) 114/48 126/69 (!) 141/88  Pulse: (!) 103 98 92 91  Resp: _0 Temp:  98 F (36.7 C) 97.7 F (36.5 C) 98.2 F (36.8 C)  TempSrc:  Oral Oral Oral  SpO2: 99% 100% 100% 98%  Weight:      Height:       General: alert, cooperative, and no distress Lochia: appropriate Uterine  Fundus: firm Incision: N/A DVT Evaluation: No evidence of DVT seen on physical exam. Labs: Lab Results  Component Value Date   WBC 10.3 11/05/2021   HGB 10.4 (L) 11/05/2021   HCT 29.8 (L) 11/05/2021   MCV 81.6 11/05/2021   PLT 310 11/05/2021      Latest Ref Rng & Units 11/04/2021   11:20 AM  CMP  Glucose 70 - 99 mg/dL 90   BUN 6 - 20 mg/dL 8   Creatinine 0.44 - 1.00 mg/dL 0.66   Sodium 135 - 145 mmol/L 139   Potassium 3.5 - 5.1 mmol/L 3.9   Chloride 98 - 111 mmol/L 106   CO2 22 - 32 mmol/L 23   Calcium 8.9 - 10.3 mg/dL 8.8   Total Protein 6.5 - 8.1 g/dL 6.8   Total Bilirubin 0.3 - 1.2 mg/dL 0.5   Alkaline Phos 38 - 126 U/L 121   AST 15 - 41 U/L 37   ALT 0 - 44 U/L 42    Edinburgh Score:     No data to display            After visit meds:  Allergies as of 11/06/2021       Reactions   Contrast Media [iodinated Contrast Media] Hives, Other (See Comments)   Warm feeling in back of throat 10 minutes after contrast   Ibuprofen Nausea And Vomiting  Medication List     STOP taking these medications    cyclobenzaprine 10 MG tablet Commonly known as: FLEXERIL   diphenhydrAMINE 25 MG tablet Commonly known as: BENADRYL   doxylamine (Sleep) 25 MG tablet Commonly known as: UNISOM   famotidine 20 MG tablet Commonly known as: PEPCID   metoCLOPramide 10 MG tablet Commonly known as: REGLAN   nitrofurantoin (macrocrystal-monohydrate) 100 MG capsule Commonly known as: MACROBID   ondansetron 8 MG disintegrating tablet Commonly known as: ZOFRAN-ODT   promethazine 25 MG tablet Commonly known as: PHENERGAN   scopolamine 1 MG/3DAYS Commonly known as: TRANSDERM-SCOP       TAKE these medications    acetaminophen 325 MG tablet Commonly known as: TYLENOL Take 2 tablets (650 mg total) by mouth every 6 (six) hours as needed for moderate pain or mild pain (for pain scale < 4  OR  temperature  >/=  100.5 F). What changed: reasons to take this   labetalol  300 MG tablet Commonly known as: NORMODYNE Take 1 tablet (300 mg total) by mouth 2 (two) times daily.   NIFEdipine 90 MG 24 hr tablet Commonly known as: PROCARDIA XL/NIFEDICAL-XL Take 1 tablet (90 mg total) by mouth daily.   Prenatal Vitamins 28-0.8 MG Tabs Take 1 tablet by mouth daily.         Discharge home in stable condition Infant Feeding: Breast Infant Disposition:NICU Discharge instruction: per After Visit Summary and Postpartum booklet. Activity: Advance as tolerated. Pelvic rest for 6 weeks.  Diet: low salt diet Anticipated Birth Control: Unsure Postpartum Appointment:2-3 days Additional Postpartum F/U: BP check 2-3 days Future Appointments:No future appointments. Follow up Visit:  Follow-up Information     Drema Dallas, DO Follow up.   Specialty: Obstetrics and Gynecology Why: Our office will call to arrange your blood pressure check later this week as well as your 6 week postpartum visit. Contact information: [address] [place] [address] [phone]                     11/06/2021 Christophe Louis, MD

## 2021-11-06 NOTE — Progress Notes (Signed)
Pt discharged after discharge instructions given. Pt verbalized understanding and all questions answered. IV discontinued. Pt discharged in stable condition with all belongings.

## 2021-11-06 NOTE — Progress Notes (Signed)
Progress Note  Patient Name: Joyce Franco Date of Encounter: 11/06/2021  Primary Cardiologist: None   Subjective   Overnight blood pressures has improved. Patient notes that she had a bit of chest and abdominal pain that has resolved at time of evaluation. When asking about her AM elevated BP she has not taken her AM dose today.  Inpatient Medications    Scheduled Meds:  labetalol  300 mg Oral BID   NIFEdipine  90 mg Oral Daily   pantoprazole  40 mg Oral Daily   prenatal multivitamin  1 tablet Oral Q1200   senna-docusate  2 tablet Oral Q24H   sucralfate  1 g Oral TID WC & HS   Tdap  0.5 mL Intramuscular Once   Continuous Infusions:  lactated ringers     lactated ringers Stopped (11/01/21 2157)   oxytocin Stopped (11/01/21 0130)   PRN Meds: acetaminophen, benzocaine-Menthol, butalbital-acetaminophen-caffeine, calcium carbonate, coconut oil, cyclobenzaprine, witch hazel-glycerin **AND** dibucaine, diphenhydrAMINE, fentaNYL (SUBLIMAZE) injection, hydrALAZINE **AND** hydrALAZINE **AND** labetalol **AND** labetalol **AND** Measure blood pressure, hydrOXYzine, lactated ringers, lidocaine (PF), metoCLOPramide, ondansetron, oxyCODONE-acetaminophen, oxyCODONE-acetaminophen, promethazine, scopolamine, simethicone, sodium citrate-citric acid, terbutaline, zolpidem   Vital Signs    Vitals:   11/05/21 2213 11/05/21 2320 11/06/21 0504 11/06/21 0738  BP: 123/70 (!) 114/48 126/69 (!) 141/88  Pulse: (!) 103 98 92 91  Resp: 16 16 18 16   Temp:  98 F (36.7 C) 97.7 F (36.5 C) 98.2 F (36.8 C)  TempSrc:  Oral Oral Oral  SpO2: 99% 100% 100% 98%  Weight:      Height:        Intake/Output Summary (Last 24 hours) at 11/06/2021 0851 Last data filed at 11/06/2021 01/07/2022 Gross per 24 hour  Intake 360 ml  Output --  Net 360 ml   Filed Weights   10/30/21 1148  Weight: 85.1 kg    Telemetry    NA - Personally Reviewed  Physical Exam   Gen: no distress   Neck: No JVD Ears: No  11/01/21 Sign Cardiac: No Rubs or Gallops, RRR no murmur + radial pulses Respiratory: Clear to auscultation bilaterally, normal effort, normal  respiratory rate GI: Soft, nontender, non-distended  MS: Non pitting  edema;  moves all extremities Integument: Skin feels warm Neuro:  At time of evaluation, alert and oriented to person/place/time/situation  Psych: Normal affect, patient feels well    Labs    Chemistry Recent Labs  Lab 10/31/21 1324 11/01/21 0405 11/04/21 1120  NA 135 134* 139  K 3.6 4.0 3.9  CL 104 107 106  CO2 22 23 23   GLUCOSE 127* 83 90  BUN 5* <5* 8  CREATININE 0.64 0.66 0.66  CALCIUM 7.1* 6.9* 8.8*  PROT 5.7* 5.4* 6.8  ALBUMIN 2.7* 2.5* 3.1*  AST 21 18 37  ALT 16 14 42  ALKPHOS 166* 139* 121  BILITOT 0.3 0.2* 0.5  GFRNONAA >60 >60 >60  ANIONGAP 9 4* 10     Hematology Recent Labs  Lab 11/01/21 0405 11/04/21 1120 11/05/21 0507  WBC 20.1* 12.5* 10.3  RBC 3.70* 3.93 3.65*  HGB 10.5* 11.1* 10.4*  HCT 29.7* 32.1* 29.8*  MCV 80.3 81.7 81.6  MCH 28.4 28.2 28.5  MCHC 35.4 34.6 34.9  RDW 13.1 13.2 13.7  PLT 284 308 310    Cardiac EnzymesNo results for input(s): "TROPONINI" in the last 168 hours. No results for input(s): "TROPIPOC" in the last 168 hours.   BNPNo results for input(s): "BNP", "PROBNP" in the  last 168 hours.   DDimer No results for input(s): "DDIMER" in the last 168 hours.   Radiology    US PELVIS (TRANSABDOMINAL ONLY)  Result Date: 11/05/2021 CLINICAL DATA:  Five days postpartum, fever and heavy bleeding for 1 day EXAM: TRANSABDOMINAL ULTRASOUND OF PELVIS TECHNIQUE: Transabdominal ultrasound examination of the pelvis was performed including evaluation of the uterus, ovaries, adnexal regions, and pelvic cul-de-sac. COMPARISON:  None FINDINGS: Uterus Measurements: 11.1 x 7.7 x 10.7 cm = volume: 483 mL. Anteverted. Normal morphology without mass Endometrium Thickness: Distended, 27 mm thick. Heterogeneous appearance. This could represent  blood within the endometrial canal, unable to exclude retained products of conception. Right ovary Not visualized, likely obscured by bowel Left ovary Not visualized, likely obscured by bowel Other findings: No rib pelvic fluid or adnexal masses. Examination limited due to patient pain IMPRESSION: Nonvisualization of ovaries. Distended endometrial canal 27 mm thick containing heterogeneous material, question blood within the endometrial canal, unable to exclude retained products of conception. Electronically Signed   By: Ulyses Southward M.D.   On: 11/05/2021 09:40   CT Angio Chest Pulmonary Embolism (PE) W or WO Contrast  Result Date: 11/05/2021 CLINICAL DATA:  Severe preeclampsia EXAM: CT ANGIOGRAPHY CHEST WITH CONTRAST TECHNIQUE: Multidetector CT imaging of the chest was performed using the standard protocol during bolus administration of intravenous contrast. Multiplanar CT image reconstructions and MIPs were obtained to evaluate the vascular anatomy. RADIATION DOSE REDUCTION: This exam was performed according to the departmental dose-optimization program which includes automated exposure control, adjustment of the mA and/or kV according to patient size and/or use of iterative reconstruction technique. CONTRAST:  OMNIPAQUE IOHEXOL 350 MG/ML SOLN COMPARISON:  None Available. FINDINGS: Cardiovascular: No evidence of pulmonary embolus. Normal heart size. No pericardial effusion. Normal caliber thoracic aorta with no atherosclerotic disease. Mediastinum/Nodes: Esophagus is unremarkable. Right thyroid nodule measuring 2.1 cm. No pathologically enlarged lymph nodes seen in the chest. Lungs/Pleura: Central airways are patent. No consolidation, pleural effusion or pneumothorax. Upper Abdomen: No acute abnormality. Musculoskeletal: No chest wall abnormality. No acute or significant osseous findings. Review of the MIP images confirms the above findings. IMPRESSION: 1. No evidence of acute pulmonary embolus or acute  airspace opacity. 2. Right thyroid nodule measuring 2.1 cm. Recommend further evaluation with thyroid ultrasound which can be performed non emergently. Reference: J Am Coll Radiol. 2015 Feb;12(2): 143-50 Electronically Signed   By: Allegra Lai M.D.   On: 11/05/2021 08:49   ECHOCARDIOGRAM COMPLETE BUBBLE STUDY  Result Date: 11/04/2021    ECHOCARDIOGRAM REPORT   Patient Name:   DIMPLE BASTYR Date of Exam: 11/04/2021 Medical Rec #:  [id number]     Height:       64.0 in Accession #:    [id number]    Weight:       187.6 lb Date of Birth:  23-Jan-1996     BSA:          1.904 m Patient Age:    25 years      BP:           147/85 mmHg Patient Gender: F             HR:           119 bpm. Exam Location:  Inpatient Procedure: 2D Echo, Cardiac Doppler and Color Doppler Indications:    Chest pain; R07.2 Precordial pain  History:        Patient has prior history of Echocardiogram examinations, most  recent 10/13/2019. Arrythmias:Atrial Fibrillation and                 Tachycardia.  Sonographer:    Roosvelt Maser RDCS Referring Phys: EMA KULWA IMPRESSIONS  1. Left ventricular ejection fraction, by estimation, is 60 to 65%. The left ventricle has normal function. The left ventricle has no regional wall motion abnormalities. Indeterminate diastolic filling due to E-A fusion.  2. Right ventricular systolic function is normal. The right ventricular size is normal.  3. The mitral valve is normal in structure. Trivial mitral valve regurgitation.  4. The aortic valve is tricuspid. Aortic valve regurgitation is not visualized. No aortic stenosis is present.  5. The inferior vena cava is normal in size with greater than 50% respiratory variability, suggesting right atrial pressure of 3 mmHg. Comparison(s): No significant change from prior study. Conclusion(s)/Recommendation(s): Normal biventricular function without evidence of hemodynamically significant valvular heart disease. FINDINGS  Left Ventricle: Left ventricular  ejection fraction, by estimation, is 60 to 65%. The left ventricle has normal function. The left ventricle has no regional wall motion abnormalities. The left ventricular internal cavity size was normal in size. There is  no left ventricular hypertrophy. Indeterminate diastolic filling due to E-A fusion. Right Ventricle: The right ventricular size is normal. No increase in right ventricular wall thickness. Right ventricular systolic function is normal. Left Atrium: Left atrial size was normal in size. Right Atrium: Right atrial size was normal in size. Pericardium: There is no evidence of pericardial effusion. Mitral Valve: The mitral valve is normal in structure. Trivial mitral valve regurgitation. Tricuspid Valve: The tricuspid valve is normal in structure. Tricuspid valve regurgitation is trivial. Aortic Valve: The aortic valve is tricuspid. Aortic valve regurgitation is not visualized. No aortic stenosis is present. Aortic valve mean gradient measures 9.0 mmHg. Aortic valve peak gradient measures 17.6 mmHg. Aortic valve area, by VTI measures 2.01  cm. Pulmonic Valve: The pulmonic valve was normal in structure. Pulmonic valve regurgitation is not visualized. Aorta: The aortic root is normal in size and structure. Venous: The inferior vena cava is normal in size with greater than 50% respiratory variability, suggesting right atrial pressure of 3 mmHg. IAS/Shunts: The atrial septum is grossly normal.  LEFT VENTRICLE PLAX 2D LVIDd:         4.20 cm LVIDs:         2.70 cm LV PW:         1.00 cm LV IVS:        1.00 cm LVOT diam:     1.80 cm LV SV:         58 LV SV Index:   30 LVOT Area:     2.54 cm  RIGHT VENTRICLE RV Basal diam:  3.00 cm LEFT ATRIUM             Index        RIGHT ATRIUM           Index LA diam:        2.70 cm 1.42 cm/m   RA Area:     12.20 cm LA Vol (A2C):   37.7 ml 19.80 ml/m  RA Volume:   28.40 ml  14.92 ml/m LA Vol (A4C):   41.5 ml 21.80 ml/m LA Biplane Vol: 39.6 ml 20.80 ml/m  AORTIC VALVE  AV Area (Vmax):    1.99 cm AV Area (Vmean):   2.01 cm AV Area (VTI):     2.01 cm AV Vmax:  210.00 cm/s AV Vmean:          139.000 cm/s AV VTI:            0.288 m AV Peak Grad:      17.6 mmHg AV Mean Grad:      9.0 mmHg LVOT Vmax:         164.00 cm/s LVOT Vmean:        110.000 cm/s LVOT VTI:          0.228 m LVOT/AV VTI ratio: 0.79  AORTA Ao Root diam: 2.40 cm Ao Asc diam:  2.40 cm Ao Arch diam: 2.5 cm  SHUNTS Systemic VTI:  0.23 m Systemic Diam: 1.80 cm Laurance Flatten MD Electronically signed by Laurance Flatten MD Signature Date/Time: 11/04/2021/6:28:45 PM    Final      Patient Profile     26 y.o. female post partum with CP and pre-eclampsia  Assessment & Plan    #Pre-Eclampsia: #Gestational HTN: #Persistent HTN in Post-Partum Period: #Pleuritic Chest Pain: - normal echo and no evidence of PE - repeat BP on  nifedipine 90mg  daily and labetalol 300mg  BID - we discussed that the above long term increase cardiac risk of disease   #History of Paroxysmal Afib CHADSVASC 2:  -Will need outpatient follow-up  Planned 11/06/21 Discharge reasonable from cardiac perspective - will arrange one time [facility] f/u then return to her primary cardiologist   For questions or updates, please contact [facility] Please consult [email or web address] for contact info under Cardiology/STEMI.      01/07/22, MD FASE Cardiologist [facility]  [address] [place], [address] [place], [place] [phone]  8:51 AM

## 2021-11-07 ENCOUNTER — Ambulatory Visit: Payer: Self-pay

## 2021-11-07 NOTE — Lactation Note (Signed)
This note was copied from a baby's chart.  NICU Lactation Consultation Note  Patient Name: Joyce Franco [id number] Date: 11/07/2021 Age:28 days  Subjective Reason for consult: Follow-up assessment; NICU baby; Infant < 6lbs; Late-preterm 34-36.6wks; Primapara; 1st time breastfeeding  Visited with mom of 46 30/47 weeks old (adjusted) NICU female per SLP request. This LC got on report that family was willing to start the IDF 2 phase but upon entering the room, Joyce Franco voiced that she'd rather hold on to feedings/attempts at the breast because baby had a brady episode yesterday when she put her to breast after pumping. Reviewed infant readiness and offered assistance with latch once baby is ready, possibly next week. Family requested SLP to be present for their evaluation; SLP reported back to this [facility] that they'll be stopping by again on Monday, family was informed and agreeable to SLP visit on 11/10/21. Mom reports that pumping is going well and that she keeps freezing her milk at home due to abundant supply, praised her for her efforts.  Objective Infant data: Mother's Current Feeding Choice: Breast Milk Infant feeding assessment Scale for Readiness: 3 Scale for Quality: 2   Maternal data: G1P0101  Vaginal, Spontaneous WIC Program: Yes WIC Referral Sent?: Yes Pump: Personal (Pump in Style at home)  Assessment Infant: LATCH Score: 7  Maternal: Abundant supply  Intervention/Plan Interventions: Education  Plan of care: Encouraged mom to continue pumping consistently every 3 hours, at least 8 pumping sessions/24 hours She'll attempt to put baby back to breast next week for IDF 1   Female visitor present. All questions and concerns answered, family to contact [facility] services PRN.   Consult Status: NICU follow-up NICU Follow-up type: Weekly NICU follow up; Assist with IDF-1 (Mother to pre-pump before breastfeeding)   Joyce Franco 11/07/2021, 1:06 PM

## 2021-11-08 ENCOUNTER — Telehealth (HOSPITAL_COMMUNITY): Payer: Self-pay

## 2021-11-08 NOTE — Telephone Encounter (Signed)
Patient did not answer phone call. Voicemail left for patient.   [name] [facility] 11/08/2021,1427

## 2021-11-10 ENCOUNTER — Ambulatory Visit: Payer: Medicaid Other

## 2021-11-16 ENCOUNTER — Ambulatory Visit: Payer: Self-pay

## 2021-11-16 NOTE — Lactation Note (Signed)
This note was copied from a baby's chart.  NICU Lactation Consultation Note  Patient Name: Joyce Franco Date: 11/16/2021 Age:28 wk.o.   Subjective Reason for consult: Follow-up assessment; Primapara; 1st time breastfeeding; NICU baby; Late-preterm 34-36.6wks  Lactation followed up with Ms. Joyce Franco. Baby Torri's plan has been changed to ad lib. Joyce Franco has been bottle feeding baby. We discussed her long term feeding goals, and she has expressed interest in breastfeeding.  I reviewed breastfeeding support resources and recommended an OP lactation consult with weighted feeding assessment after discharge. I also highly recommended coming to breastfeeding support group.  We discussed LPI feeding behaviors and the need to continue with supplementation via bottle, even after breastfeeding. I recommended follow up with guidance from a lactation professional if she desires to breastfeed after discharge.  I invited Joyce Franco to contact me if she would like assistance and/or observation of breastfeeding while baby is a patient. She verbalized understanding.  I provided 8 oz bottles for pumping as Joyce Franco states that she fills the 4 ounce bottles beyond capacity when pumping.   Objective Infant data: Mother's Current Feeding Choice: Breast Milk  Infant feeding assessment Scale for Readiness: 1 Scale for Quality: 2  Maternal data: G1P0101  Vaginal, Spontaneous Current breast feeding challenges:: NICU  Does the patient have breastfeeding experience prior to this delivery?: No  Pumping frequency: q3-4 times a day Pumped volume: 240 mL  WIC Program: Yes WIC Referral Sent?: Yes Pump: Personal (Medela and a Freemie)  Assessment  Maternal: Milk volume: Abundant   Intervention/Plan Interventions: Breast feeding basics reviewed; Education  Tools: Bottle; Other (comment) (8 oz bottles) Pump Education: Setup, frequency, and cleaning  Plan: Consult Status: NICU  follow-up  NICU Follow-up type: Assist with IDF-2 (Mother does not need to pre-pump before breastfeeding)    Joyce Franco 11/16/2021, 2:57 PM

## 2021-11-17 ENCOUNTER — Ambulatory Visit: Payer: Self-pay

## 2021-11-17 NOTE — Lactation Note (Signed)
This note was copied from a baby's chart.  NICU Lactation Consultation Note  Patient Name: Joyce Franco [id number] Date: 11/17/2021 Age:28 wk.o.   Subjective Reason for consult: Follow-up assessment; Preterm <34wks; Late-preterm 34-36.6wks  Lactation followed up with Joyce Franco. She reports that she breastfed one time yesterday with the assistance of her RN, and that baby latched well. I encouraged her to breastfeed as desired, and provided reassurance that she could do breast and bottle without setting baby's overall progress back.   I asked that Joyce Franco contact me today if she would like assistance with breastfeeding.  Joyce Franco thanked me for the 8 oz bottles provided today and noted that she was able to pump more with these additions.  Objective Infant data: Mother's Current Feeding Choice: Breast Milk  Infant feeding assessment Scale for Readiness: 1 Scale for Quality: 2  Maternal data: G1P0101  Vaginal, Spontaneous Current breast feeding challenges:: NICU  Does the patient have breastfeeding experience prior to this delivery?: No  Pumping frequency: q3-4 times a day Pumped volume: 240 mL   WIC Program: Yes WIC Referral Sent?: Yes Pump: Personal (Medela and a Freemie)  Assessment Infant: LATCH Score: 10   Maternal: Milk volume: Abundant   Intervention/Plan Interventions: Breast feeding basics reviewed; Education  Tools: Bottle; Other (comment) (8 oz bottles) Pump Education: Setup, frequency, and cleaning  Plan: Consult Status: NICU follow-up  NICU Follow-up type: Assist with IDF-2 (Mother does not need to pre-pump before breastfeeding)    Joyce Franco 11/17/2021, 10:51 AM

## 2021-11-27 NOTE — Progress Notes (Signed)
Cardio-Obstetrics Clinic  New Evaluation  Date:  11/28/2021   ID:  Joyce Franco, DOB [birth year], MRN [id number]  PCP:  Pcp, No   [facility] Providers Cardiologist:  None  Electrophysiologist:  None     Referring MD: No ref. provider found   Chief Complaint: Afib, pre-eclampsia  History of Present Illness:    Joyce Franco is a 26 y.o. female [G1P0101] who is being seen today for the evaluation of pre-eclampsia and Afib at the request of No ref. provider found.   Patient has known history of paroxysmal Afib diagnosed in 10/2019 in the setting of endometriosis. TSH was normal. TEE with LVEF 60-65%, normal RV, no significant valve disease. She ultimately underwent TEE/DCCV with successful return to NSR. She was last seen in follow-up in 10/2019 where she was doing well from a CV standpoint. She completed 1 month of apixaban but never followed up with Cardiology afterwards.   Was seen at [facility] on 11/04/21 where she presented with pre-eclampsia with severely elevated blood pressures. She was induced at 33 weeks and delivered on 10/31/21 via vaginal delivery. Post-delivery course complicated by persistent hypertension and pleuritic chest pain for which Cardiology was consulted. Trop 6>6. ECG with sinus tachycardia with HR 123bpm. TTE 11/04/21 showed LVEF 60-65%, normal RV, trivial MR. CTA Dec 03, 2021 with no evidence of PE. She was discharged nifedipine 90mg  daily and labetalol 300mg  BID.  Patient overall feels okay since being out of the hospital. Has been taking her medications at home but not monitoring the blood pressure. Has had 4 headaches and rare episodes of floaters which she correlates with the heat. No chest pain, palpitations, lightheadedness, dizziness, LE edema, orthopnea or PND. She is breast feeding without issue.  Prior CV Studies Reviewed: The following studies were reviewed today: CTA 12/03/2021: EXAM: CT ANGIOGRAPHY CHEST WITH CONTRAST   TECHNIQUE: Multidetector CT  imaging of the chest was performed using the standard protocol during bolus administration of intravenous contrast. Multiplanar CT image reconstructions and MIPs were obtained to evaluate the vascular anatomy.   RADIATION DOSE REDUCTION: This exam was performed according to the departmental dose-optimization program which includes automated exposure control, adjustment of the mA and/or kV according to patient size and/or use of iterative reconstruction technique.   CONTRAST:  OMNIPAQUE IOHEXOL 350 MG/ML SOLN   COMPARISON:  None Available.   FINDINGS: Cardiovascular: No evidence of pulmonary embolus. Normal heart size. No pericardial effusion. Normal caliber thoracic aorta with no atherosclerotic disease.   Mediastinum/Nodes: Esophagus is unremarkable. Right thyroid nodule measuring 2.1 cm. No pathologically enlarged lymph nodes seen in the chest.   Lungs/Pleura: Central airways are patent. No consolidation, pleural effusion or pneumothorax.   Upper Abdomen: No acute abnormality.   Musculoskeletal: No chest wall abnormality. No acute or significant osseous findings.   Review of the MIP images confirms the above findings.   IMPRESSION: 1. No evidence of acute pulmonary embolus or acute airspace opacity. 2. Right thyroid nodule measuring 2.1 cm. Recommend further evaluation with thyroid ultrasound which can be performed non emergently. Reference: J Am Coll Radiol. 2015 Feb;12(2): 143-50   TTE 11/04/21: IMPRESSIONS     1. Left ventricular ejection fraction, by estimation, is 60 to 65%. The  left ventricle has normal function. The left ventricle has no regional  wall motion abnormalities. Indeterminate diastolic filling due to E-A  fusion.   2. Right ventricular systolic function is normal. The right ventricular  size is normal.   3. The mitral valve is normal  in structure. Trivial mitral valve  regurgitation.   4. The aortic valve is tricuspid. Aortic valve  regurgitation is not  visualized. No aortic stenosis is present.   5. The inferior vena cava is normal in size with greater than 50%  respiratory variability, suggesting right atrial pressure of 3 mmHg.   Comparison(s): No significant change from prior study.   Conclusion(s)/Recommendation(s): Normal biventricular function without  evidence of hemodynamically significant valvular heart disease.   Past Medical History:  Diagnosis Date   Asthma    Atrial fibrillation (HCC) 2021   Endometriosis, uterus    Fractured pelvis (HCC)    Headache    Hyperemesis affecting pregnancy, antepartum    Medical history non-contributory    Urticaria     Past Surgical History:  Procedure Laterality Date   ABLATION ON ENDOMETRIOSIS  11/08/2014   Procedure: ABLATION ON ENDOMETRIOSIS;  Surgeon: Geryl Rankins, MD;  Location: [facility]S;  Service: Gynecology;;   CARDIOVERSION N/A 10/13/2019   Procedure: CARDIOVERSION;  Surgeon: Chrystie Nose, MD;  Location: [facility] ENDOSCOPY;  Service: Cardiovascular;  Laterality: N/A;   CHROMOPERTUBATION  11/08/2014   Procedure: CHROMOPERTUBATION;  Surgeon: Geryl Rankins, MD;  Location: [facility];  Service: Gynecology;;   FOOT SURGERY     LAPAROSCOPY N/A 11/08/2014   Procedure: LAPAROSCOPY DIAGNOSTIC with peritoneal  biopsy;  Surgeon: Geryl Rankins, MD;  Location: [facility];  Service: Gynecology;  Laterality: N/A;   TEE WITHOUT CARDIOVERSION N/A 10/13/2019   Procedure: TRANSESOPHAGEAL ECHOCARDIOGRAM (TEE);  Surgeon: Chrystie Nose, MD;  Location: [facility] ENDOSCOPY;  Service: Cardiovascular;  Laterality: N/A;      OB History     Gravida  1   Para  1   Term  0   Preterm  1   AB  0   Living  1      SAB  0   IAB  0   Ectopic  0   Multiple  0   Live Births  1               Current Medications: Current Meds  Medication Sig   acetaminophen (TYLENOL) 325 MG tablet Take 2 tablets (650 mg total) by mouth every 6 (six) hours as needed for moderate pain or mild pain  (for pain scale < 4  OR  temperature  >/=  100.5 F).   Prenatal Vit-Fe Fumarate-FA (PRENATAL VITAMINS) 28-0.8 MG TABS Take 1 tablet by mouth daily.   [DISCONTINUED] labetalol (NORMODYNE) 300 MG tablet Take 1 tablet (300 mg total) by mouth 2 (two) times daily.   [DISCONTINUED] NIFEdipine (PROCARDIA XL/NIFEDICAL-XL) 90 MG 24 hr tablet Take 1 tablet (90 mg total) by mouth daily.     Allergies:   Contrast media [iodinated contrast media] and Ibuprofen   Social History   Socioeconomic History   Marital status: Single    Spouse name: Not on file   Number of children: Not on file   Years of education: Not on file   Highest education level: Not on file  Occupational History   Not on file  Tobacco Use   Smoking status: Former    Types: Cigars    Passive exposure: Yes   Smokeless tobacco: Never   Tobacco comments:    boyfriend smokes inside his home  Vaping Use   Vaping Use: Former   Substances: Nicotine, THC, CBD, Flavoring  Substance and Sexual Activity   Alcohol use: Not Currently    Comment: occ   Drug use: No  Sexual activity: Yes  Other Topics Concern   Not on file  Social History Narrative   Not on file   Social Determinants of Health   Financial Resource Strain: Not on file  Food Insecurity: Not on file  Transportation Needs: Not on file  Physical Activity: Not on file  Stress: Not on file  Social Connections: Not on file      Family History  Problem Relation Age of Onset   Healthy Mother    Healthy Father       ROS:   Please see the history of present illness.    All other systems reviewed and are negative.   Labs/EKG Reviewed:    EKG:   EKG is  ordered today.  The ekg ordered today demonstrates sinus tachycardia with HR 112  Recent Labs: 04/12/2021: Magnesium 1.8 07/30/2021: TSH 0.771 11/04/2021: ALT 42; BUN 8; Creatinine, Ser 0.66; Potassium 3.9; Sodium 139 11/05/2021: Hemoglobin 10.4; Platelets 310   Recent Lipid Panel No results found for:  "CHOL", "TRIG", "HDL", "CHOLHDL", "LDLCALC", "LDLDIRECT"  Physical Exam:    VS:  BP 116/70   Pulse (!) 112   Ht 5\' 4"  (1.626 m)   Wt 182 lb 6.4 oz (82.7 kg)   LMP 03/09/2021   SpO2 97%   BMI 31.31 kg/m     Wt Readings from Last 3 Encounters:  11/28/21 182 lb 6.4 oz (82.7 kg)  10/30/21 187 lb 9.6 oz (85.1 kg)  10/11/21 198 lb 12.8 oz (90.2 kg)     GEN:  Well nourished, well developed in no acute distress HEENT: Normal NECK: No JVD; No carotid bruits CARDIAC: Tachycardic, regular. No murmurs, rubs, gallops RESPIRATORY:  Clear to auscultation without rales, wheezing or rhonchi  ABDOMEN: Soft, non-tender, non-distended MUSCULOSKELETAL:  No edema; No deformity  SKIN: Warm and dry NEUROLOGIC:  Alert and oriented x 3 PSYCHIATRIC:  Normal affect    Risk Assessment/Risk Calculators:   {        CHA2DS2-VASc Score = 2  This indicates a 2.2% annual risk of stroke. The patient's score is based upon: CHF History: 0 HTN History: 1 Diabetes History: 0 Stroke History: 0 Vascular Disease History: 0 Age Score: 0 Gender Score: 1       ASSESSMENT & PLAN:      #Pre-Eclampsia: #Gestational HTN: #Persistent HTN in Post-Partum Period: Patient diagnosed with gestational HTN during her pregnancy which progressed to severe pre-eclampsia requiring induction at 33 weeks. Currently, blood pressure is well controlled in the office but she is not monitoring at home. Discussed that we need her to monitor at home so we can ensure she is well controlled throughout the day. Will see back in 48months for re-evaluation. -Continue nifedipine 90mg  daily -Continue labetalol 300mg  BID -Check BP at home on home BP cuff -3 month follow-up   #History of Paroxysmal Afib:  Presented in 10/2019 with new onset Afib with RVR ultimately receiving TEE/DCCV at that time. Completed 1 month of apixaban. TEE with normal BiV function and no significant valve disease. Unclear trigger of Afib and patient was lost  to follow-up. If recurs, will refer to EP.  #Pleuritic Chest Pain: #SOB: #Sinus Tachycardia: Reassuring work-up. CTA with no evidence of PE. TTE with normal BiV function without significant valve disease. Currently, symptoms have resolved.   Patient Instructions  Medication Instructions:   Your physician recommends that you continue on your current medications as directed. Please refer to the Current Medication list given to you today.  *If you  need a refill on your cardiac medications before your next appointment, please call your pharmacy*   Follow-Up:  3 MONTHS WITH DR. Shari Prows HERE AT [facility]    Important Information About Sugar         Dispo:  No follow-ups on file.   Medication Adjustments/Labs and Tests Ordered: Current medicines are reviewed at length with the patient today.  Concerns regarding medicines are outlined above.  Tests Ordered: Orders Placed This Encounter  Procedures   EKG 12-Lead   Medication Changes: Meds ordered this encounter  Medications   labetalol (NORMODYNE) 300 MG tablet    Sig: Take 1 tablet (300 mg total) by mouth 2 (two) times daily.    Dispense:  180 tablet    Refill:  1   NIFEdipine (PROCARDIA XL/NIFEDICAL-XL) 90 MG 24 hr tablet    Sig: Take 1 tablet (90 mg total) by mouth daily.    Dispense:  90 tablet    Refill:  1

## 2021-11-28 ENCOUNTER — Encounter: Payer: Self-pay | Admitting: Cardiology

## 2021-11-28 ENCOUNTER — Ambulatory Visit (INDEPENDENT_AMBULATORY_CARE_PROVIDER_SITE_OTHER): Payer: Medicaid Other | Admitting: Cardiology

## 2021-11-28 VITALS — BP 116/70 | HR 112 | Ht 64.0 in | Wt 182.4 lb

## 2021-11-28 DIAGNOSIS — I480 Paroxysmal atrial fibrillation: Secondary | ICD-10-CM | POA: Diagnosis not present

## 2021-11-28 DIAGNOSIS — R000 Tachycardia, unspecified: Secondary | ICD-10-CM | POA: Diagnosis not present

## 2021-11-28 DIAGNOSIS — R0781 Pleurodynia: Secondary | ICD-10-CM

## 2021-11-28 DIAGNOSIS — O1413 Severe pre-eclampsia, third trimester: Secondary | ICD-10-CM

## 2021-11-28 MED ORDER — NIFEDIPINE ER OSMOTIC RELEASE 90 MG PO TB24: 90.0000 mg | ORAL_TABLET | Freq: Every day | ORAL | 1 refills | Status: DC

## 2021-11-28 MED ORDER — LABETALOL HCL 300 MG PO TABS: 300.0000 mg | ORAL_TABLET | Freq: Two times a day (BID) | ORAL | 1 refills | Status: DC

## 2021-11-28 NOTE — Patient Instructions (Signed)
Medication Instructions:   Your physician recommends that you continue on your current medications as directed. Please refer to the Current Medication list given to you today.  *If you need a refill on your cardiac medications before your next appointment, please call your pharmacy*   Follow-Up:  3 MONTHS WITH DR. Shari Prows HERE AT [facility]    Important Information About Sugar

## 2022-03-05 NOTE — Progress Notes (Signed)
Cardio-Obstetrics Clinic  Follow-up:  Date:  03/06/2022   ID:  Carolynn Comment, DOB 04-08-1996, MRN [id number]  PCP:  Pcp, No   [facility] Providers Cardiologist:  Freada Bergeron, MD  Electrophysiologist:  None     Referring MD: No ref. provider found   Chief Complaint: Afib, pre-eclampsia  History of Present Illness:    Joyce Franco is a 26 y.o. female [id number] who is being seen today for the evaluation of pre-eclampsia and Afib at the request of No ref. provider found.   Patient has known history of paroxysmal Afib diagnosed in 10/2019 in the setting of endometriosis. TSH was normal. TEE with LVEF 60-65%, normal RV, no significant valve disease. She ultimately underwent TEE/DCCV with successful return to NSR. She was last seen in follow-up in 10/2019 where she was doing well from a CV standpoint. She completed 1 month of apixaban but never followed up with Cardiology afterwards.   Was seen at [facility] on 11/04/21 where she presented with pre-eclampsia with severely elevated blood pressures. She was induced at 33 weeks and delivered on 10/31/21 via vaginal delivery. Post-delivery course complicated by persistent hypertension and pleuritic chest pain for which Cardiology was consulted. Trop 6>6. ECG with sinus tachycardia with HR 123bpm. TTE 11/04/21 showed LVEF 60-65%, normal RV, trivial MR. CTA 11-17-21 with no evidence of PE. She was discharged nifedipine 90mg  daily and labetalol 300mg  BID.  Was last seen in clinic 11/2021 where she was feeling better with no significant CV complaints. BP was controlled in the office.  Today, the patient overall feels okay. Blood pressure has been running mainly 140/90s at home despite being compliant with her medications. Continues to have HA but has been responding to Fioricet.  No palpitations, LE edema, orthopnea, or PND. Has mild dyspnea on exertion which she attributes to her weight and this has been stable.   Of note, she lost her sister in  12/2021 and has been struggling with her loss.    Prior CV Studies Reviewed: The following studies were reviewed today: CTA 2021-11-17: EXAM: CT ANGIOGRAPHY CHEST WITH CONTRAST   TECHNIQUE: Multidetector CT imaging of the chest was performed using the standard protocol during bolus administration of intravenous contrast. Multiplanar CT image reconstructions and MIPs were obtained to evaluate the vascular anatomy.   RADIATION DOSE REDUCTION: This exam was performed according to the departmental dose-optimization program which includes automated exposure control, adjustment of the mA and/or kV according to patient size and/or use of iterative reconstruction technique.   CONTRAST:  175mL OMNIPAQUE IOHEXOL 350 MG/ML SOLN   COMPARISON:  None Available.   FINDINGS: Cardiovascular: No evidence of pulmonary embolus. Normal heart size. No pericardial effusion. Normal caliber thoracic aorta with no atherosclerotic disease.   Mediastinum/Nodes: Esophagus is unremarkable. Right thyroid nodule measuring 2.1 cm. No pathologically enlarged lymph nodes seen in the chest.   Lungs/Pleura: Central airways are patent. No consolidation, pleural effusion or pneumothorax.   Upper Abdomen: No acute abnormality.   Musculoskeletal: No chest wall abnormality. No acute or significant osseous findings.   Review of the MIP images confirms the above findings.   IMPRESSION: 1. No evidence of acute pulmonary embolus or acute airspace opacity. 2. Right thyroid nodule measuring 2.1 cm. Recommend further evaluation with thyroid ultrasound which can be performed non emergently. Reference: J Am Coll Radiol. 2015 Feb;12(2): 143-50   TTE 11/04/21: IMPRESSIONS     1. Left ventricular ejection fraction, by estimation, is 60 to 65%. The  left ventricle has  normal function. The left ventricle has no regional  wall motion abnormalities. Indeterminate diastolic filling due to E-A  fusion.   2. Right  ventricular systolic function is normal. The right ventricular  size is normal.   3. The mitral valve is normal in structure. Trivial mitral valve  regurgitation.   4. The aortic valve is tricuspid. Aortic valve regurgitation is not  visualized. No aortic stenosis is present.   5. The inferior vena cava is normal in size with greater than 50%  respiratory variability, suggesting right atrial pressure of 3 mmHg.   Comparison(s): No significant change from prior study.   Conclusion(s)/Recommendation(s): Normal biventricular function without  evidence of hemodynamically significant valvular heart disease.   Past Medical History:  Diagnosis Date   Asthma    Atrial fibrillation ([place]) 2021   Endometriosis, uterus    Fractured pelvis ([place])    Headache    Hyperemesis affecting pregnancy, antepartum    Medical history non-contributory    Urticaria     Past Surgical History:  Procedure Laterality Date   ABLATION ON ENDOMETRIOSIS  11/08/2014   Procedure: ABLATION ON ENDOMETRIOSIS;  Surgeon: Thurnell Lose, MD;  Location: [facility];  Service: Gynecology;;   CARDIOVERSION N/A 10/13/2019   Procedure: CARDIOVERSION;  Surgeon: Pixie Casino, MD;  Location: [facility] ENDOSCOPY;  Service: Cardiovascular;  Laterality: N/A;   CHROMOPERTUBATION  11/08/2014   Procedure: CHROMOPERTUBATION;  Surgeon: Thurnell Lose, MD;  Location: [facility];  Service: Gynecology;;   FOOT SURGERY     LAPAROSCOPY N/A 11/08/2014   Procedure: LAPAROSCOPY DIAGNOSTIC with peritoneal  biopsy;  Surgeon: Thurnell Lose, MD;  Location: [facility];  Service: Gynecology;  Laterality: N/A;   TEE WITHOUT CARDIOVERSION N/A 10/13/2019   Procedure: TRANSESOPHAGEAL ECHOCARDIOGRAM (TEE);  Surgeon: Pixie Casino, MD;  Location: [facility] ENDOSCOPY;  Service: Cardiovascular;  Laterality: N/A;      OB History     Gravida  1   Para  1   Term  0   Preterm  1   AB  0   Living  1      SAB  0   IAB  0   Ectopic  0   Multiple  0   Live Births   1               Current Medications: Current Meds  Medication Sig   acetaminophen (TYLENOL) 325 MG tablet Take 2 tablets (650 mg total) by mouth every 6 (six) hours as needed for moderate pain or mild pain (for pain scale < 4  OR  temperature  >/=  100.5 F).   Butalbital-APAP-Caffeine 50-300-40 MG CAPS Take 1 capsule by mouth every 6 (six) hours as needed.   cyclobenzaprine (FLEXERIL) 10 MG tablet Take 10 mg by mouth 3 (three) times daily.   hydrochlorothiazide (HYDRODIURIL) 25 MG tablet Take 1 tablet (25 mg total) by mouth daily.   labetalol (NORMODYNE) 300 MG tablet Take 1 tablet (300 mg total) by mouth 2 (two) times daily.   levonorgestrel (MIRENA) 20 MCG/DAY IUD 1 each by Intrauterine route once.   NIFEdipine (PROCARDIA XL/NIFEDICAL-XL) 90 MG 24 hr tablet Take 1 tablet (90 mg total) by mouth daily.   potassium chloride (KLOR-CON M) 10 MEQ tablet Take 1 tablet (10 mEq total) by mouth daily.     Allergies:   Contrast media [iodinated contrast media] and Ibuprofen   Social History   Socioeconomic History   Marital status: Single    Spouse name: Not on file  Number of children: Not on file   Years of education: Not on file   Highest education level: Not on file  Occupational History   Not on file  Tobacco Use   Smoking status: Former    Types: Cigars    Passive exposure: Yes   Smokeless tobacco: Never   Tobacco comments:    boyfriend smokes inside his home  Vaping Use   Vaping Use: Former   Substances: Nicotine, THC, CBD, Flavoring  Substance and Sexual Activity   Alcohol use: Not Currently    Comment: occ   Drug use: No   Sexual activity: Yes  Other Topics Concern   Not on file  Social History Narrative   Not on file   Social Determinants of Health   Financial Resource Strain: Not on file  Food Insecurity: Not on file  Transportation Needs: Not on file  Physical Activity: Not on file  Stress: Not on file  Social Connections: Not on file      Family  History  Problem Relation Age of Onset   Healthy Mother    Healthy Father       ROS:   Please see the history of present illness.    All other systems reviewed and are negative.   Labs/EKG Reviewed:    EKG:   No new ECG  Recent Labs: 04/12/2021: Magnesium 1.8 07/30/2021: TSH 0.771 11/04/2021: ALT 42; BUN 8; Creatinine, Ser 0.66; Potassium 3.9; Sodium 139 11/05/2021: Hemoglobin 10.4; Platelets 310   Recent Lipid Panel No results found for: "CHOL", "TRIG", "HDL", "CHOLHDL", "LDLCALC", "LDLDIRECT"  Physical Exam:    VS:  BP (!) 150/100   Pulse (!) 119   Ht 5\' 4"  (1.626 m)   Wt 196 lb (88.9 kg)   LMP 01/23/2022   SpO2 100%   BMI 33.64 kg/m     Wt Readings from Last 3 Encounters:  03/06/22 196 lb (88.9 kg)  11/28/21 182 lb 6.4 oz (82.7 kg)  10/30/21 187 lb 9.6 oz (85.1 kg)     GEN:  Well nourished, well developed in no acute distress HEENT: Normal NECK: No JVD; No carotid bruits CARDIAC: Tachycardic, regular. No murmurs, rubs, gallops RESPIRATORY:  Clear to auscultation without rales, wheezing or rhonchi  ABDOMEN: Soft, non-tender, non-distended MUSCULOSKELETAL:  No edema; No deformity  SKIN: Warm and dry NEUROLOGIC:  Alert and oriented x 3 PSYCHIATRIC:  Normal affect    Risk Assessment/Risk Calculators:   {        CHA2DS2-VASc Score = 2  This indicates a 2.2% annual risk of stroke. The patient's score is based upon: CHF History: 0 HTN History: 1 Diabetes History: 0 Stroke History: 0 Vascular Disease History: 0 Age Score: 0 Gender Score: 1       ASSESSMENT & PLAN:      #Pre-Eclampsia: #Gestational HTN: #Persistent HTN in Post-Partum Period: Patient diagnosed with gestational HTN during her pregnancy which progressed to severe pre-eclampsia requiring induction at 33 weeks. Continues to have elevated blood pressure today despite compliance with medications. Will start HCTZ as below. Patient is concerned about low K in the past so will start low dose  K supplementation with HCTZ as well.  -Continue nifedipine 90mg  daily -Continue labetalol 300mg  BID -Start HCTZ 25mg  daily with K 16mEq daily -Refer to pharm D for further med titration   #History of Paroxysmal Afib:  Presented in 10/2019 with new onset Afib with RVR ultimately receiving TEE/DCCV at that time. Completed 1 month of apixaban. TEE with  normal BiV function and no significant valve disease. Unclear trigger of Afib and patient was lost to follow-up. If recurs, will refer to EP.  #Pleuritic Chest Pain: #SOB: #Sinus Tachycardia: Reassuring work-up. CTA with no evidence of PE. TTE with normal BiV function without significant valve disease. Currently, symptoms have resolved.   Patient Instructions  Medication Instructions:  Your physician has recommended you make the following change in your medication:  1-Take hydrochlorothiazide 25 mg by mouth daily 2-Take potassium 10 meq by mouth daily.  *If you need a refill on your cardiac medications before your next appointment, please call your pharmacy*  Lab Work: Your physician recommends that you return for lab work in: 1 week for BMET  If you have labs (blood work) drawn today and your tests are completely normal, you will receive your results only by: [place] (if you have MyChart) OR A paper copy in the mail If you have any lab test that is abnormal or we need to change your treatment, we will call you to review the results.  Follow-Up: At [facility], you and your health needs are our priority.  As part of our continuing mission to provide you with exceptional heart care, we have created designated Provider Care Teams.  These Care Teams include your primary Cardiologist (physician) and Advanced Practice Providers (APPs -  Physician Assistants and Nurse Practitioners) who all work together to provide you with the care you need, when you need it.  We recommend signing up for the patient portal called "MyChart".   Sign up information is provided on this After Visit Summary.  MyChart is used to connect with patients for Virtual Visits (Telemedicine).  Patients are able to view lab/test results, encounter notes, upcoming appointments, etc.  Non-urgent messages can be sent to your provider as well.   To learn more about what you can do with MyChart, go to [email or web address].    Your next appointment:   3 month(s)  The format for your next appointment:   In Person  Provider:   Freada Bergeron, MD     Other Instructions You have been referred to Hypertension Clinic (please schedule with Melissa)   Important Information About Sugar         Dispo:  No follow-ups on file.   Medication Adjustments/Labs and Tests Ordered: Current medicines are reviewed at length with the patient today.  Concerns regarding medicines are outlined above.  Tests Ordered: Orders Placed This Encounter  Procedures   Basic metabolic panel   AMB Referral to [facility]care Pharm-D   Medication Changes: Meds ordered this encounter  Medications   hydrochlorothiazide (HYDRODIURIL) 25 MG tablet    Sig: Take 1 tablet (25 mg total) by mouth daily.    Dispense:  90 tablet    Refill:  3   potassium chloride (KLOR-CON M) 10 MEQ tablet    Sig: Take 1 tablet (10 mEq total) by mouth daily.    Dispense:  90 tablet    Refill:  3

## 2022-03-06 ENCOUNTER — Encounter: Payer: Self-pay | Admitting: Cardiology

## 2022-03-06 ENCOUNTER — Ambulatory Visit (INDEPENDENT_AMBULATORY_CARE_PROVIDER_SITE_OTHER): Payer: Medicaid Other | Admitting: Cardiology

## 2022-03-06 VITALS — BP 150/100 | HR 119 | Ht 64.0 in | Wt 196.0 lb

## 2022-03-06 DIAGNOSIS — O1413 Severe pre-eclampsia, third trimester: Secondary | ICD-10-CM | POA: Diagnosis not present

## 2022-03-06 DIAGNOSIS — I480 Paroxysmal atrial fibrillation: Secondary | ICD-10-CM | POA: Diagnosis not present

## 2022-03-06 DIAGNOSIS — I10 Essential (primary) hypertension: Secondary | ICD-10-CM | POA: Diagnosis not present

## 2022-03-06 DIAGNOSIS — R000 Tachycardia, unspecified: Secondary | ICD-10-CM

## 2022-03-06 MED ORDER — POTASSIUM CHLORIDE CRYS ER 10 MEQ PO TBCR: 10.0000 meq | EXTENDED_RELEASE_TABLET | Freq: Every day | ORAL | 3 refills | Status: AC

## 2022-03-06 MED ORDER — HYDROCHLOROTHIAZIDE 25 MG PO TABS: 25.0000 mg | ORAL_TABLET | Freq: Every day | ORAL | 3 refills | Status: DC

## 2022-03-06 NOTE — Patient Instructions (Signed)
Medication Instructions:  Your physician has recommended you make the following change in your medication:  1-Take hydrochlorothiazide 25 mg by mouth daily 2-Take potassium 10 meq by mouth daily.  *If you need a refill on your cardiac medications before your next appointment, please call your pharmacy*  Lab Work: Your physician recommends that you return for lab work in: 1 week for BMET  If you have labs (blood work) drawn today and your tests are completely normal, you will receive your results only by: [place] (if you have MyChart) OR A paper copy in the mail If you have any lab test that is abnormal or we need to change your treatment, we will call you to review the results.  Follow-Up: At [facility], you and your health needs are our priority.  As part of our continuing mission to provide you with exceptional heart care, we have created designated Provider Care Teams.  These Care Teams include your primary Cardiologist (physician) and Advanced Practice Providers (APPs -  Physician Assistants and Nurse Practitioners) who all work together to provide you with the care you need, when you need it.  We recommend signing up for the patient portal called "MyChart".  Sign up information is provided on this After Visit Summary.  MyChart is used to connect with patients for Virtual Visits (Telemedicine).  Patients are able to view lab/test results, encounter notes, upcoming appointments, etc.  Non-urgent messages can be sent to your provider as well.   To learn more about what you can do with MyChart, go to [email or web address].    Your next appointment:   3 month(s)  The format for your next appointment:   In Person  Provider:   Freada Bergeron, MD     Other Instructions You have been referred to Hypertension Clinic (please schedule with Melissa)   Important Information About Sugar

## 2022-03-13 ENCOUNTER — Ambulatory Visit: Payer: Medicaid Other

## 2022-03-16 ENCOUNTER — Other Ambulatory Visit: Payer: Medicaid Other

## 2022-03-18 ENCOUNTER — Encounter: Payer: Self-pay | Admitting: Maternal & Fetal Medicine

## 2022-04-20 ENCOUNTER — Ambulatory Visit: Payer: Medicaid Other

## 2022-04-20 NOTE — Progress Notes (Unsigned)
Patient ID: Joyce Franco                 DOB: 1995/05/07                      MRN: [id number]     HPI: Joyce Franco is a 26 y.o. female referred by Dr. Shari Prows to HTN clinic. PMH is significant for afib with RVR, preeclampsia, and asthma. Echocardiogram 11/04/21 showed LVEF of 60-65%. She was admitted 11/2021 for preeclampsia. Her HTN has persisted after delivery of her baby. She was last seen by Dr. Shari Prows on 03/06/22 where she reported occasional headache with home BP readings consistently above goal. Clinic BP at that visit was 150/100 mmHg, so she was started on HCTZ. She has been referred to PharmD HTN clinic for medication titration.    Current HTN meds: HCTZ 25 mg daily, labetalol 300 mg BID, nifedipine ER 90 mg daily Previously tried:  BP goal: <130/80  Family History:   Social History:   Diet:   Exercise:   Home BP readings:   Wt Readings from Last 3 Encounters:  03/06/22 196 lb (88.9 kg)  11/28/21 182 lb 6.4 oz (82.7 kg)  10/30/21 187 lb 9.6 oz (85.1 kg)   BP Readings from Last 3 Encounters:  03/06/22 (!) 150/100  11/28/21 116/70  11/06/21 (!) 141/88   Pulse Readings from Last 3 Encounters:  03/06/22 (!) 119  11/28/21 (!) 112  11/06/21 91    Renal function: CrCl cannot be calculated (Patient's most recent lab result is older than the maximum 21 days allowed.).  Past Medical History:  Diagnosis Date   Asthma    Atrial fibrillation ([facility]) 2021   Endometriosis, uterus    Fractured pelvis ([facility])    Headache    Hyperemesis affecting pregnancy, antepartum    Medical history non-contributory    Urticaria     Current Outpatient Medications on File Prior to Visit  Medication Sig Dispense Refill   acetaminophen (TYLENOL) 325 MG tablet Take 2 tablets (650 mg total) by mouth every 6 (six) hours as needed for moderate pain or mild pain (for pain scale < 4  OR  temperature  >/=  100.5 F). 60 tablet 0   Butalbital-APAP-Caffeine 50-300-40 MG CAPS Take 1 capsule by  mouth every 6 (six) hours as needed.     cyclobenzaprine (FLEXERIL) 10 MG tablet Take 10 mg by mouth 3 (three) times daily.     hydrochlorothiazide (HYDRODIURIL) 25 MG tablet Take 1 tablet (25 mg total) by mouth daily. 90 tablet 3   labetalol (NORMODYNE) 300 MG tablet Take 1 tablet (300 mg total) by mouth 2 (two) times daily. 180 tablet 1   levonorgestrel (MIRENA) 20 MCG/DAY IUD 1 each by Intrauterine route once.     NIFEdipine (PROCARDIA XL/NIFEDICAL-XL) 90 MG 24 hr tablet Take 1 tablet (90 mg total) by mouth daily. 90 tablet 1   potassium chloride (KLOR-CON M) 10 MEQ tablet Take 1 tablet (10 mEq total) by mouth daily. 90 tablet 3   No current facility-administered medications on file prior to visit.    Allergies  Allergen Reactions   Contrast Media [Iodinated Contrast Media] Hives and Other (See Comments)    Warm feeling in back of throat 10 minutes after contrast   Ibuprofen Nausea And Vomiting     Assessment/Plan:  1. Hypertension -

## 2022-06-02 ENCOUNTER — Ambulatory Visit: Payer: Medicaid Other

## 2022-06-02 NOTE — Progress Notes (Deleted)
Patient ID: Joyce Franco                 DOB: 14-Sep-1995                      MRN: [id number]      HPI: Joyce Franco is a 27 y.o. female referred by Dr. Johney Frame to HTN clinic. PMH is significant for afib, gestational HTN during her pregnancy which progressed to severe pre-eclampsia requiring induction at 33 weeks, sinus tachycardia.  She was seen by Dr. Johney Frame for follow-up 03/06/2022.  Blood pressure was elevated despite reported compliance with her home medications.  Patient reported blood pressures around 140/90 at home.  HCTZ 25 mg daily along with 10 mEq daily of potassium were added.  Patient canceled her first lab appointment and no-show to the follow-up.  She also had to cancel her Pharm.D. appointment in December.  Breast-feeding? Home blood pressures? Needs BMP Increase labetalol 400 mg twice a day twice? Compliance?  Current HTN meds: Nifedipine 90 mg daily, labetalol 300 mg twice daily, hydrochlorothiazide 25 mg daily Previously tried:  BP goal: <130/80  Family History:   Social History:   Diet:   Exercise:  {types:28256}  Home BP readings:  Date SBP/DBP  HR                              Average      Wt Readings from Last 3 Encounters:  03/06/22 196 lb (88.9 kg)  11/28/21 182 lb 6.4 oz (82.7 kg)  10/30/21 187 lb 9.6 oz (85.1 kg)   BP Readings from Last 3 Encounters:  03/06/22 (!) 150/100  11/28/21 116/70  11/06/21 (!) 141/88   Pulse Readings from Last 3 Encounters:  03/06/22 (!) 119  11/28/21 (!) 112  11/06/21 91    Renal function: CrCl cannot be calculated (Patient's most recent lab result is older than the maximum 21 days allowed.).  Past Medical History:  Diagnosis Date   Asthma    Atrial fibrillation ([place]) 2021   Endometriosis, uterus    Fractured pelvis ([place])    Headache    Hyperemesis affecting pregnancy, antepartum    Medical history non-contributory    Urticaria     Current Outpatient Medications on File Prior to Visit   Medication Sig Dispense Refill   acetaminophen (TYLENOL) 325 MG tablet Take 2 tablets (650 mg total) by mouth every 6 (six) hours as needed for moderate pain or mild pain (for pain scale < 4  OR  temperature  >/=  100.5 F). 60 tablet 0   Butalbital-APAP-Caffeine 50-300-40 MG CAPS Take 1 capsule by mouth every 6 (six) hours as needed.     cyclobenzaprine (FLEXERIL) 10 MG tablet Take 10 mg by mouth 3 (three) times daily.     hydrochlorothiazide (HYDRODIURIL) 25 MG tablet Take 1 tablet (25 mg total) by mouth daily. 90 tablet 3   labetalol (NORMODYNE) 300 MG tablet Take 1 tablet (300 mg total) by mouth 2 (two) times daily. 180 tablet 1   levonorgestrel (MIRENA) 20 MCG/DAY IUD 1 each by Intrauterine route once.     NIFEdipine (PROCARDIA XL/NIFEDICAL-XL) 90 MG 24 hr tablet Take 1 tablet (90 mg total) by mouth daily. 90 tablet 1   potassium chloride (KLOR-CON M) 10 MEQ tablet Take 1 tablet (10 mEq total) by mouth daily. 90 tablet 3   No current facility-administered medications on file prior to visit.  Allergies  Allergen Reactions   Contrast Media [Iodinated Contrast Media] Hives and Other (See Comments)    Warm feeling in back of throat 10 minutes after contrast   Ibuprofen Nausea And Vomiting    unknown if currently breastfeeding.   Assessment/Plan:  1. Hypertension -  No problem-specific Assessment & Plan notes found for this encounter.      Thank you  Ramond Dial, Pharm.D, BCPS, CPP [facility] of [facility] [address], [place], [place] [address]  Phone: [phone]; Fax: [phone]

## 2022-06-16 NOTE — Progress Notes (Signed)
Cardio-Obstetrics Clinic  Follow-up:  Date:  06/19/2022   ID:  Joyce Franco, DOB [birth year], MRN [id number]  PCP:  Pcp, No   [facility] Providers Cardiologist:  Freada Bergeron, MD  Electrophysiologist:  None     Referring MD: No ref. provider found   Chief Complaint: Afib, pre-eclampsia  History of Present Illness:    Joyce Franco is a 27 y.o. female [id number] who is being seen today for the evaluation of pre-eclampsia and Afib at the request of No ref. provider found.   Patient has known history of paroxysmal Afib diagnosed in 10/2019 in the setting of endometriosis. TSH was normal. TEE with LVEF 60-65%, normal RV, no significant valve disease. She ultimately underwent TEE/DCCV with successful return to NSR. She was last seen in follow-up in 10/2019 where she was doing well from a CV standpoint. She completed 1 month of apixaban but never followed up with Cardiology afterwards.   Was seen at [facility] on 11/04/21 where she presented with pre-eclampsia with severely elevated blood pressures. She was induced at 33 weeks and delivered on 10/31/21 via vaginal delivery. Post-delivery course complicated by persistent hypertension and pleuritic chest pain for which Cardiology was consulted. Trop 6>6. ECG with sinus tachycardia with HR 123bpm. TTE 11/04/21 showed LVEF 60-65%, normal RV, trivial MR. CTA 2021/11/21 with no evidence of PE. She was discharged nifedipine 35m daily and labetalol 3067mBID.  Was last seen in clinic 03/2022 where her blood pressure was elevated and we started HCTZ at that time.   Today, the patient overall feels okay. Continues to have intermittent headaches, but they are improved on the medication. States she is also fatigued which she attributes some to having a new baby and also due to her medications. No chest pain, SOB, orthopnea or PND. HR persistently in 100-120s since delivery while at rest. Has continued palpitations as well as dyspnea, which is  unchanged.  Prior CV Studies Reviewed: The following studies were reviewed today: CTA 0721-Jul-2023EXAM: CT ANGIOGRAPHY CHEST WITH CONTRAST   TECHNIQUE: Multidetector CT imaging of the chest was performed using the standard protocol during bolus administration of intravenous contrast. Multiplanar CT image reconstructions and MIPs were obtained to evaluate the vascular anatomy.   RADIATION DOSE REDUCTION: This exam was performed according to the departmental dose-optimization program which includes automated exposure control, adjustment of the mA and/or kV according to patient size and/or use of iterative reconstruction technique.   CONTRAST:  10059mMNIPAQUE IOHEXOL 350 MG/ML SOLN   COMPARISON:  None Available.   FINDINGS: Cardiovascular: No evidence of pulmonary embolus. Normal heart size. No pericardial effusion. Normal caliber thoracic aorta with no atherosclerotic disease.   Mediastinum/Nodes: Esophagus is unremarkable. Right thyroid nodule measuring 2.1 cm. No pathologically enlarged lymph nodes seen in the chest.   Lungs/Pleura: Central airways are patent. No consolidation, pleural effusion or pneumothorax.   Upper Abdomen: No acute abnormality.   Musculoskeletal: No chest wall abnormality. No acute or significant osseous findings.   Review of the MIP images confirms the above findings.   IMPRESSION: 1. No evidence of acute pulmonary embolus or acute airspace opacity. 2. Right thyroid nodule measuring 2.1 cm. Recommend further evaluation with thyroid ultrasound which can be performed non emergently. Reference: J Am Coll Radiol. 2015 Feb;12(2): 143-50   TTE 11/04/21: IMPRESSIONS     1. Left ventricular ejection fraction, by estimation, is 60 to 65%. The  left ventricle has normal function. The left ventricle has no regional  wall motion abnormalities.  Indeterminate diastolic filling due to E-A  fusion.   2. Right ventricular systolic function is normal. The  right ventricular  size is normal.   3. The mitral valve is normal in structure. Trivial mitral valve  regurgitation.   4. The aortic valve is tricuspid. Aortic valve regurgitation is not  visualized. No aortic stenosis is present.   5. The inferior vena cava is normal in size with greater than 50%  respiratory variability, suggesting right atrial pressure of 3 mmHg.   Comparison(s): No significant change from prior study.   Conclusion(s)/Recommendation(s): Normal biventricular function without  evidence of hemodynamically significant valvular heart disease.   Past Medical History:  Diagnosis Date   Asthma    Atrial fibrillation ([place]) 2021   Endometriosis, uterus    Fractured pelvis ([place])    Headache    Hyperemesis affecting pregnancy, antepartum    Medical history non-contributory    Urticaria     Past Surgical History:  Procedure Laterality Date   ABLATION ON ENDOMETRIOSIS  11/08/2014   Procedure: ABLATION ON ENDOMETRIOSIS;  Surgeon: Thurnell Lose, MD;  Location: [facility];  Service: Gynecology;;   CARDIOVERSION N/A 10/13/2019   Procedure: CARDIOVERSION;  Surgeon: Pixie Casino, MD;  Location: [facility] ENDOSCOPY;  Service: Cardiovascular;  Laterality: N/A;   CHROMOPERTUBATION  11/08/2014   Procedure: CHROMOPERTUBATION;  Surgeon: Thurnell Lose, MD;  Location: [facility];  Service: Gynecology;;   FOOT SURGERY     LAPAROSCOPY N/A 11/08/2014   Procedure: LAPAROSCOPY DIAGNOSTIC with peritoneal  biopsy;  Surgeon: Thurnell Lose, MD;  Location: [facility];  Service: Gynecology;  Laterality: N/A;   TEE WITHOUT CARDIOVERSION N/A 10/13/2019   Procedure: TRANSESOPHAGEAL ECHOCARDIOGRAM (TEE);  Surgeon: Pixie Casino, MD;  Location: [facility] ENDOSCOPY;  Service: Cardiovascular;  Laterality: N/A;      OB History     Gravida  1   Para  1   Term  0   Preterm  1   AB  0   Living  1      SAB  0   IAB  0   Ectopic  0   Multiple  0   Live Births  1               Current  Medications: Current Meds  Medication Sig   acetaminophen (TYLENOL) 325 MG tablet Take 2 tablets (650 mg total) by mouth every 6 (six) hours as needed for moderate pain or mild pain (for pain scale < 4  OR  temperature  >/=  100.5 F).   Butalbital-APAP-Caffeine 50-300-40 MG CAPS Take 1 capsule by mouth every 6 (six) hours as needed.   cyclobenzaprine (FLEXERIL) 10 MG tablet Take 10 mg by mouth 3 (three) times daily.   diltiazem (CARDIZEM CD) 240 MG 24 hr capsule Take 1 capsule (240 mg total) by mouth daily.   hydrochlorothiazide (HYDRODIURIL) 25 MG tablet Take 1 tablet (25 mg total) by mouth daily.   labetalol (NORMODYNE) 300 MG tablet Take 1 tablet (300 mg total) by mouth 2 (two) times daily.   levonorgestrel (MIRENA) 20 MCG/DAY IUD 1 each by Intrauterine route once.   potassium chloride (KLOR-CON M) 10 MEQ tablet Take 1 tablet (10 mEq total) by mouth daily.   [DISCONTINUED] NIFEdipine (PROCARDIA XL/NIFEDICAL-XL) 90 MG 24 hr tablet Take 1 tablet (90 mg total) by mouth daily.     Allergies:   Contrast media [iodinated contrast media] and Ibuprofen   Social History   Socioeconomic History   Marital status: Single  Spouse name: Not on file   Number of children: Not on file   Years of education: Not on file   Highest education level: Not on file  Occupational History   Not on file  Tobacco Use   Smoking status: Former    Types: Cigars    Passive exposure: Yes   Smokeless tobacco: Never   Tobacco comments:    boyfriend smokes inside his home  Vaping Use   Vaping Use: Former   Substances: Nicotine, THC, CBD, Flavoring  Substance and Sexual Activity   Alcohol use: Not Currently    Franco: occ   Drug use: No   Sexual activity: Yes  Other Topics Concern   Not on file  Social History Narrative   Not on file   Social Determinants of Health   Financial Resource Strain: Not on file  Food Insecurity: Not on file  Transportation Needs: Not on file  Physical Activity: Not on  file  Stress: Not on file  Social Connections: Not on file      Family History  Problem Relation Age of Onset   Healthy Mother    Healthy Father       ROS:   Please see the history of present illness.    All other systems reviewed and are negative.   Labs/EKG Reviewed:    EKG:   Sinus tachycardia with HR 108bpm  Recent Labs: 07/30/2021: TSH 0.771 11/04/2021: ALT 42; BUN 8; Creatinine, Ser 0.66; Potassium 3.9; Sodium 139 11/05/2021: Hemoglobin 10.4; Platelets 310   Recent Lipid Panel No results found for: "CHOL", "TRIG", "HDL", "CHOLHDL", "LDLCALC", "LDLDIRECT"  Physical Exam:    VS:  BP 130/88   Pulse (!) 125   Ht 5' 4"$  (1.626 m)   Wt 192 lb (87.1 kg)   SpO2 97%   BMI 32.96 kg/m     Wt Readings from Last 3 Encounters:  06/19/22 192 lb (87.1 kg)  03/06/22 196 lb (88.9 kg)  11/28/21 182 lb 6.4 oz (82.7 kg)     GEN:  Well nourished, well developed in no acute distress HEENT: Normal NECK: No JVD; No carotid bruits CARDIAC: Tachycardic, regular. No murmurs, rubs, gallops RESPIRATORY:  CTAB ABDOMEN: Soft, non-tender, non-distended MUSCULOSKELETAL:  No edema; No deformity  SKIN: Warm and dry NEUROLOGIC:  Alert and oriented x 3 PSYCHIATRIC:  Normal affect    Risk Assessment/Risk Calculators:   {        CHA2DS2-VASc Score = 2  This indicates a 2.2% annual risk of stroke. The patient's score is based upon: CHF History: 0 HTN History: 1 Diabetes History: 0 Stroke History: 0 Vascular Disease History: 0 Age Score: 0 Gender Score: 1       ASSESSMENT & PLAN:      #Pre-Eclampsia: #Gestational HTN: #Persistent HTN in Post-Partum Period: Patient diagnosed with gestational HTN during her pregnancy which progressed to severe pre-eclampsia requiring induction at 33 weeks. Has been started on nifedipine, labetalol and HCTZ with improvement. Currently, doing well but given her persistent tachycardia, will trial changing from nifedipine to dilt to see if this  helps. -Change nifedipine to dilt 259m daily given tachycardia -Continue labetalol 3060mBID -Continue HCTZ 2556maily with K 66m37maily -Refer to pharm D for further med titration   #History of Paroxysmal Afib:  Presented in 10/2019 with new onset Afib with RVR ultimately receiving TEE/DCCV at that time. Completed 1 month of apixaban. TEE with normal BiV function and no significant valve disease. Unclear trigger of Afib  and patient was lost to follow-up. If recurs, will refer to EP.  #Sinus Tachycardia: Has been persistent since her delivery. HR 120s initially today but improved to low 100s on exam. Prior work-up for PE negative. TTE with normal BiV function without significant valve disease. She is hydrating well. Will trial dilt as above and monitor response. Will also check 3 day zio to monitor further. -Check 3 day zio -Change nifedipine to dilt as above -Will see back in 3 months  #Pleuritic Chest Pain: #SOB: Reassuring work-up. CTA with no evidence of PE. TTE with normal BiV function without significant valve disease.   Patient Instructions  Medication Instructions:   STOP TAKING NIFEDIPINE NOW  START TAKING DILTIAZEM (CARDIZEM CD) 240 MG BY MOUTH DAILY  *If you need a refill on your cardiac medications before your next appointment, please call your pharmacy*   Testing/Procedures:  ZIO XT- Long Term Monitor Instructions  Your physician has requested you wear a ZIO patch monitor for 3 days.  This is a single patch monitor. [employer] supplies one patch monitor per enrollment. Additional stickers are not available. Please do not apply patch if you will be having a Nuclear Stress Test,  Echocardiogram, Cardiac CT, MRI, or Chest Xray during the period you would be wearing the  monitor. The patch cannot be worn during these tests. You cannot remove and re-apply the  ZIO XT patch monitor.  Your ZIO patch monitor will be mailed 3 day USPS to your address on file. It may take 3-5  days  to receive your monitor after you have been enrolled.  Once you have received your monitor, please review the enclosed instructions. Your monitor  has already been registered assigning a specific monitor serial # to you.  Billing and Patient Assistance Program Information  We have supplied [employer] with any of your insurance information on file for billing purposes. [employer] offers a sliding scale Patient Assistance Program for patients that do not have  insurance, or whose insurance does not completely cover the cost of the ZIO monitor.  You must apply for the Patient Assistance Program to qualify for this discounted rate.  To apply, please call [employer] at [phone], select option 4, select option 2, ask to apply for  Patient Assistance Program. Theodore Demark will ask your household income, and how many people  are in your household. They will quote your out-of-pocket cost based on that information.  [employer] will also be able to set up a 23-month interest-free payment plan if needed.  Applying the monitor   Shave hair from upper left chest.  Hold abrader disc by orange tab. Rub abrader in 40 strokes over the upper left chest as  indicated in your monitor instructions.  Clean area with 4 enclosed alcohol pads. Let dry.  Apply patch as indicated in monitor instructions. Patch will be placed under collarbone on left  side of chest with arrow pointing upward.  Rub patch adhesive wings for 2 minutes. Remove white label marked "1". Remove the white  label marked "2". Rub patch adhesive wings for 2 additional minutes.  While looking in a mirror, press and release button in center of patch. A small green light will  flash 3-4 times. This will be your only indicator that the monitor has been turned on.  Do not shower for the first 24 hours. You may shower after the first 24 hours.  Press the button if you feel a symptom. You will hear a small click. Record Date, Time and  Symptom in the  Patient Logbook.  When you are ready to remove the patch, follow instructions on the last 2 pages of Patient  Logbook. Stick patch monitor onto the last page of Patient Logbook.  Place Patient Logbook in the blue and white box. Use locking tab on box and tape box closed  securely. The blue and white box has prepaid postage on it. Please place it in the mailbox as  soon as possible. Your physician should have your test results approximately 7 days after the  monitor has been mailed back to [facility].  Call [place] at [phone] if you have questions regarding  your ZIO XT patch monitor. Call them immediately if you see an orange light blinking on your  monitor.  If your monitor falls off in less than 4 days, contact our Monitor department at [phone].  If your monitor becomes loose or falls off after 4 days call [employer] at [phone] for  suggestions on securing your monitor    Follow-Up:  3 MONTHS WITH DR. Johney Frame HERE AT [place]:  No follow-ups on file.   Medication Adjustments/Labs and Tests Ordered: Current medicines are reviewed at length with the patient today.  Concerns regarding medicines are outlined above.  Tests Ordered: Orders Placed This Encounter  Procedures   LONG TERM MONITOR (3-14 DAYS)   Medication Changes: Meds ordered this encounter  Medications   diltiazem (CARDIZEM CD) 240 MG 24 hr capsule    Sig: Take 1 capsule (240 mg total) by mouth daily.    Dispense:  90 capsule    Refill:  1

## 2022-06-19 ENCOUNTER — Telehealth: Payer: Self-pay | Admitting: *Deleted

## 2022-06-19 ENCOUNTER — Encounter: Payer: Self-pay | Admitting: Cardiology

## 2022-06-19 ENCOUNTER — Ambulatory Visit: Payer: Medicaid Other | Attending: Cardiology

## 2022-06-19 ENCOUNTER — Ambulatory Visit (INDEPENDENT_AMBULATORY_CARE_PROVIDER_SITE_OTHER): Payer: Medicaid Other | Admitting: Cardiology

## 2022-06-19 VITALS — BP 130/88 | HR 125 | Ht 64.0 in | Wt 192.0 lb

## 2022-06-19 DIAGNOSIS — I10 Essential (primary) hypertension: Secondary | ICD-10-CM

## 2022-06-19 DIAGNOSIS — R000 Tachycardia, unspecified: Secondary | ICD-10-CM

## 2022-06-19 DIAGNOSIS — O1413 Severe pre-eclampsia, third trimester: Secondary | ICD-10-CM

## 2022-06-19 DIAGNOSIS — I480 Paroxysmal atrial fibrillation: Secondary | ICD-10-CM | POA: Diagnosis not present

## 2022-06-19 DIAGNOSIS — R0781 Pleurodynia: Secondary | ICD-10-CM

## 2022-06-19 DIAGNOSIS — Z8759 Personal history of other complications of pregnancy, childbirth and the puerperium: Secondary | ICD-10-CM

## 2022-06-19 MED ORDER — DILTIAZEM HCL ER COATED BEADS 240 MG PO CP24: 240.0000 mg | ORAL_CAPSULE | Freq: Every day | ORAL | 1 refills | Status: DC

## 2022-06-19 NOTE — Patient Instructions (Signed)
Medication Instructions:   STOP TAKING NIFEDIPINE NOW  START TAKING DILTIAZEM (CARDIZEM CD) 240 MG BY MOUTH DAILY  *If you need a refill on your cardiac medications before your next appointment, please call your pharmacy*   Testing/Procedures:  [place] Monitor Instructions  Your physician has requested you wear a ZIO patch monitor for 3 days.  This is a single patch monitor. [employer] supplies one patch monitor per enrollment. Additional stickers are not available. Please do not apply patch if you will be having a Nuclear Stress Test,  Echocardiogram, Cardiac CT, MRI, or Chest Xray during the period you would be wearing the  monitor. The patch cannot be worn during these tests. You cannot remove and re-apply the  ZIO XT patch monitor.  Your ZIO patch monitor will be mailed 3 day USPS to your address on file. It may take 3-5 days  to receive your monitor after you have been enrolled.  Once you have received your monitor, please review the enclosed instructions. Your monitor  has already been registered assigning a specific monitor serial # to you.  Billing and Patient Assistance Program Information  We have supplied [employer] with any of your insurance information on file for billing purposes. [employer] offers a sliding scale Patient Assistance Program for patients that do not have  insurance, or whose insurance does not completely cover the cost of the ZIO monitor.  You must apply for the Patient Assistance Program to qualify for this discounted rate.  To apply, please call [employer] at [phone], select option 4, select option 2, ask to apply for  Patient Assistance Program. Theodore Demark will ask your household income, and how many people  are in your household. They will quote your out-of-pocket cost based on that information.  [employer] will also be able to set up a 59-month interest-free payment plan if needed.  Applying the monitor   Shave hair from upper left chest.  Hold  abrader disc by orange tab. Rub abrader in 40 strokes over the upper left chest as  indicated in your monitor instructions.  Clean area with 4 enclosed alcohol pads. Let dry.  Apply patch as indicated in monitor instructions. Patch will be placed under collarbone on left  side of chest with arrow pointing upward.  Rub patch adhesive wings for 2 minutes. Remove white label marked "1". Remove the white  label marked "2". Rub patch adhesive wings for 2 additional minutes.  While looking in a mirror, press and release button in center of patch. A small green light will  flash 3-4 times. This will be your only indicator that the monitor has been turned on.  Do not shower for the first 24 hours. You may shower after the first 24 hours.  Press the button if you feel a symptom. You will hear a small click. Record Date, Time and  Symptom in the Patient Logbook.  When you are ready to remove the patch, follow instructions on the last 2 pages of Patient  Logbook. Stick patch monitor onto the last page of Patient Logbook.  Place Patient Logbook in the blue and white box. Use locking tab on box and tape box closed  securely. The blue and white box has prepaid postage on it. Please place it in the mailbox as  soon as possible. Your physician should have your test results approximately 7 days after the  [facility]  Call [employer] [phone] you have questions  regarding  your ZIO XT patch monitor. Call them immediately if you see an orange light blinking on your  monitor.  If your monitor falls off in less than 4 days, contact our Monitor department at [phone].  If your monitor becomes loose or falls off after 4 days call [employer] at [phone] for  suggestions on securing your monitor    Follow-Up:  3 MONTHS WITH DR. Johney Frame HERE AT [facility]

## 2022-06-19 NOTE — Telephone Encounter (Signed)
-----   Message from Gerarda Gunther sent at 06/19/2022  2:03 PM EST ----- Regarding: RE: 3 DAY ZIO PER DR. Johney Frame done ----- Message ----- From: Nuala Alpha, LPN Sent: [id number]   1:56 PM EST To: Nuala Alpha, LPN; Shelly A Wells; # Subject: 3 DAY ZIO PER DR. Johney Frame                    Cardio OB pt needs 3 day zio for tachycardia and history of PAF  Please enroll and let me know when you do?   Thanks [employer]

## 2022-06-19 NOTE — Progress Notes (Unsigned)
Enrolled patient for a 3 day ZIo XT monitor to be mailed to patients home

## 2022-06-23 NOTE — Addendum Note (Signed)
Addended by: Janan Halter F on: 06/23/2022 01:13 PM   Modules accepted: Orders

## 2022-09-24 NOTE — Progress Notes (Deleted)
Cardio-Obstetrics Clinic  Follow-up:  Date:  06/19/2022   ID:  Joyce Franco, DOB 06/06/1995, MRN [id number]  PCP:  Pcp, No   [facility] Providers Cardiologist:  Meriam Sprague, MD  Electrophysiologist:  None     Referring MD: No ref. provider found   Chief Complaint: Afib, pre-eclampsia  History of Present Illness:    Joyce Franco is a 28 y.o. female [G1P0101] who presents to clinic for follow-up for pre-eclampsia and history of Afib.  Patient has known history of paroxysmal Afib diagnosed in 10/2019 in the setting of endometriosis. TSH was normal. TEE with LVEF 60-65%, normal RV, no significant valve disease. She ultimately underwent TEE/DCCV with successful return to NSR. She was last seen in follow-up in 10/2019 where she was doing well from a CV standpoint. She completed 1 month of apixaban but never followed up with Cardiology afterwards.   Was seen at [facility] on 11/04/21 where she presented with pre-eclampsia with severely elevated blood pressures. She was induced at 33 weeks and delivered on 10/31/21 via vaginal delivery. Post-delivery course complicated by persistent hypertension and pleuritic chest pain for which Cardiology was consulted. Trop 6>6. ECG with sinus tachycardia with HR 123bpm. TTE 11/04/21 showed LVEF 60-65%, normal RV, trivial MR. CTA 11-29-21 with no evidence of PE. She was discharged nifedipine 90mg  daily and labetalol 300mg  BID.  Was last seen in clinic 06/2022 where she was having palpitations. Cardiac monitor was ordered but was not completed.  Today,***  Prior CV Studies Reviewed: The following studies were reviewed today: CTA 11/29/21: EXAM: CT ANGIOGRAPHY CHEST WITH CONTRAST   TECHNIQUE: Multidetector CT imaging of the chest was performed using the standard protocol during bolus administration of intravenous contrast. Multiplanar CT image reconstructions and MIPs were obtained to evaluate the vascular anatomy.   RADIATION DOSE REDUCTION:  This exam was performed according to the departmental dose-optimization program which includes automated exposure control, adjustment of the mA and/or kV according to patient size and/or use of iterative reconstruction technique.   CONTRAST:  OMNIPAQUE IOHEXOL 350 MG/ML SOLN   COMPARISON:  None Available.   FINDINGS: Cardiovascular: No evidence of pulmonary embolus. Normal heart size. No pericardial effusion. Normal caliber thoracic aorta with no atherosclerotic disease.   Mediastinum/Nodes: Esophagus is unremarkable. Right thyroid nodule measuring 2.1 cm. No pathologically enlarged lymph nodes seen in the chest.   Lungs/Pleura: Central airways are patent. No consolidation, pleural effusion or pneumothorax.   Upper Abdomen: No acute abnormality.   Musculoskeletal: No chest wall abnormality. No acute or significant osseous findings.   Review of the MIP images confirms the above findings.   IMPRESSION: 1. No evidence of acute pulmonary embolus or acute airspace opacity. 2. Right thyroid nodule measuring 2.1 cm. Recommend further evaluation with thyroid ultrasound which can be performed non emergently. Reference: J Am Coll Radiol. 2015 Feb;12(2): 143-50   TTE 11/04/21: IMPRESSIONS     1. Left ventricular ejection fraction, by estimation, is 60 to 65%. The  left ventricle has normal function. The left ventricle has no regional  wall motion abnormalities. Indeterminate diastolic filling due to E-A  fusion.   2. Right ventricular systolic function is normal. The right ventricular  size is normal.   3. The mitral valve is normal in structure. Trivial mitral valve  regurgitation.   4. The aortic valve is tricuspid. Aortic valve regurgitation is not  visualized. No aortic stenosis is present.   5. The inferior vena cava is normal in size with greater than  50%  respiratory variability, suggesting right atrial pressure of 3 mmHg.   Comparison(s): No significant change  from prior study.   Conclusion(s)/Recommendation(s): Normal biventricular function without  evidence of hemodynamically significant valvular heart disease.   Past Medical History:  Diagnosis Date   Asthma    Atrial fibrillation (HCC) 2021   Endometriosis, uterus    Fractured pelvis (HCC)    Headache    Hyperemesis affecting pregnancy, antepartum    Medical history non-contributory    Urticaria     Past Surgical History:  Procedure Laterality Date   ABLATION ON ENDOMETRIOSIS  11/08/2014   Procedure: ABLATION ON ENDOMETRIOSIS;  Surgeon: Geryl Rankins, MD;  Location: [facility];  Service: Gynecology;;   CARDIOVERSION N/A 10/13/2019   Procedure: CARDIOVERSION;  Surgeon: Chrystie Nose, MD;  Location: [facility] ENDOSCOPY;  Service: Cardiovascular;  Laterality: N/A;   CHROMOPERTUBATION  11/08/2014   Procedure: CHROMOPERTUBATION;  Surgeon: Geryl Rankins, MD;  Location: [facility];  Service: Gynecology;;   FOOT SURGERY     LAPAROSCOPY N/A 11/08/2014   Procedure: LAPAROSCOPY DIAGNOSTIC with peritoneal  biopsy;  Surgeon: Geryl Rankins, MD;  Location: [facility];  Service: Gynecology;  Laterality: N/A;   TEE WITHOUT CARDIOVERSION N/A 10/13/2019   Procedure: TRANSESOPHAGEAL ECHOCARDIOGRAM (TEE);  Surgeon: Chrystie Nose, MD;  Location: [facility] ENDOSCOPY;  Service: Cardiovascular;  Laterality: N/A;      OB History     Gravida  1   Para  1   Term  0   Preterm  1   AB  0   Living  1      SAB  0   IAB  0   Ectopic  0   Multiple  0   Live Births  1               Current Medications: Current Meds  Medication Sig   acetaminophen (TYLENOL) 325 MG tablet Take 2 tablets (650 mg total) by mouth every 6 (six) hours as needed for moderate pain or mild pain (for pain scale < 4  OR  temperature  >/=  100.5 F).   Butalbital-APAP-Caffeine 50-300-40 MG CAPS Take 1 capsule by mouth every 6 (six) hours as needed.   cyclobenzaprine (FLEXERIL) 10 MG tablet Take 10 mg by mouth 3 (three) times daily.    diltiazem (CARDIZEM CD) 240 MG 24 hr capsule Take 1 capsule (240 mg total) by mouth daily.   hydrochlorothiazide (HYDRODIURIL) 25 MG tablet Take 1 tablet (25 mg total) by mouth daily.   labetalol (NORMODYNE) 300 MG tablet Take 1 tablet (300 mg total) by mouth 2 (two) times daily.   levonorgestrel (MIRENA) 20 MCG/DAY IUD 1 each by Intrauterine route once.   potassium chloride (KLOR-CON M) 10 MEQ tablet Take 1 tablet (10 mEq total) by mouth daily.   [DISCONTINUED] NIFEdipine (PROCARDIA XL/NIFEDICAL-XL) 90 MG 24 hr tablet Take 1 tablet (90 mg total) by mouth daily.     Allergies:   Contrast media [iodinated contrast media] and Ibuprofen   Social History   Socioeconomic History   Marital status: Single    Spouse name: Not on file   Number of children: Not on file   Years of education: Not on file   Highest education level: Not on file  Occupational History   Not on file  Tobacco Use   Smoking status: Former    Types: Cigars    Passive exposure: Yes   Smokeless tobacco: Never   Tobacco comments:    boyfriend smokes  inside his home  Vaping Use   Vaping Use: Former   Substances: Nicotine, THC, CBD, Flavoring  Substance and Sexual Activity   Alcohol use: Not Currently    Comment: occ   Drug use: No   Sexual activity: Yes  Other Topics Concern   Not on file  Social History Narrative   Not on file   Social Determinants of Health   Financial Resource Strain: Not on file  Food Insecurity: Not on file  Transportation Needs: Not on file  Physical Activity: Not on file  Stress: Not on file  Social Connections: Not on file      Family History  Problem Relation Age of Onset   Healthy Mother    Healthy Father       ROS:   Please see the history of present illness.    All other systems reviewed and are negative.   Labs/EKG Reviewed:    EKG:   Sinus tachycardia with HR 108bpm  Recent Labs: 07/30/2021: TSH 0.771 11/04/2021: ALT 42; BUN 8; Creatinine, Ser 0.66; Potassium  3.9; Sodium 139 11/05/2021: Hemoglobin 10.4; Platelets 310   Recent Lipid Panel No results found for: "CHOL", "TRIG", "HDL", "CHOLHDL", "LDLCALC", "LDLDIRECT"  Physical Exam:    VS:  BP 130/88   Pulse (!) 125   Ht 5\' 4"  (1.626 m)   Wt 192 lb (87.1 kg)   SpO2 97%   BMI 32.96 kg/m     Wt Readings from Last 3 Encounters:  06/19/22 192 lb (87.1 kg)  03/06/22 196 lb (88.9 kg)  11/28/21 182 lb 6.4 oz (82.7 kg)     GEN:  Well nourished, well developed in no acute distress HEENT: Normal NECK: No JVD; No carotid bruits CARDIAC: Tachycardic, regular. No murmurs, rubs, gallops RESPIRATORY:  CTAB ABDOMEN: Soft, non-tender, non-distended MUSCULOSKELETAL:  No edema; No deformity  SKIN: Warm and dry NEUROLOGIC:  Alert and oriented x 3 PSYCHIATRIC:  Normal affect    Risk Assessment/Risk Calculators:   {        CHA2DS2-VASc Score = 2  This indicates a 2.2% annual risk of stroke. The patient's score is based upon: CHF History: 0 HTN History: 1 Diabetes History: 0 Stroke History: 0 Vascular Disease History: 0 Age Score: 0 Gender Score: 1       ASSESSMENT & PLAN:      #Pre-Eclampsia: #Gestational HTN: #Persistent HTN in Post-Partum Period: Patient diagnosed with gestational HTN during her pregnancy which progressed to severe pre-eclampsia requiring induction at 33 weeks. Has been started on nifedipine, labetalol and HCTZ with improvement. Nifedipine changed to dilt for persistent tachycardia.  -Continue dilt 240mg  daily  -Continue labetalol 300mg  BID -Continue HCTZ 25mg  daily with K daily   #History of Paroxysmal Afib:  Presented in 10/2019 with new onset Afib with RVR ultimately receiving TEE/DCCV at that time. Completed 1 month of apixaban. TEE with normal BiV function and no significant valve disease. Unclear trigger of Afib and patient was lost to follow-up. If recurs, will refer to EP.  #Sinus Tachycardia: Prior work-up for PE negative. TTE with normal BiV  function without significant valve disease. Currently, *** -Continue dilt 240mg  dialy -Cardiac monitor not completed  #Pleuritic Chest Pain: #SOB: Reassuring work-up. CTA with no evidence of PE. TTE with normal BiV function without significant valve disease.   Patient Instructions  Medication Instructions:   STOP TAKING NIFEDIPINE NOW  START TAKING DILTIAZEM (CARDIZEM CD) 240 MG BY MOUTH DAILY  *If you need a refill on your  cardiac medications before your next appointment, please call your pharmacy*   Testing/Procedures:  ZIO XT- Long Term Monitor Instructions  Your physician has requested you wear a ZIO patch monitor for 3 days.  This is a single patch monitor. [employer] supplies one patch monitor per enrollment. Additional stickers are not available. Please do not apply patch if you will be having a Nuclear Stress Test,  Echocardiogram, Cardiac CT, MRI, or Chest Xray during the period you would be wearing the  monitor. The patch cannot be worn during these tests. You cannot remove and re-apply the  ZIO XT patch monitor.  Your ZIO patch monitor will be mailed 3 day USPS to your address on file. It may take 3-5 days  to receive your monitor after you have been enrolled.  Once you have received your monitor, please review the enclosed instructions. Your monitor  has already been registered assigning a specific monitor serial # to you.  Billing and Patient Assistance Program Information  We have supplied [employer] with any of your insurance information on file for billing purposes. [employer] offers a sliding scale Patient Assistance Program for patients that do not have  insurance, or whose insurance does not completely cover the cost of the ZIO monitor.  You must apply for the Patient Assistance Program to qualify for this discounted rate.  To apply, please call [employer] at [phone], select option 4, select option 2, ask to apply for  Patient Assistance Program. Meredeth Ide will ask  your household income, and how many people  are in your household. They will quote your out-of-pocket cost based on that information.  [employer] will also be able to set up a 55-month, interest-free payment plan if needed.  Applying the monitor   Shave hair from upper left chest.  Hold abrader disc by orange tab. Rub abrader in 40 strokes over the upper left chest as  indicated in your monitor instructions.  Clean area with 4 enclosed alcohol pads. Let dry.  Apply patch as indicated in monitor instructions. Patch will be placed under collarbone on left  side of chest with arrow pointing upward.  Rub patch adhesive wings for 2 minutes. Remove white label marked "1". Remove the white  label marked "2". Rub patch adhesive wings for 2 additional minutes.  While looking in a mirror, press and release button in center of patch. A small green light will  flash 3-4 times. This will be your only indicator that the monitor has been turned on.  Do not shower for the first 24 hours. You may shower after the first 24 hours.  Press the button if you feel a symptom. You will hear a small click. Record Date, Time and  Symptom in the Patient Logbook.  When you are ready to remove the patch, follow instructions on the last 2 pages of Patient  Logbook. Stick patch monitor onto the last page of Patient Logbook.  Place Patient Logbook in the blue and white box. Use locking tab on box and tape box closed  securely. The blue and white box has prepaid postage on it. Please place it in the mailbox as  soon as possible. Your physician should have your test results approximately 7 days after the  monitor has been mailed back to [facility].  Call [facility] Customer Care at [phone] if you have questions regarding  your ZIO XT patch monitor. Call them immediately if you see an orange light blinking on your  monitor.  If your monitor falls off  in less than 4 days, contact our Monitor department at  [phone].  If your monitor becomes loose or falls off after 4 days call [employer] at [phone] for  suggestions on securing your monitor    Follow-Up:  3 MONTHS WITH DR. Shari Prows HERE AT [facility]    Dispo:  No follow-ups on file.   Medication Adjustments/Labs and Tests Ordered: Current medicines are reviewed at length with the patient today.  Concerns regarding medicines are outlined above.  Tests Ordered: Orders Placed This Encounter  Procedures   LONG TERM MONITOR (3-14 DAYS)   Medication Changes: Meds ordered this encounter  Medications   diltiazem (CARDIZEM CD) 240 MG 24 hr capsule    Sig: Take 1 capsule (240 mg total) by mouth daily.    Dispense:  90 capsule    Refill:  1

## 2022-09-25 ENCOUNTER — Ambulatory Visit: Payer: Medicaid Other | Admitting: Cardiology

## 2022-10-09 ENCOUNTER — Other Ambulatory Visit: Payer: Self-pay | Admitting: Obstetrics and Gynecology

## 2022-10-09 DIAGNOSIS — E041 Nontoxic single thyroid nodule: Secondary | ICD-10-CM

## 2022-10-21 ENCOUNTER — Ambulatory Visit
Admission: RE | Admit: 2022-10-21 | Discharge: 2022-10-21 | Disposition: A | Discharge status: Home / Self Care | Payer: Medicaid Other | Source: Ambulatory Visit | Attending: Obstetrics and Gynecology | Admitting: Obstetrics and Gynecology

## 2022-10-21 DIAGNOSIS — E041 Nontoxic single thyroid nodule: Secondary | ICD-10-CM

## 2022-11-25 ENCOUNTER — Other Ambulatory Visit: Payer: Self-pay | Admitting: Family Medicine

## 2022-11-25 ENCOUNTER — Other Ambulatory Visit: Payer: Medicaid Other

## 2022-11-25 ENCOUNTER — Encounter: Payer: Self-pay | Admitting: Family Medicine

## 2022-11-25 ENCOUNTER — Ambulatory Visit: Payer: Medicaid Other | Admitting: Family Medicine

## 2022-11-25 VITALS — BP 124/84 | HR 113 | Temp 97.4°F | Ht 64.0 in | Wt 178.2 lb

## 2022-11-25 DIAGNOSIS — E041 Nontoxic single thyroid nodule: Secondary | ICD-10-CM

## 2022-11-25 DIAGNOSIS — Z131 Encounter for screening for diabetes mellitus: Secondary | ICD-10-CM

## 2022-11-25 DIAGNOSIS — Z13220 Encounter for screening for lipoid disorders: Secondary | ICD-10-CM | POA: Diagnosis not present

## 2022-11-25 DIAGNOSIS — G43009 Migraine without aura, not intractable, without status migrainosus: Secondary | ICD-10-CM | POA: Diagnosis not present

## 2022-11-25 DIAGNOSIS — R8290 Unspecified abnormal findings in urine: Secondary | ICD-10-CM

## 2022-11-25 DIAGNOSIS — N390 Urinary tract infection, site not specified: Secondary | ICD-10-CM

## 2022-11-25 DIAGNOSIS — R7989 Other specified abnormal findings of blood chemistry: Secondary | ICD-10-CM | POA: Diagnosis not present

## 2022-11-25 DIAGNOSIS — Z0001 Encounter for general adult medical examination with abnormal findings: Secondary | ICD-10-CM | POA: Diagnosis not present

## 2022-11-25 DIAGNOSIS — Z0000 Encounter for general adult medical examination without abnormal findings: Secondary | ICD-10-CM

## 2022-11-25 LAB — LIPID PANEL
Cholesterol: 154 mg/dL (ref 0–200)
HDL: 38.1 mg/dL — ABNORMAL LOW (ref >39.00)
LDL Cholesterol: 97 mg/dL (ref 0–99)
NonHDL: 116.09
Total CHOL/HDL Ratio: 4
Triglycerides: 97 mg/dL (ref 0.0–149.0)
VLDL: 19.4 mg/dL (ref 0.0–40.0)

## 2022-11-25 LAB — URINALYSIS, ROUTINE W REFLEX MICROSCOPIC
Bilirubin Urine: NEGATIVE
Hgb urine dipstick: NEGATIVE
Ketones, ur: NEGATIVE
Nitrite: POSITIVE — AB
Specific Gravity, Urine: 1.025 (ref 1.000–1.030)
Urine Glucose: NEGATIVE
Urobilinogen, UA: 0.2 (ref 0.0–1.0)
pH: 6 (ref 5.0–8.0)

## 2022-11-25 LAB — COMPREHENSIVE METABOLIC PANEL
ALT: 10 U/L (ref 0–35)
AST: 12 U/L (ref 0–37)
Albumin: 4.2 g/dL (ref 3.5–5.2)
Alkaline Phosphatase: 57 U/L (ref 39–117)
BUN: 13 mg/dL (ref 6–23)
CO2: 22 mEq/L (ref 19–32)
Calcium: 9.1 mg/dL (ref 8.4–10.5)
Chloride: 107 mEq/L (ref 96–112)
Creatinine, Ser: 0.89 mg/dL (ref 0.40–1.20)
GFR: 88.09 mL/min (ref >60.00)
Glucose, Bld: 85 mg/dL (ref 70–99)
Potassium: 4.1 mEq/L (ref 3.5–5.1)
Sodium: 138 mEq/L (ref 135–145)
Total Bilirubin: 0.5 mg/dL (ref 0.2–1.2)
Total Protein: 6.7 g/dL (ref 6.0–8.3)

## 2022-11-25 LAB — CBC
HCT: 41.4 % (ref 36.0–46.0)
Hemoglobin: 13.6 g/dL (ref 12.0–15.0)
MCHC: 33 g/dL (ref 30.0–36.0)
MCV: 82.2 fl (ref 78.0–100.0)
Platelets: 307 10*3/uL (ref 150.0–400.0)
RBC: 5.03 Mil/uL (ref 3.87–5.11)
RDW: 14.4 % (ref 11.5–15.5)
WBC: 8.8 10*3/uL (ref 4.0–10.5)

## 2022-11-25 LAB — T4, FREE: Free T4: 0.66 ng/dL (ref 0.60–1.60)

## 2022-11-25 LAB — HEMOGLOBIN A1C: Hgb A1c MFr Bld: 5.4 % (ref 4.6–6.5)

## 2022-11-25 LAB — TSH: TSH: 0.76 u[IU]/mL (ref 0.35–5.50)

## 2022-11-25 NOTE — Progress Notes (Addendum)
New Patient Office Visit  Subjective    Patient ID: Joyce Franco, female    DOB: 1996-01-05  Age: 27 y.o. MRN: [id number]  CC:  Chief Complaint  Patient presents with   Establish Care    Pt in office today to establish care. Pt states she has had some episodes of hypertension and thyroid nodules. Pt complains of headaches and fatigue and no appetite.     HPI Joyce Franco presents to establish care. Encounter Diagnoses  Name Primary?   Healthcare maintenance Yes   Thyroid nodule greater than or equal to 1.5 cm in diameter incidentally noted on imaging study    Low TSH level    Migraine without aura and without status migrainosus, not intractable    Screening for hyperlipidemia    Urinary tract infection without hematuria, site unspecified    For establishment of care and health check and follow-up for medical issues.  She brings her daughter Joyce Franco with her today who is 29 years old.  She has regular dental care and exercises regularly.  She lives with her mother and daughter.  Mom has a history of headaches that may be associated with hypertension.  Patient developed headaches during pregnancy that were diagnosed as migraines.  They start in the frontal area and moved to the left temporal area.  They may last for hours.  She seeks rest in a dark room.  They may be associated with nausea.  No prodromal symptoms.  She developed preeclampsia along with paroxysmal atrial fibs.  She is seeing cardiology.  Outpatient Encounter Medications as of 11/25/2022  Medication Sig   acetaminophen (TYLENOL) 325 MG tablet Take 2 tablets (650 mg total) by mouth every 6 (six) hours as needed for moderate pain or mild pain (for pain scale < 4  OR  temperature  >/=  100.5 F).   Butalbital-APAP-Caffeine 50-300-40 MG CAPS Take 1 capsule by mouth every 6 (six) hours as needed.   cyclobenzaprine (FLEXERIL) 10 MG tablet Take 10 mg by mouth 3 (three) times daily.   diltiazem (CARDIZEM CD) 240 MG 24 hr capsule  Take 1 capsule (240 mg total) by mouth daily.   hydrochlorothiazide (HYDRODIURIL) 25 MG tablet Take 1 tablet (25 mg total) by mouth daily.   labetalol (NORMODYNE) 300 MG tablet Take 1 tablet (300 mg total) by mouth 2 (two) times daily.   levonorgestrel (MIRENA) 20 MCG/DAY IUD 1 each by Intrauterine route once.   potassium chloride (KLOR-CON M) 10 MEQ tablet Take 1 tablet (10 mEq total) by mouth daily.   sulfamethoxazole-trimethoprim (BACTRIM DS) 800-160 MG tablet Take 1 tablet by mouth 2 (two) times daily for 7 days.   No facility-administered encounter medications on file as of 11/25/2022.    Past Medical History:  Diagnosis Date   Asthma    Atrial fibrillation (HCC) 2021   Endometriosis, uterus    Fractured pelvis (HCC)    Headache    Hyperemesis affecting pregnancy, antepartum    Hypertension    Medical history non-contributory    Urticaria     Past Surgical History:  Procedure Laterality Date   ABLATION ON ENDOMETRIOSIS  11/08/2014   Procedure: ABLATION ON ENDOMETRIOSIS;  Surgeon: Geryl Rankins, MD;  Location: [facility];  Service: Gynecology;;   CARDIOVERSION N/A 10/13/2019   Procedure: CARDIOVERSION;  Surgeon: Chrystie Nose, MD;  Location[facility] ENDOSCOPY;  Service: Cardiovascular;  Laterality: N/A;   CHROMOPERTUBATION  11/08/2014   Procedure: CHROMOPERTUBATION;  Surgeon: Geryl Rankins, MD;  Location:  [facility];  Service: Gynecology;;   FOOT SURGERY     LAPAROSCOPY N/A 11/08/2014   Procedure: LAPAROSCOPY DIAGNOSTIC with peritoneal  biopsy;  Surgeon: Geryl Rankins, MD;  Location: [facility];  Service: Gynecology;  Laterality: N/A;   TEE WITHOUT CARDIOVERSION N/A 10/13/2019   Procedure: TRANSESOPHAGEAL ECHOCARDIOGRAM (TEE);  Surgeon: Chrystie Nose, MD;  Location: [facility] ENDOSCOPY;  Service: Cardiovascular;  Laterality: N/A;    Family History  Problem Relation Age of Onset   Healthy Mother    Healthy Father     Social History   Socioeconomic History   Marital status: Single    Spouse  name: Not on file   Number of children: Not on file   Years of education: Not on file   Highest education level: Not on file  Occupational History   Not on file  Tobacco Use   Smoking status: Former    Types: Cigars    Passive exposure: Yes   Smokeless tobacco: Never   Tobacco comments:    boyfriend smokes inside his home  Vaping Use   Vaping status: Former   Substances: Nicotine, THC, CBD, Flavoring  Substance and Sexual Activity   Alcohol use: Not Currently    Comment: occ   Drug use: No   Sexual activity: Yes  Other Topics Concern   Not on file  Social History Narrative   Not on file   Social Determinants of Health   Financial Resource Strain: Not on file  Food Insecurity: Not on file  Transportation Needs: Not on file  Physical Activity: Not on file  Stress: Not on file  Social Connections: Not on file  Intimate Partner Violence: Not on file    Review of Systems  Constitutional: Negative.   HENT: Negative.    Eyes:  Negative for blurred vision, discharge and redness.  Respiratory: Negative.    Cardiovascular: Negative.   Gastrointestinal:  Negative for abdominal pain.  Genitourinary: Negative.   Musculoskeletal: Negative.  Negative for myalgias.  Skin:  Negative for rash.  Neurological:  Positive for headaches. Negative for tingling, loss of consciousness and weakness.  Endo/Heme/Allergies:  Negative for polydipsia.        Objective    BP 124/84   Pulse (!) 113   Temp (!) 97.4 F (36.3 C)   Ht 5\' 4"  (1.626 m)   Wt 178 lb 3.2 oz (80.8 kg)   LMP 11/11/2022   SpO2 100%   Breastfeeding No   BMI 30.59 kg/m   Physical Exam Constitutional:      General: She is not in acute distress.    Appearance: Normal appearance. She is not ill-appearing, toxic-appearing or diaphoretic.  HENT:     Head: Normocephalic and atraumatic.     Right Ear: External ear normal.     Left Ear: External ear normal.     Mouth/Throat:     Mouth: Mucous membranes are  moist.     Pharynx: Oropharynx is clear. No oropharyngeal exudate or posterior oropharyngeal erythema.  Eyes:     General: No scleral icterus.       Right eye: No discharge.        Left eye: No discharge.     Extraocular Movements: Extraocular movements intact.     Conjunctiva/sclera: Conjunctivae normal.     Pupils: Pupils are equal, round, and reactive to light.  Neck:     Thyroid: Thyroid mass, thyromegaly and thyroid tenderness present.     Comments: Thyroid is enlarged on the right  side.  Tender. Cardiovascular:     Rate and Rhythm: Normal rate and regular rhythm.  Pulmonary:     Effort: Pulmonary effort is normal. No respiratory distress.     Breath sounds: Normal breath sounds.  Abdominal:     General: Bowel sounds are normal.  Musculoskeletal:     Cervical back: No rigidity or tenderness.  Skin:    General: Skin is warm and dry.  Neurological:     Mental Status: She is alert and oriented to person, place, and time.  Psychiatric:        Mood and Affect: Mood normal.        Behavior: Behavior normal.         Assessment & Plan:   Healthcare maintenance -     CBC -     Comprehensive metabolic panel -     Hemoglobin A1c -     Urinalysis, Routine w reflex microscopic  Thyroid nodule greater than or equal to 1.5 cm in diameter incidentally noted on imaging study -     TSH -     T4, free -     Thyroid stimulating immunoglobulin -     [place] FNA BX THYROID 1ST LESION AFIRMA; Future  Low TSH level -     TSH -     T4, free -     Thyroid stimulating immunoglobulin  Migraine without aura and without status migrainosus, not intractable -     Ambulatory referral to Neurology  Screening for hyperlipidemia -     Lipid panel  Urinary tract infection without hematuria, site unspecified -     Sulfamethoxazole-Trimethoprim; Take 1 tablet by mouth 2 (two) times daily for 7 days.  Dispense: 14 tablet; Refill: 0     Return in about 3 months (around 02/25/2023).  Here for  establishment  Continue follow-up with cardiology for treatment of hypertension.  Will refer to nephrology for headache management.  Explained that medications will probably be changed.  Sending for thyroid biopsy.  She has symptoms of hyperthyroidism.  Will check thyroid-stimulating blading antibodies.  Encouraged her to continue exercising.  Information was given on health maintenance and disease prevention. Mliss Sax, MD

## 2022-11-26 ENCOUNTER — Encounter: Payer: Self-pay | Admitting: Family Medicine

## 2022-11-26 DIAGNOSIS — R8290 Unspecified abnormal findings in urine: Secondary | ICD-10-CM

## 2022-11-27 DIAGNOSIS — E041 Nontoxic single thyroid nodule: Secondary | ICD-10-CM | POA: Insufficient documentation

## 2022-11-27 DIAGNOSIS — R7989 Other specified abnormal findings of blood chemistry: Secondary | ICD-10-CM | POA: Insufficient documentation

## 2022-11-27 DIAGNOSIS — N390 Urinary tract infection, site not specified: Secondary | ICD-10-CM | POA: Insufficient documentation

## 2022-11-27 DIAGNOSIS — G43009 Migraine without aura, not intractable, without status migrainosus: Secondary | ICD-10-CM | POA: Insufficient documentation

## 2022-11-27 DIAGNOSIS — Z0000 Encounter for general adult medical examination without abnormal findings: Secondary | ICD-10-CM | POA: Insufficient documentation

## 2022-11-27 MED ORDER — SULFAMETHOXAZOLE-TRIMETHOPRIM 800-160 MG PO TABS: 1.0000 | ORAL_TABLET | Freq: Two times a day (BID) | ORAL | 0 refills | Status: AC

## 2022-11-27 NOTE — Addendum Note (Signed)
Addended by: Andrez Grime on: 11/27/2022 06:02 PM   Modules accepted: Orders

## 2022-11-28 IMAGING — US US EXTREM LOW VENOUS*R*
1 series · 14 of 23 positions shown · non-contrast
Comparison: None Available.

CLINICAL DATA: Right foot swelling

EXAM:
RIGHT LOWER EXTREMITY VENOUS DOPPLER ULTRASOUND
TECHNIQUE: Gray-scale sonography with compression, as well as color and duplex
ultrasound, were performed to evaluate the deep venous system(s)
from the level of the common femoral vein through the popliteal and
proximal calf veins.

[Series 1: us extrem low venous*right* · 0.07mm/px · 22 acquisitions, 14 frames shown]
[im 1/22]
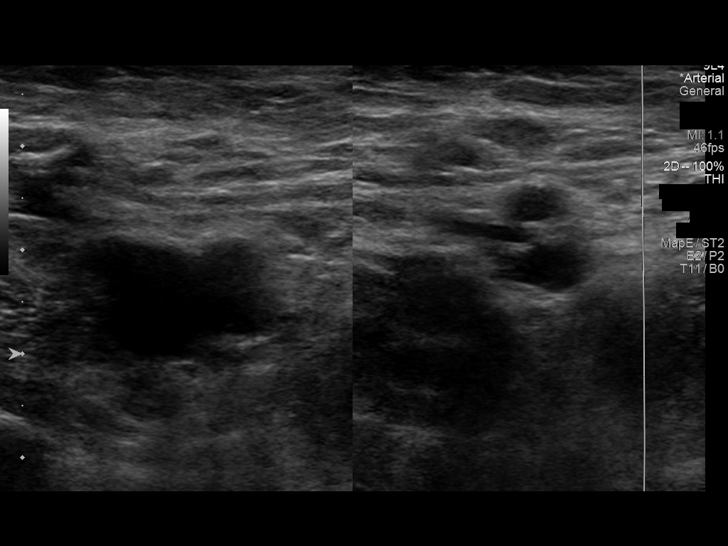
[im 3/22]
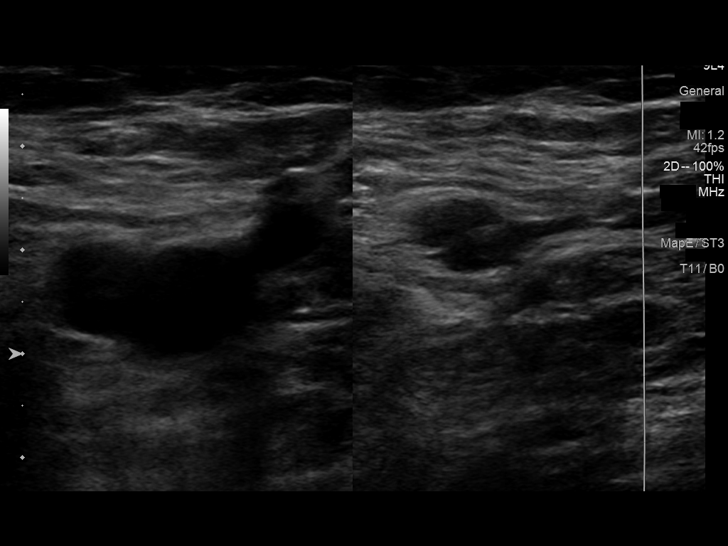
[im 5/22]
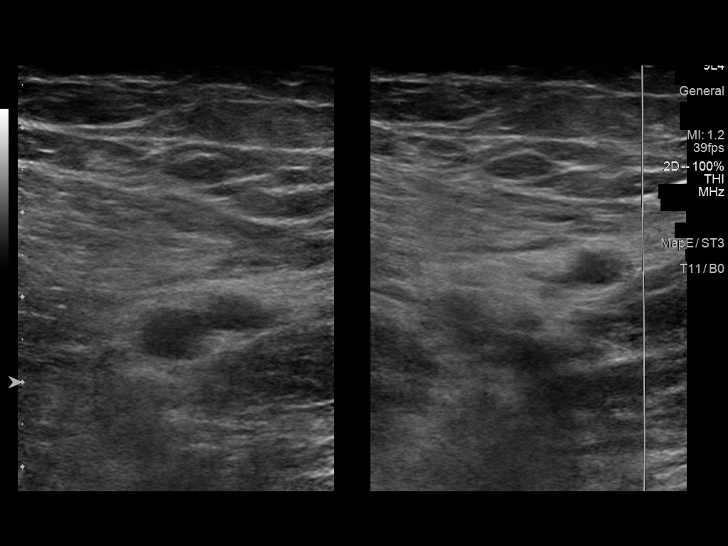
[im 6/22]
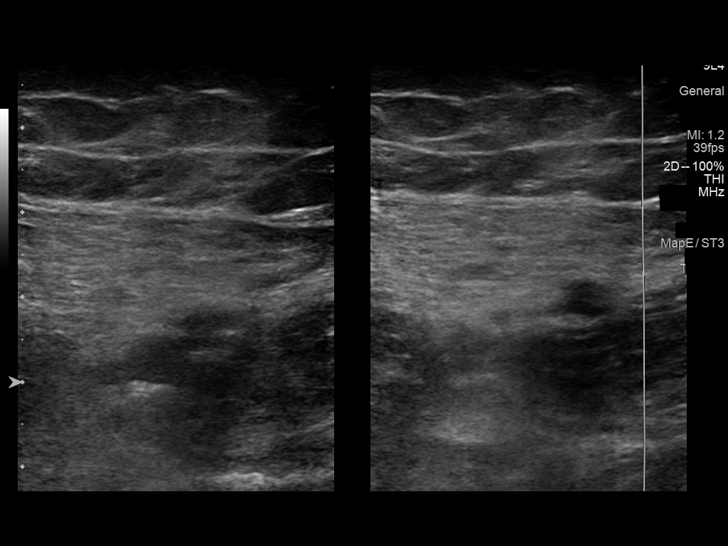
[im 8/22]
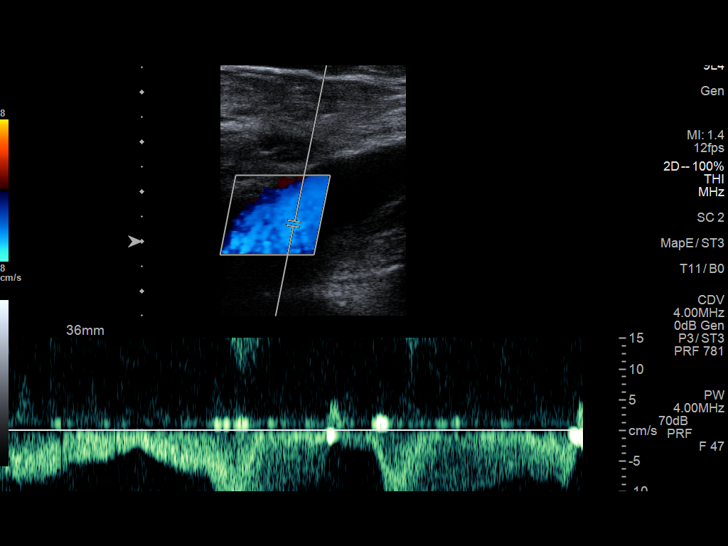
[im 10/22]
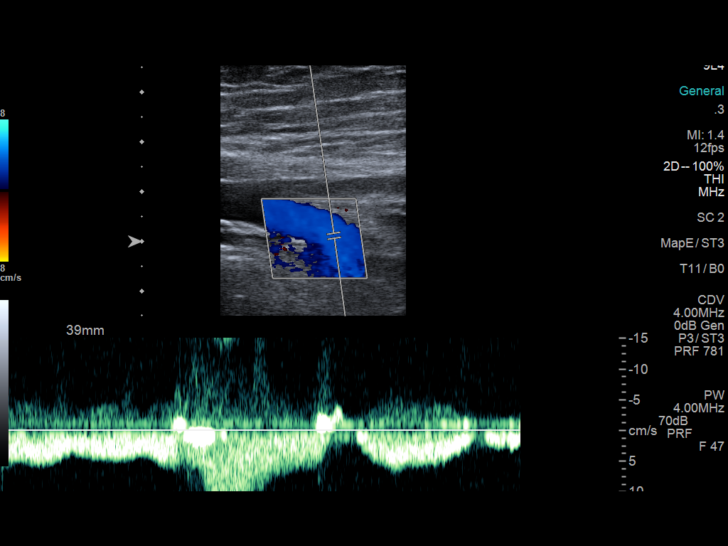
[im 11/22]
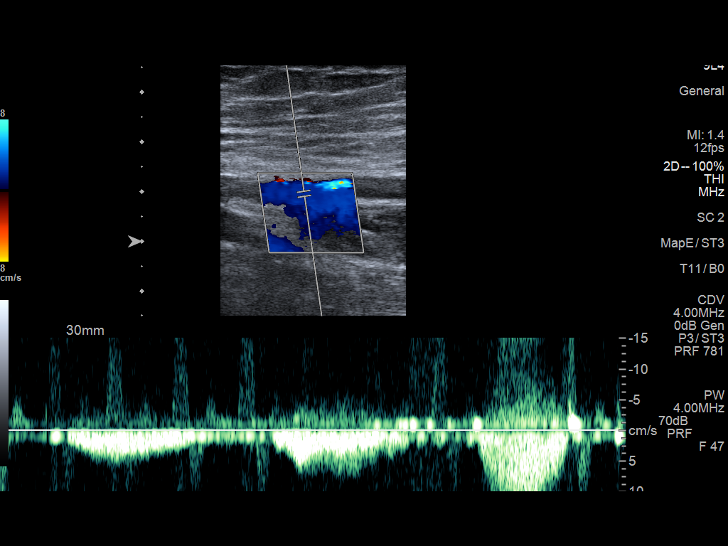
[im 13/22]
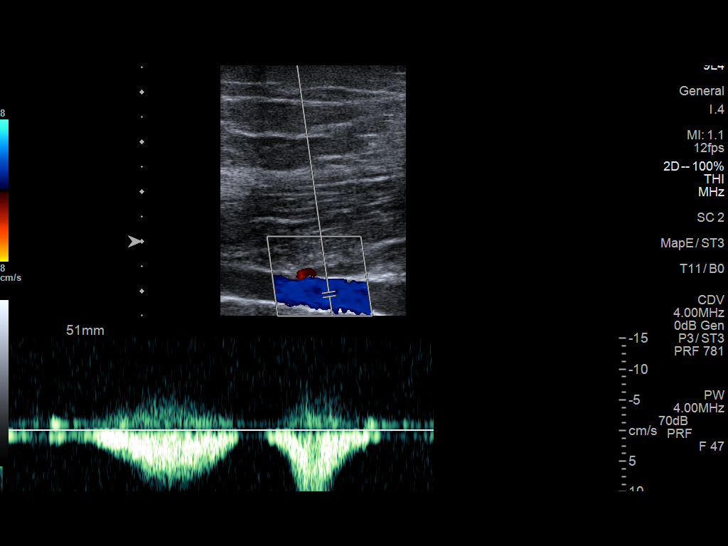
[im 14/22]
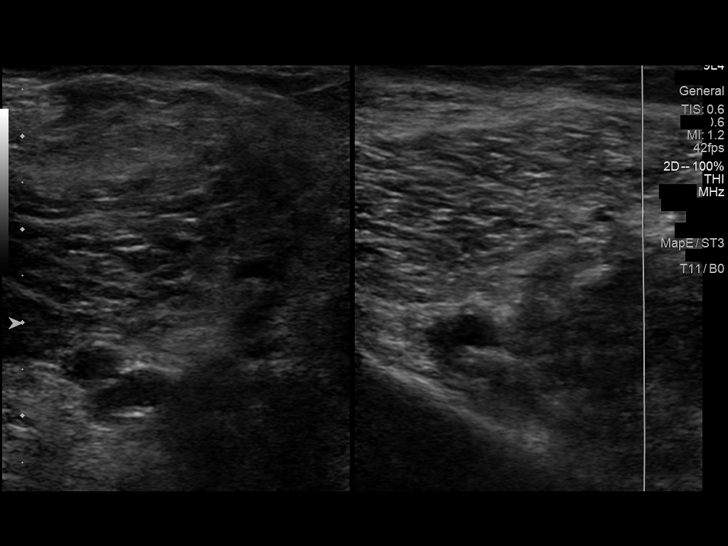
[im 16/22]
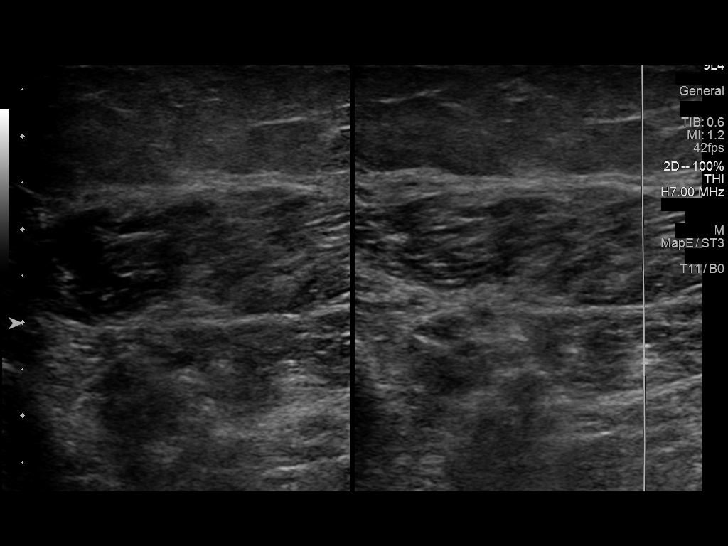
[im 18/22]
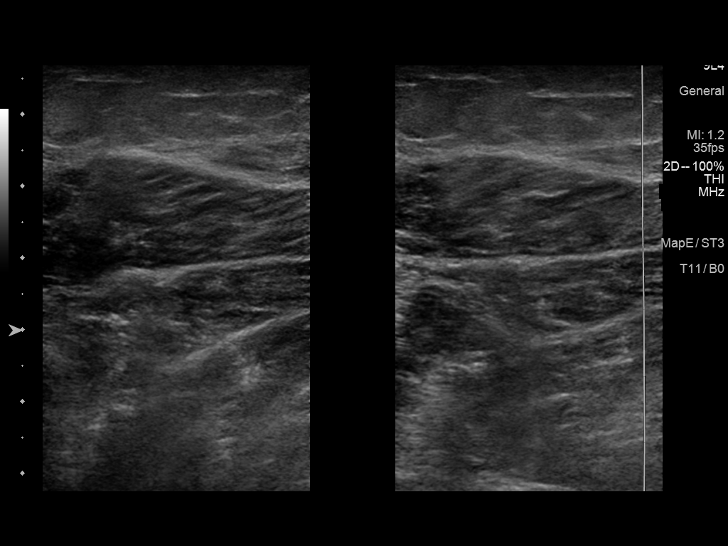
[im 19/22]
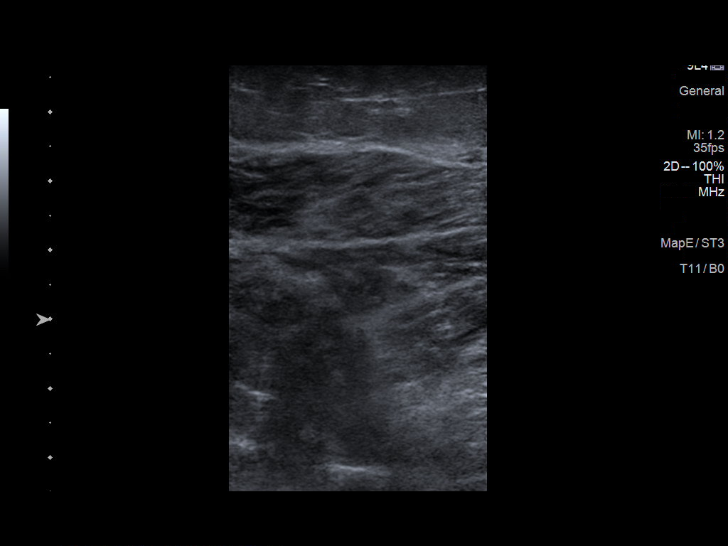
[im 20/22]
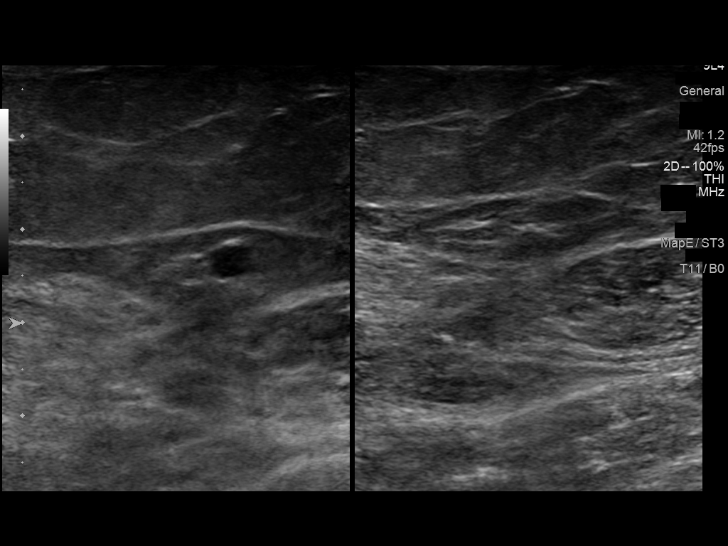
[im 22/22]
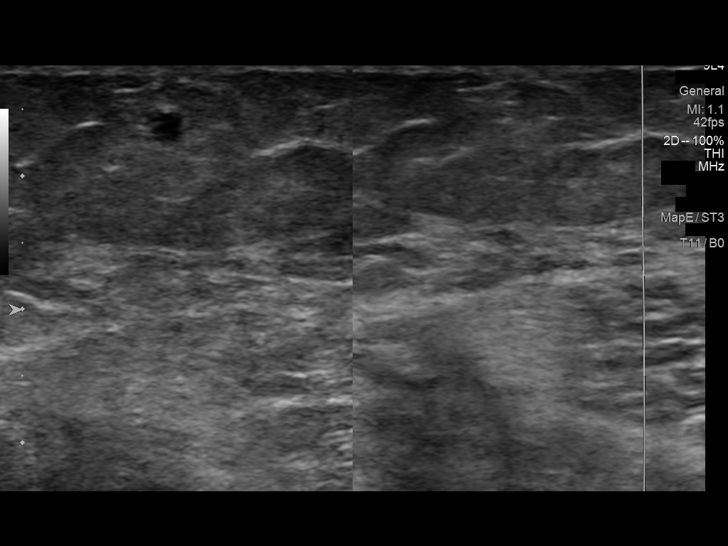

[14 of 23 positions shown; findings below may reference images not displayed]

FINDINGS: VENOUS

Normal compressibility of the common femoral, superficial femoral,
and popliteal veins, as well as the visualized calf veins.
Visualized portions of profunda femoral vein and great saphenous
vein unremarkable. No filling defects to suggest DVT on grayscale or
color Doppler imaging. Doppler waveforms show normal direction of
venous flow, normal respiratory plasticity and response to
augmentation.

Limited views of the contralateral common femoral vein are
unremarkable.

OTHER

None.

Limitations: none
IMPRESSION: Negative.

## 2022-12-21 ENCOUNTER — Encounter: Payer: Self-pay | Admitting: Neurology

## 2023-01-07 ENCOUNTER — Ambulatory Visit
Admission: RE | Admit: 2023-01-07 | Discharge: 2023-01-07 | Disposition: A | Discharge status: Home / Self Care | Payer: Medicaid Other | Source: Ambulatory Visit | Attending: Family Medicine | Admitting: Family Medicine

## 2023-01-07 ENCOUNTER — Other Ambulatory Visit (HOSPITAL_COMMUNITY)
Admission: RE | Admit: 2023-01-07 | Discharge: 2023-01-07 | Disposition: A | Discharge status: Home / Self Care | Payer: Medicaid Other | Source: Ambulatory Visit | Attending: Interventional Radiology | Admitting: Interventional Radiology

## 2023-01-07 DIAGNOSIS — E041 Nontoxic single thyroid nodule: Secondary | ICD-10-CM | POA: Insufficient documentation

## 2023-01-08 ENCOUNTER — Telehealth: Payer: Self-pay | Admitting: Family Medicine

## 2023-01-08 NOTE — Telephone Encounter (Signed)
Pt would like to talk about the results from her thyroid test. She is a little anxious.

## 2023-01-08 NOTE — Telephone Encounter (Signed)
Pt is wanting her most recent lab results, please advise pt at [phone]

## 2023-01-11 ENCOUNTER — Encounter: Payer: Self-pay | Admitting: Family Medicine

## 2023-01-11 LAB — CYTOLOGY - NON PAP

## 2023-03-15 NOTE — Progress Notes (Unsigned)
NEUROLOGY CONSULTATION NOTE  Joyce Franco MRN: [id number] DOB: 03/18/1996  Referring provider: Nadene Rubins, MD Primary care provider: Nadene Rubins, MD  Reason for consult:  migraines  Assessment/Plan:   Migraine with aura, without status migrainosus, not intractable  Migraine prevention:  Plan to start Ajovy.  Check pregnancy test.  Advised not to get pregnant while on medication.  She has already tried a beta blocker.  I would avoid a tricyclic antidepressant and Aimovig due to having hypertension requiring 3 antihypertensive medications for adequate control.  I would avoid topiramate as I do not want to alter her cognition as she is a mother of a 27 year old and 32 year old.   Migraine rescue:  Stop Fioricet.  Start rizatriptan 10mg , take Zofran for nausea Limit use of pain relievers to no more than 2 days out of week to prevent risk of rebound or medication-overuse headache. Keep headache diary Follow up 5 months.    Subjective:  Joyce Franco is a 27 year old right-handed female with a fib, HTN, asthma and endometriosis who presents for migraines.  History supplemented by referring provider's note.  Onset:  since she was young but more severe since 27 years old. Location:  left frontal Quality:  pressure Intensity:  Severe Aura:  flashes, small balls of light in vision, double vision (before, during and sometimes after end of headache) Prodrome:  absent Associated symptoms:  Nausea, photophobia, phonophobia, sometimes vomiting.  She denies associated unilateral numbness or weakness. Duration:  4 to 6 hours Frequency:  once a week Frequency of abortive medication: Fioricet once a week Triggers:  unknown Relieving factors:  shower, dark Activity:  aggravates  Remote CT head on 12/23/2011 personally reviewed was normal.    Past NSAIDS/analgesics:  tramadol, naproxen, ketorolac, Tylenol Past abortive triptans:  none Past abortive ergotamine:  none Past muscle  relaxants:  tizanidine Past anti-emetic:  Reglan, Phenergan, Zofran Past antihypertensive medications:  metoprolol Past antidepressant medications:  none Past anticonvulsant medications:  none Past anti-CGRP:  none Past vitamins/Herbal/Supplements:  none Past antihistamines/decongestants:  scopolamine, Flonase, Benadryl Other past therapies:  none  Current NSAIDS/analgesics:  Fioricet Current triptans:  none Current ergotamine:  none Current anti-emetic:  none Current muscle relaxants:  Flexeril 10mg  TID Current Antihypertensive medications:  labetolol, diltiazem, HCTZ Current Antidepressant medications:  none Current Anticonvulsant medications:  none Current anti-CGRP:  none Current Vitamins/Herbal/Supplements:  KCl Current Antihistamines/Decongestants:  none Other therapy:  none Birth control:  Mirena   Caffeine:  Soda and tea daily.  No coffee Diet:  five 16 oz bottles of water daily.  Sometimes skips meals Exercise:  walks around a football field at work Depression:  no; Anxiety:  yes Sleep hygiene:  no problems sleeping.  Sometimes woken up due to her child Family history of headache:  mom      PAST MEDICAL HISTORY: Past Medical History:  Diagnosis Date   Asthma    Atrial fibrillation (HCC) 2021   Endometriosis, uterus    Fractured pelvis (HCC)    Headache    Hyperemesis affecting pregnancy, antepartum    Hypertension    Medical history non-contributory    Urticaria     PAST SURGICAL HISTORY: Past Surgical History:  Procedure Laterality Date   ABLATION ON ENDOMETRIOSIS  11/08/2014   Procedure: ABLATION ON ENDOMETRIOSIS;  Surgeon: Geryl Rankins, MD;  Location: [facility];  Service: Gynecology;;   CARDIOVERSION N/A 10/13/2019   Procedure: CARDIOVERSION;  Surgeon: Chrystie Nose, MD;  Location: [facility]  ENDOSCOPY;  Service: Cardiovascular;  Laterality: N/A;   CHROMOPERTUBATION  11/08/2014   Procedure: CHROMOPERTUBATION;  Surgeon: Geryl Rankins, MD;  Location: [facility];   Service: Gynecology;;   FOOT SURGERY     LAPAROSCOPY N/A 11/08/2014   Procedure: LAPAROSCOPY DIAGNOSTIC with peritoneal  biopsy;  Surgeon: Geryl Rankins, MD;  Location: [facility];  Service: Gynecology;  Laterality: N/A;   TEE WITHOUT CARDIOVERSION N/A 10/13/2019   Procedure: TRANSESOPHAGEAL ECHOCARDIOGRAM (TEE);  Surgeon: Chrystie Nose, MD;  Location: [facility] ENDOSCOPY;  Service: Cardiovascular;  Laterality: N/A;    MEDICATIONS: Current Outpatient Medications on File Prior to Visit  Medication Sig Dispense Refill   acetaminophen (TYLENOL) 325 MG tablet Take 2 tablets (650 mg total) by mouth every 6 (six) hours as needed for moderate pain or mild pain (for pain scale < 4  OR  temperature  >/=  100.5 F). 60 tablet 0   Butalbital-APAP-Caffeine 50-300-40 MG CAPS Take 1 capsule by mouth every 6 (six) hours as needed.     cyclobenzaprine (FLEXERIL) 10 MG tablet Take 10 mg by mouth 3 (three) times daily.     diltiazem (CARDIZEM CD) 240 MG 24 hr capsule Take 1 capsule (240 mg total) by mouth daily. 90 capsule 1   hydrochlorothiazide (HYDRODIURIL) 25 MG tablet Take 1 tablet (25 mg total) by mouth daily. 90 tablet 3   labetalol (NORMODYNE) 300 MG tablet Take 1 tablet (300 mg total) by mouth 2 (two) times daily. 180 tablet 1   levonorgestrel (MIRENA) 20 MCG/DAY IUD 1 each by Intrauterine route once.     potassium chloride (KLOR-CON M) 10 MEQ tablet Take 1 tablet (10 mEq total) by mouth daily. 90 tablet 3   No current facility-administered medications on file prior to visit.    ALLERGIES: Allergies  Allergen Reactions   Contrast Media [Iodinated Contrast Media] Hives and Other (See Comments)    Warm feeling in back of throat 10 minutes after contrast   Ibuprofen Nausea And Vomiting    FAMILY HISTORY: Family History  Problem Relation Age of Onset   Healthy Mother    Healthy Father     Objective:  Blood pressure 120/70, pulse 95, height 5\' 4"  (1.626 m), weight 168 lb (76.2 kg), SpO2 99%, not  currently breastfeeding. General: No acute distress.  Patient appears well-groomed.   Head:  Normocephalic/atraumatic Eyes:  fundi examined but not visualized Neck: supple, no paraspinal tenderness, full range of motion Heart: regular rate and rhythm Neurological Exam: Mental status: alert and oriented to person, place, and time, speech fluent and not dysarthric, language intact. Cranial nerves: CN I: not tested CN II: pupils equal, round and reactive to light, visual fields intact CN III, IV, VI:  full range of motion, no nystagmus, no ptosis CN V: facial sensation intact. CN VII: upper and lower face symmetric CN VIII: hearing intact CN IX, X: gag intact, uvula midline CN XI: sternocleidomastoid and trapezius muscles intact CN XII: tongue midline Bulk & Tone: normal, no fasciculations. Motor:  muscle strength 5/5 throughout Sensation:  temperature and vibratory sensation intact. Deep Tendon Reflexes:  2+ throughout,  toes downgoing.   Finger to nose testing:  Without dysmetria.   Gait:  Normal station and stride.  Romberg negative.    Thank you for allowing me to take part in the care of this patient.  Shon Millet, DO  CC: Nadene Rubins, MD

## 2023-03-16 ENCOUNTER — Encounter: Payer: Self-pay | Admitting: Neurology

## 2023-03-16 ENCOUNTER — Other Ambulatory Visit (INDEPENDENT_AMBULATORY_CARE_PROVIDER_SITE_OTHER): Payer: Medicaid Other

## 2023-03-16 ENCOUNTER — Telehealth: Payer: Self-pay

## 2023-03-16 ENCOUNTER — Ambulatory Visit: Payer: Medicaid Other | Admitting: Neurology

## 2023-03-16 VITALS — BP 120/70 | HR 95 | Ht 64.0 in | Wt 168.0 lb

## 2023-03-16 DIAGNOSIS — Z79899 Other long term (current) drug therapy: Secondary | ICD-10-CM

## 2023-03-16 MED ORDER — AJOVY 225 MG/1.5ML ~~LOC~~ SOAJ: 225.0000 mg | SUBCUTANEOUS | 11 refills | Status: DC

## 2023-03-16 MED ORDER — RIZATRIPTAN BENZOATE 10 MG PO TABS: 10.0000 mg | ORAL_TABLET | ORAL | 5 refills | Status: DC | PRN

## 2023-03-16 NOTE — Patient Instructions (Addendum)
  Start Ajovy injection every 28 days.  Will check pregnancy test. Take rizatriptan 10mg  at earliest onset of headache.  May repeat dose once in 2 hours if needed.  Maximum 2 tablets in 24 hours.  STOP FIORICET Also take ondansetron/Zofran at earliest onset of migraine as well Limit use of pain relievers to no more than 2 days out of the week.  These medications include acetaminophen, NSAIDs (ibuprofen/Advil/Motrin, naproxen/Aleve, triptans (Imitrex/sumatriptan), Excedrin, and narcotics.  This will help reduce risk of rebound headaches. Be aware of common food triggers Routine exercise Stay adequately hydrated (aim for 64 oz water daily) Keep headache diary Maintain proper stress management Maintain proper sleep hygiene Do not skip meals Consider supplements:  magnesium citrate 400mg  daily, riboflavin 400mg  daily, coenzyme Q10  300mg  daily.

## 2023-03-17 ENCOUNTER — Telehealth: Payer: Self-pay

## 2023-03-17 ENCOUNTER — Encounter: Payer: Self-pay | Admitting: Neurology

## 2023-03-17 LAB — HCG, QUANTITATIVE, PREGNANCY: Quantitative HCG: <0.6 m[IU]/mL

## 2023-03-17 NOTE — Telephone Encounter (Signed)
PA needed for Ajovy.

## 2023-03-19 ENCOUNTER — Telehealth: Payer: Self-pay

## 2023-03-19 ENCOUNTER — Other Ambulatory Visit (HOSPITAL_COMMUNITY): Payer: Self-pay

## 2023-03-19 NOTE — Telephone Encounter (Signed)
*  [facility]  Pharmacy Patient Advocate Encounter  Received notification from [facility] that Prior Authorization for AJOVY (fremanezumab-vfrm) injection 225MG /1.5ML auto-injectors  has been APPROVED from 03/19/2023 to 06/16/2023   PA #/Case ID/Reference #: [id number]

## 2023-03-19 NOTE — Telephone Encounter (Signed)
PA request has been Submitted. New Encounter created for follow up. For additional info see Pharmacy Prior Auth telephone encounter from 11/15.

## 2023-07-22 ENCOUNTER — Other Ambulatory Visit (HOSPITAL_COMMUNITY): Payer: Self-pay

## 2023-07-23 ENCOUNTER — Other Ambulatory Visit (HOSPITAL_COMMUNITY): Payer: Self-pay

## 2023-07-23 ENCOUNTER — Telehealth: Payer: Self-pay | Admitting: Pharmacy Technician

## 2023-07-23 NOTE — Telephone Encounter (Signed)
 Pharmacy Patient Advocate Encounter  Received notification from [facility] that Prior Authorization for AJOVY (fremanezumab-vfrm) injection 225MG /1.5ML auto-injectors has been APPROVED from 07/23/2023 to 07/22/2024   PA #/Case ID/Reference #: [id number], Key: [id number]

## 2023-07-26 ENCOUNTER — Other Ambulatory Visit (HOSPITAL_COMMUNITY): Payer: Self-pay

## 2023-08-18 ENCOUNTER — Ambulatory Visit: Payer: Medicaid Other | Admitting: Neurology

## 2023-08-25 NOTE — Progress Notes (Unsigned)
 NEUROLOGY FOLLOW UP OFFICE NOTE  Joyce Franco [id number]  Assessment/Plan:   Migraine with aura, without status migrainosus, not intractable  Migraine prevention:  Ajovy  *** Migraine rescue:  Rizatriptan  10mg , take Zofran  for nausea *** Limit use of pain relievers to no more than 2 days out of week to prevent risk of rebound or medication-overuse headache. Keep headache diary Follow up 6 months.    Subjective:  Joyce Franco is a 28 year old right-handed female with a fib, HTN, asthma and endometriosis who follows up for migraines.  UPDATE: Started Ajovy  Intensity:  *** Duration:  *** with rizatriptan  Frequency:  *** Frequency of abortive medication: *** Current NSAIDS/analgesics:  none Current triptans:  rizatriptan  10mg  Current ergotamine:  none Current anti-emetic:  Zofran  4mg  Current muscle relaxants:  Flexeril  10mg  TID Current Antihypertensive medications:  labetolol, diltiazem , HCTZ Current Antidepressant medications:  none Current Anticonvulsant medications:  none Current anti-CGRP:  Ajovy  Current Vitamins/Herbal/Supplements:  KCl Current Antihistamines/Decongestants:  none Other therapy:  none Birth control:  Mirena   Caffeine :  Soda and tea daily.  No coffee Diet:  five 16 oz bottles of water daily.  Sometimes skips meals Exercise:  walks around a football field at work Depression:  no; Anxiety:  yes Sleep hygiene:  no problems sleeping.  Sometimes woken up due to her child  HISTORY: Onset:  since she was young but more severe since 28 years old. Location:  left frontal Quality:  pressure Intensity:  Severe Aura:  flashes, small balls of light in vision, double vision (before, during and sometimes after end of headache) Prodrome:  absent Associated symptoms:  Nausea, photophobia, phonophobia, sometimes vomiting.  She denies associated unilateral numbness or weakness. Duration:  4 to 6 hours Frequency:  once a week Frequency of abortive medication:  Fioricet  once a week Triggers:  unknown Relieving factors:  shower, dark Activity:  aggravates  Remote CT head on 12/23/2011 personally reviewed was normal.    Past NSAIDS/analgesics:  tramadol , naproxen , ketorolac , Tylenol , Fioricet  Past abortive triptans:  none Past abortive ergotamine:  none Past muscle relaxants:  tizanidine Past anti-emetic:  Reglan , Phenergan , Zofran  Past antihypertensive medications:  metoprolol  Past antidepressant medications:  none Past anticonvulsant medications:  none Past anti-CGRP:  none Past vitamins/Herbal/Supplements:  none Past antihistamines/decongestants:  scopolamine , Flonase, Benadryl  Other past therapies:  none   Family history of headache:  mom  PAST MEDICAL HISTORY: Past Medical History:  Diagnosis Date   Asthma    Atrial fibrillation (HCC) 2021   Endometriosis, uterus    Fractured pelvis (HCC)    Headache    Hyperemesis affecting pregnancy, antepartum    Hypertension    Medical history non-contributory    Urticaria     MEDICATIONS: Current Outpatient Medications on File Prior to Visit  Medication Sig Dispense Refill   acetaminophen  (TYLENOL ) 325 MG tablet Take 2 tablets (650 mg total) by mouth every 6 (six) hours as needed for moderate pain or mild pain (for pain scale < 4  OR  temperature  >/=  100.5 F). (Patient not taking: Reported on 03/16/2023) 60 tablet 0   cyclobenzaprine  (FLEXERIL ) 10 MG tablet Take 10 mg by mouth 3 (three) times daily as needed.     diltiazem  (CARDIZEM  CD) 240 MG 24 hr capsule Take 1 capsule (240 mg total) by mouth daily. 90 capsule 1   Fremanezumab -vfrm (AJOVY ) 225 MG/1.5ML SOAJ Inject 225 mg into the skin every 28 (twenty-eight) days. 1.68 mL 11   hydrochlorothiazide  (HYDRODIURIL ) 25 MG tablet  Take 1 tablet (25 mg total) by mouth daily. 90 tablet 3   labetalol  (NORMODYNE ) 300 MG tablet Take 1 tablet (300 mg total) by mouth 2 (two) times daily. 180 tablet 1   levonorgestrel (MIRENA) 20 MCG/DAY IUD 1 each  by Intrauterine route once.     potassium chloride  (KLOR-CON  M) 10 MEQ tablet Take 1 tablet (10 mEq total) by mouth daily. 90 tablet 3   rizatriptan  (MAXALT ) 10 MG tablet Take 1 tablet (10 mg total) by mouth as needed for migraine. May repeat in 2 hours if needed.  Maximum 2 tablets in 24 hours. 10 tablet 5   No current facility-administered medications on file prior to visit.    ALLERGIES: Allergies  Allergen Reactions   Contrast Media [Iodinated Contrast Media] Hives and Other (See Comments)    Warm feeling in back of throat 10 minutes after contrast   Ibuprofen Nausea And Vomiting    FAMILY HISTORY: Family History  Problem Relation Age of Onset   Healthy Mother    Healthy Father       Objective:  *** General: No acute distress.  Patient appears ***-groomed.   Head:  Normocephalic/atraumatic Eyes:  Fundi examined but not visualized Neck: supple, no paraspinal tenderness, full range of motion Heart:  Regular rate and rhythm Lungs:  Clear to auscultation bilaterally Back: No paraspinal tenderness Neurological Exam: alert and oriented.  Speech fluent and not dysarthric, language intact.  CN II-XII intact. Bulk and tone normal, muscle strength 5/5 throughout.  Sensation to light touch intact.  Deep tendon reflexes 2+ throughout, toes downgoing.  Finger to nose testing intact.  Gait normal, Romberg negative.   Janne Members, DO  CC: ***

## 2023-08-26 ENCOUNTER — Encounter: Payer: Self-pay | Admitting: Neurology

## 2023-08-26 ENCOUNTER — Ambulatory Visit: Admitting: Neurology

## 2023-08-26 VITALS — BP 125/80 | HR 106 | Ht 64.0 in | Wt 164.0 lb

## 2023-08-26 DIAGNOSIS — G43009 Migraine without aura, not intractable, without status migrainosus: Secondary | ICD-10-CM | POA: Diagnosis not present

## 2023-08-26 MED ORDER — AJOVY 225 MG/1.5ML ~~LOC~~ SOAJ: 225.0000 mg | SUBCUTANEOUS | 11 refills | Status: DC

## 2023-08-26 MED ORDER — RIZATRIPTAN BENZOATE 10 MG PO TABS: 10.0000 mg | ORAL_TABLET | ORAL | 5 refills | Status: DC | PRN

## 2023-12-08 ENCOUNTER — Encounter: Payer: Self-pay | Admitting: Family Medicine

## 2024-01-28 ENCOUNTER — Other Ambulatory Visit: Payer: Self-pay | Admitting: Medical Genetics

## 2024-01-28 NOTE — Progress Notes (Deleted)
 NEUROLOGY FOLLOW UP OFFICE NOTE  Joyce Franco [id number]  Assessment/Plan:   Migraine with aura, without status migrainosus, not intractable   Migraine prevention:  Ajovy  Migraine rescue:  rizatriptan  10mg  Lifestyle modification: Limit use of pain relievers to no more than 9 days out of the month to prevent risk of rebound or medication-overuse headache. Diet modification/hydration/caffeine  cessation Routine exercise Sleep hygiene Consider vitamins/supplements:  magnesium  citrate 400mg  daily, riboflavin 400mg  daily, CoQ10 100mg  three times daily Keep headache diary Follow up ***     Subjective:  Joyce Franco is a 28 year old right-handed female with a fib, HTN, asthma and endometriosis who follows up for migraines.  UPDATE: Started Ajovy .  Improved. Intensity:  moderate Duration:  15 minutes with rizatriptan  Frequency:  once every 2 weeks. Frequency of abortive medication: every 2 weeks Current NSAIDS/analgesics:  none Current triptans:  rizatriptan  10mg  Current ergotamine:  none Current anti-emetic:  none Current muscle relaxants:  none Current Antihypertensive medications:  labetolol, diltiazem , HCTZ Current Antidepressant medications:  none Current Anticonvulsant medications:  none Current anti-CGRP:  Ajovy  Current Vitamins/Herbal/Supplements:  KCl Current Antihistamines/Decongestants:  none Other therapy:  none Birth control:  Mirena   Caffeine :  Cut down on caffeine  Diet:  five 16 oz bottles of water daily.  Cut down on fried foods. Exercise:  walks around a football field at work Depression:  no; Anxiety:  yes Sleep hygiene:  no problems sleeping.  Sometimes woken up due to her child  HISTORY: Onset:  since she was young but more severe since 28 years old. Location:  left frontal Quality:  pressure Intensity:  Severe Aura:  flashes, small balls of light in vision, double vision (before, during and sometimes after end of headache) Prodrome:   absent Associated symptoms:  Nausea, photophobia, phonophobia, sometimes vomiting.  She denies associated unilateral numbness or weakness. Duration:  4 to 6 hours Frequency:  once a week Frequency of abortive medication: Fioricet  once a week Triggers:  unknown Relieving factors:  shower, dark Activity:  aggravates  Remote CT head on 12/23/2011 personally reviewed was normal.    Past NSAIDS/analgesics:  tramadol , naproxen , ketorolac , Tylenol , Fioricet  Past abortive triptans:  none Past abortive ergotamine:  none Past muscle relaxants:  tizanidine, Flexeril  Past anti-emetic:  Reglan , Phenergan , Zofran  Past antihypertensive medications:  metoprolol  Past antidepressant medications:  none Past anticonvulsant medications:  none Past anti-CGRP:  none Past vitamins/Herbal/Supplements:  none Past antihistamines/decongestants:  scopolamine , Flonase, Benadryl  Other past therapies:  none   Family history of headache:  mom  PAST MEDICAL HISTORY: Past Medical History:  Diagnosis Date   Asthma    Atrial fibrillation (HCC) 2021   Endometriosis, uterus    Fractured pelvis (HCC)    Headache    Hyperemesis affecting pregnancy, antepartum    Hypertension    Medical history non-contributory    Urticaria     MEDICATIONS: Current Outpatient Medications on File Prior to Visit  Medication Sig Dispense Refill   diltiazem  (CARDIZEM  CD) 240 MG 24 hr capsule Take 1 capsule (240 mg total) by mouth daily. 90 capsule 1   Fremanezumab -vfrm (AJOVY ) 225 MG/1.5ML SOAJ Inject 225 mg into the skin every 28 (twenty-eight) days. 1.68 mL 11   hydrochlorothiazide  (HYDRODIURIL ) 25 MG tablet Take 1 tablet (25 mg total) by mouth daily. 90 tablet 3   labetalol  (NORMODYNE ) 300 MG tablet Take 1 tablet (300 mg total) by mouth 2 (two) times daily. 180 tablet 1   levonorgestrel (MIRENA) 20 MCG/DAY IUD 1 each by Intrauterine  route once.     potassium chloride  (KLOR-CON  M) 10 MEQ tablet Take 1 tablet (10 mEq total) by  mouth daily. 90 tablet 3   rizatriptan  (MAXALT ) 10 MG tablet Take 1 tablet (10 mg total) by mouth as needed for migraine. May repeat in 2 hours if needed.  Maximum 2 tablets in 24 hours. 10 tablet 5   No current facility-administered medications on file prior to visit.    ALLERGIES: Allergies  Allergen Reactions   Contrast Media [Iodinated Contrast Media] Hives and Other (See Comments)    Warm feeling in back of throat 10 minutes after contrast   Ibuprofen Nausea And Vomiting    FAMILY HISTORY: Family History  Problem Relation Age of Onset   Healthy Mother    Healthy Father       Objective:  *** General: No acute distress.  Patient appears well-groomed.   Head:  Normocephalic/atraumatic Neck:  Supple.  No paraspinal tenderness.  Full range of motion. Heart:  Regular rate and rhythm. Neuro:  Alert and oriented.  Speech fluent and not dysarthric.  Language intact.  CN II-XII intact.  Bulk and tone normal.  Muscle strength 5/5 throughout.  Sensation to light touch intact.  Deep tendon reflexes 2+ throughout, toes downgoing.  Gait normal.  Romberg negative.   Juliene Dunnings, DO  CC: Elsie Sim Lent, MD

## 2024-01-31 ENCOUNTER — Ambulatory Visit: Admitting: Neurology

## 2024-01-31 NOTE — Progress Notes (Unsigned)
 Virtual Visit via Video Note  Consent was obtained for video visit:  {yes no:314532} Answered questions that patient had about telehealth interaction:  {yes no:314532} I discussed the limitations, risks, security and privacy concerns of performing an evaluation and management service by telemedicine. I also discussed with the patient that there may be a patient responsible charge related to this service. The patient expressed understanding and agreed to proceed.  Pt location: Home Physician Location: office Name of referring provider:  Berneta Elsie Sayre,* I connected with Joyce Franco at patients initiation/request on 02/01/2024 at  2:10 PM EDT by video enabled telemedicine application and verified that I am speaking with the correct person using two identifiers. Pt MRN:  [id number] Pt DOB:  Nov 26, 1995 Video Participants:  Joyce Franco;    Assessment/Plan:   Migraine with aura, without status migrainosus, not intractable   Migraine prevention:  Ajovy  Migraine rescue:  rizatriptan  10mg  Lifestyle modification: Limit use of pain relievers to no more than 9 days out of the month to prevent risk of rebound or medication-overuse headache. Diet modification/hydration/caffeine  cessation Routine exercise Sleep hygiene Consider vitamins/supplements:  magnesium  citrate 400mg  daily, riboflavin 400mg  daily, CoQ10 100mg  three times daily Keep headache diary Follow up ***     Subjective:  Joyce Franco is a 28 year old right-handed female with a fib, HTN, asthma and endometriosis who follows up for migraines.  UPDATE: Started Ajovy .  Improved. Intensity:  moderate Duration:  15 minutes with rizatriptan  Frequency:  once every 2 weeks. Frequency of abortive medication: every 2 weeks Current NSAIDS/analgesics:  none Current triptans:  rizatriptan  10mg  Current ergotamine:  none Current anti-emetic:  none Current muscle relaxants:  none Current Antihypertensive medications:   labetolol, diltiazem , HCTZ Current Antidepressant medications:  none Current Anticonvulsant medications:  none Current anti-CGRP:  Ajovy  Current Vitamins/Herbal/Supplements:  KCl Current Antihistamines/Decongestants:  none Other therapy:  none Birth control:  Mirena   Caffeine :  Cut down on caffeine  Diet:  five 16 oz bottles of water daily.  Cut down on fried foods. Exercise:  walks around a football field at work Depression:  no; Anxiety:  yes Sleep hygiene:  no problems sleeping.  Sometimes woken up due to her child  HISTORY: Onset:  since she was young but more severe since 28 years old. Location:  left frontal Quality:  pressure Intensity:  Severe Aura:  flashes, small balls of light in vision, double vision (before, during and sometimes after end of headache) Prodrome:  absent Associated symptoms:  Nausea, photophobia, phonophobia, sometimes vomiting.  She denies associated unilateral numbness or weakness. Duration:  4 to 6 hours Frequency:  once a week Frequency of abortive medication: Fioricet  once a week Triggers:  unknown Relieving factors:  shower, dark Activity:  aggravates  Remote CT head on 12/23/2011 personally reviewed was normal.    Past NSAIDS/analgesics:  tramadol , naproxen , ketorolac , Tylenol , Fioricet  Past abortive triptans:  none Past abortive ergotamine:  none Past muscle relaxants:  tizanidine, Flexeril  Past anti-emetic:  Reglan , Phenergan , Zofran  Past antihypertensive medications:  metoprolol  Past antidepressant medications:  none Past anticonvulsant medications:  none Past anti-CGRP:  none Past vitamins/Herbal/Supplements:  none Past antihistamines/decongestants:  scopolamine , Flonase, Benadryl  Other past therapies:  none   Family history of headache:  mom  Past Medical History: Past Medical History:  Diagnosis Date   Asthma    Atrial fibrillation (HCC) 2021   Endometriosis, uterus    Fractured pelvis (HCC)    Headache    Hyperemesis  affecting pregnancy, antepartum  Hypertension    Medical history non-contributory    Urticaria     Medications: Outpatient Encounter Medications as of 02/01/2024  Medication Sig   diltiazem  (CARDIZEM  CD) 240 MG 24 hr capsule Take 1 capsule (240 mg total) by mouth daily.   Fremanezumab -vfrm (AJOVY ) 225 MG/1.5ML SOAJ Inject 225 mg into the skin every 28 (twenty-eight) days.   hydrochlorothiazide  (HYDRODIURIL ) 25 MG tablet Take 1 tablet (25 mg total) by mouth daily.   labetalol  (NORMODYNE ) 300 MG tablet Take 1 tablet (300 mg total) by mouth 2 (two) times daily.   levonorgestrel (MIRENA) 20 MCG/DAY IUD 1 each by Intrauterine route once.   potassium chloride  (KLOR-CON  M) 10 MEQ tablet Take 1 tablet (10 mEq total) by mouth daily.   rizatriptan  (MAXALT ) 10 MG tablet Take 1 tablet (10 mg total) by mouth as needed for migraine. May repeat in 2 hours if needed.  Maximum 2 tablets in 24 hours.   No facility-administered encounter medications on file as of 02/01/2024.    Allergies: Allergies  Allergen Reactions   Contrast Media [Iodinated Contrast Media] Hives and Other (See Comments)    Warm feeling in back of throat 10 minutes after contrast   Ibuprofen Nausea And Vomiting    Family History: Family History  Problem Relation Age of Onset   Healthy Mother    Healthy Father     Observations/Objective:   *** No acute distress.  Alert and oriented.  Speech fluent and not dysarthric.  Language intact.  Eyes orthophoric on primary gaze.  Face symmetric.   Follow Up Instructions:    -I discussed the assessment and treatment plan with the patient. The patient was provided an opportunity to ask questions and all were answered. The patient agreed with the plan and demonstrated an understanding of the instructions.   The patient was advised to call back or seek an in-person evaluation if the symptoms worsen or if the condition fails to improve as anticipated.   [name], DO

## 2024-02-01 ENCOUNTER — Telehealth (INDEPENDENT_AMBULATORY_CARE_PROVIDER_SITE_OTHER): Admitting: Neurology

## 2024-02-01 ENCOUNTER — Encounter: Payer: Self-pay | Admitting: Neurology

## 2024-02-01 DIAGNOSIS — G43009 Migraine without aura, not intractable, without status migrainosus: Secondary | ICD-10-CM | POA: Diagnosis not present

## 2024-02-01 MED ORDER — RIZATRIPTAN BENZOATE 10 MG PO TABS: 10.0000 mg | ORAL_TABLET | ORAL | 11 refills | Status: AC | PRN

## 2024-02-01 MED ORDER — AJOVY 225 MG/1.5ML ~~LOC~~ SOAJ: 225.0000 mg | SUBCUTANEOUS | 11 refills | Status: AC

## 2024-02-01 NOTE — Patient Instructions (Signed)
 Continue Ajovy  and rizatriptan 

## 2024-02-04 ENCOUNTER — Encounter: Payer: Self-pay | Admitting: Family Medicine

## 2024-02-04 ENCOUNTER — Ambulatory Visit: Admitting: Family Medicine

## 2024-02-04 VITALS — BP 122/78 | HR 99 | Temp 97.6°F | Ht 64.0 in | Wt 165.8 lb

## 2024-02-04 DIAGNOSIS — F341 Dysthymic disorder: Secondary | ICD-10-CM

## 2024-02-04 DIAGNOSIS — R000 Tachycardia, unspecified: Secondary | ICD-10-CM

## 2024-02-04 DIAGNOSIS — F419 Anxiety disorder, unspecified: Secondary | ICD-10-CM | POA: Diagnosis not present

## 2024-02-04 DIAGNOSIS — Z23 Encounter for immunization: Secondary | ICD-10-CM | POA: Diagnosis not present

## 2024-02-04 DIAGNOSIS — I480 Paroxysmal atrial fibrillation: Secondary | ICD-10-CM | POA: Diagnosis not present

## 2024-02-04 DIAGNOSIS — R7989 Other specified abnormal findings of blood chemistry: Secondary | ICD-10-CM

## 2024-02-04 LAB — BASIC METABOLIC PANEL WITH GFR
BUN: 12 mg/dL (ref 6–23)
CO2: 25 meq/L (ref 19–32)
Calcium: 8.7 mg/dL (ref 8.4–10.5)
Chloride: 104 meq/L (ref 96–112)
Creatinine, Ser: 0.8 mg/dL (ref 0.40–1.20)
GFR: 99.28 mL/min (ref >60.00)
Glucose, Bld: 80 mg/dL (ref 70–99)
Potassium: 4.2 meq/L (ref 3.5–5.1)
Sodium: 138 meq/L (ref 135–145)

## 2024-02-04 LAB — CBC
HCT: 40.5 % (ref 36.0–46.0)
Hemoglobin: 13.5 g/dL (ref 12.0–15.0)
MCHC: 33.4 g/dL (ref 30.0–36.0)
MCV: 82.6 fl (ref 78.0–100.0)
Platelets: 299 K/uL (ref 150.0–400.0)
RBC: 4.9 Mil/uL (ref 3.87–5.11)
RDW: 13.5 % (ref 11.5–15.5)
WBC: 6.4 K/uL (ref 4.0–10.5)

## 2024-02-04 LAB — T3, FREE: T3, Free: 3.6 pg/mL (ref 2.3–4.2)

## 2024-02-04 LAB — T4, FREE: Free T4: 0.56 ng/dL — ABNORMAL LOW (ref 0.60–1.60)

## 2024-02-04 LAB — TSH: TSH: 0.97 u[IU]/mL (ref 0.35–5.50)

## 2024-02-04 MED ORDER — ESCITALOPRAM OXALATE 5 MG PO TABS: 5.0000 mg | ORAL_TABLET | Freq: Every day | ORAL | 2 refills | Status: DC

## 2024-02-04 MED ORDER — DILTIAZEM HCL ER COATED BEADS 240 MG PO CP24: 240.0000 mg | ORAL_CAPSULE | Freq: Every day | ORAL | 0 refills | Status: AC

## 2024-02-04 NOTE — Progress Notes (Signed)
 Established Patient Office Visit   Subjective:  Patient ID: Joyce Franco, female    DOB: 1995-05-13  Age: 28 y.o. MRN: [id number]  Chief Complaint  Patient presents with   Fatigue    Pt complains of fatigue, Pt has had a US  of thyroid  and biopsy. Pt complains of a decrease of appetite fro over 6 months.     HPI Encounter Diagnoses  Name Primary?   Low TSH level Yes   Paroxysmal atrial fibrillation (HCC)    Anxiety    Dysthymia    Paroxysmal A-fib (HCC)    Sinus tachycardia    Immunization due    For follow-up of above.  Last seen in July of this past year and asked to return in 3 months.  She now has a healthy 39-year-old.  Keeps her busy.  She is not working outside the home at this time.  She lives with her mom and sister.  History of low TSH and status post normal biopsy of a thyroid  nodule of concern.  History of paroxysmal atrial fib.  Reports diminished appetite with anxiety and some sadness.  She does not smoke or use illicit drugs.  She drinks alcohol on occasion and has 1 or 2 shots.  Seeing GYN.  Has IUD and is currently talking OCPs.   Review of Systems  Constitutional: Negative.   HENT: Negative.    Eyes:  Negative for blurred vision, discharge and redness.  Respiratory: Negative.    Cardiovascular: Negative.   Gastrointestinal:  Negative for abdominal pain.  Genitourinary: Negative.   Musculoskeletal: Negative.  Negative for myalgias.  Skin:  Negative for rash.  Neurological:  Negative for tingling, loss of consciousness and weakness.  Endo/Heme/Allergies:  Negative for polydipsia.      02/04/2024    9:38 AM 11/25/2022    8:33 AM 11/25/2022    8:15 AM  Depression screen PHQ 2/9  Decreased Interest 1 0 0  Down, Depressed, Hopeless 0 0 0  PHQ - 2 Score 1 0 0  Altered sleeping 0 1   Tired, decreased energy 2 1   Change in appetite 2 1   Feeling bad or failure about yourself  0 0   Trouble concentrating 0 0   Moving slowly or fidgety/restless 0 0    Suicidal thoughts 0 0   PHQ-9 Score 5 3   Difficult doing work/chores Somewhat difficult Somewhat difficult       Current Outpatient Medications:    escitalopram (LEXAPRO) 5 MG tablet, Take 1 tablet (5 mg total) by mouth daily., Disp: 30 tablet, Rfl: 2   ESTARYLLA 0.25-35 MG-MCG tablet, Take 1 tablet by mouth daily., Disp: , Rfl:    Fremanezumab -vfrm (AJOVY ) 225 MG/1.5ML SOAJ, Inject 225 mg into the skin every 28 (twenty-eight) days., Disp: 1.68 mL, Rfl: 11   levonorgestrel (MIRENA) 20 MCG/DAY IUD, 1 each by Intrauterine route once., Disp: , Rfl:    rizatriptan  (MAXALT ) 10 MG tablet, Take 1 tablet (10 mg total) by mouth as needed for migraine. May repeat in 2 hours if needed.  Maximum 2 tablets in 24 hours., Disp: 10 tablet, Rfl: 11   diltiazem  (CARDIZEM  CD) 240 MG 24 hr capsule, Take 1 capsule (240 mg total) by mouth daily., Disp: 90 capsule, Rfl: 0   potassium chloride  (KLOR-CON  M) 10 MEQ tablet, Take 1 tablet (10 mEq total) by mouth daily. (Patient not taking: Reported on 02/04/2024), Disp: 90 tablet, Rfl: 3   Objective:     BP 122/78 (  BP Location: Right Arm, Patient Position: Sitting, Cuff Size: Normal)   Pulse 99   Temp 97.6 F (36.4 C) (Temporal)   Ht 5' 4 (1.626 m)   Wt 165 lb 12.8 oz (75.2 kg)   SpO2 99%   BMI 28.46 kg/m  BP Readings from Last 3 Encounters:  02/04/24 122/78  08/26/23 125/80  03/16/23 120/70   Wt Readings from Last 3 Encounters:  02/04/24 165 lb 12.8 oz (75.2 kg)  08/26/23 164 lb (74.4 kg)  03/16/23 168 lb (76.2 kg)      Physical Exam Constitutional:      General: She is not in acute distress.    Appearance: Normal appearance. She is not ill-appearing, toxic-appearing or diaphoretic.  HENT:     Head: Normocephalic and atraumatic.     Right Ear: External ear normal.     Left Ear: External ear normal.     Mouth/Throat:     Mouth: Mucous membranes are moist.     Pharynx: Oropharynx is clear. No oropharyngeal exudate or posterior oropharyngeal  erythema.  Eyes:     General: No scleral icterus.       Right eye: No discharge.        Left eye: No discharge.     Extraocular Movements: Extraocular movements intact.     Conjunctiva/sclera: Conjunctivae normal.     Pupils: Pupils are equal, round, and reactive to light.  Cardiovascular:     Rate and Rhythm: Normal rate and regular rhythm.  Pulmonary:     Effort: Pulmonary effort is normal. No respiratory distress.     Breath sounds: Normal breath sounds.  Abdominal:     General: Bowel sounds are normal.  Musculoskeletal:     Cervical back: No rigidity or tenderness.  Skin:    General: Skin is warm and dry.  Neurological:     Mental Status: She is alert and oriented to person, place, and time.  Psychiatric:        Mood and Affect: Mood normal.        Behavior: Behavior normal.      No results found for any visits on 02/04/24.    The ASCVD Risk score (Arnett DK, et al., 2019) failed to calculate for the following reasons:   The 2019 ASCVD risk score is only valid for ages 70 to 39    Assessment & Plan:   Low TSH level -     TSH -     T3, free -     T4, free  Paroxysmal atrial fibrillation (HCC) -     dilTIAZem  HCl ER Coated Beads; Take 1 capsule (240 mg total) by mouth daily.  Dispense: 90 capsule; Refill: 0 -     Basic metabolic panel with GFR -     CBC -     Ambulatory referral to Cardiology  Anxiety -     Escitalopram Oxalate; Take 1 tablet (5 mg total) by mouth daily.  Dispense: 30 tablet; Refill: 2  Dysthymia -     Escitalopram Oxalate; Take 1 tablet (5 mg total) by mouth daily.  Dispense: 30 tablet; Refill: 2  Paroxysmal A-fib (HCC) -     dilTIAZem  HCl ER Coated Beads; Take 1 capsule (240 mg total) by mouth daily.  Dispense: 90 capsule; Refill: 0 -     Ambulatory referral to Cardiology  Sinus tachycardia -     dilTIAZem  HCl ER Coated Beads; Take 1 capsule (240 mg total) by mouth daily.  Dispense: 90 capsule;  Refill: 0 -     Ambulatory referral to  Cardiology  Immunization due -     Flu vaccine trivalent PF, 6mos and older(Flulaval,Afluria,Fluarix,Fluzone) -     HPV 9-valent vaccine,Recombinat -     Tdap vaccine greater than or equal to 7yo IM    Return in about 8 weeks (around 03/31/2024).  Start low-dose Lexapro.  Warned that effect will take time.  Information given on mindfulness based stress reduction  Elsie Sim Lent, MD

## 2024-02-07 ENCOUNTER — Ambulatory Visit: Payer: Self-pay | Admitting: Family Medicine

## 2024-02-17 ENCOUNTER — Ambulatory Visit: Admitting: Family Medicine

## 2024-02-17 ENCOUNTER — Other Ambulatory Visit

## 2024-02-23 ENCOUNTER — Other Ambulatory Visit: Payer: Self-pay | Admitting: Nurse Practitioner

## 2024-02-23 DIAGNOSIS — E041 Nontoxic single thyroid nodule: Secondary | ICD-10-CM

## 2024-02-24 ENCOUNTER — Inpatient Hospital Stay
Admission: RE | Admit: 2024-02-24 | Discharge: 2024-02-24 | Attending: Nurse Practitioner | Admitting: Nurse Practitioner

## 2024-02-24 DIAGNOSIS — E041 Nontoxic single thyroid nodule: Secondary | ICD-10-CM

## 2024-03-03 ENCOUNTER — Ambulatory Visit: Admitting: Neurology

## 2024-04-12 ENCOUNTER — Telehealth: Payer: Self-pay

## 2024-04-12 NOTE — Telephone Encounter (Signed)
 Pt is wanting an increase in medication dosage. Pt has appointment 05/05/24 with PCP and will discuss at visit

## 2024-05-05 ENCOUNTER — Ambulatory Visit: Admitting: Family Medicine

## 2024-05-09 ENCOUNTER — Other Ambulatory Visit: Payer: Self-pay | Admitting: Family Medicine

## 2024-05-09 DIAGNOSIS — F419 Anxiety disorder, unspecified: Secondary | ICD-10-CM

## 2024-05-09 DIAGNOSIS — F341 Dysthymic disorder: Secondary | ICD-10-CM
